# Patient Record
Sex: Female | Born: 1985 | Race: Black or African American | Hispanic: No | Marital: Single | Smoking: Current every day smoker
Health system: Southern US, Community
[De-identification: ages and names within clinical notes are randomized; demographics above are authoritative.]

## PROBLEM LIST (undated history)

## (undated) DIAGNOSIS — B379 Candidiasis, unspecified: Secondary | ICD-10-CM

## (undated) DIAGNOSIS — N898 Other specified noninflammatory disorders of vagina: Secondary | ICD-10-CM

## (undated) DIAGNOSIS — N921 Excessive and frequent menstruation with irregular cycle: Secondary | ICD-10-CM

## (undated) DIAGNOSIS — F419 Anxiety disorder, unspecified: Secondary | ICD-10-CM

## (undated) DIAGNOSIS — I2699 Other pulmonary embolism without acute cor pulmonale: Secondary | ICD-10-CM

## (undated) DIAGNOSIS — R760 Raised antibody titer: Secondary | ICD-10-CM

## (undated) HISTORY — DX: Other specified noninflammatory disorders of vagina: N89.8

## (undated) HISTORY — DX: Excessive and frequent menstruation with irregular cycle: N92.1

## (undated) HISTORY — DX: Candidiasis, unspecified: B37.9

## (undated) HISTORY — PX: WISDOM TOOTH EXTRACTION: SHX21

---
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
## 2004-06-09 ENCOUNTER — Emergency Department (HOSPITAL_COMMUNITY): Admission: EM | Admit: 2004-06-09 | Discharge: 2004-06-09 | Payer: Self-pay | Admitting: *Deleted

## 2004-07-28 ENCOUNTER — Emergency Department (HOSPITAL_COMMUNITY): Admission: EM | Admit: 2004-07-28 | Discharge: 2004-07-28 | Payer: Self-pay | Admitting: Emergency Medicine

## 2004-08-24 ENCOUNTER — Ambulatory Visit (HOSPITAL_COMMUNITY): Admission: RE | Admit: 2004-08-24 | Discharge: 2004-08-24 | Payer: Self-pay | Admitting: Obstetrics & Gynecology

## 2004-09-10 ENCOUNTER — Inpatient Hospital Stay (HOSPITAL_COMMUNITY): Admission: RE | Admit: 2004-09-10 | Discharge: 2004-09-12 | Payer: Self-pay | Admitting: Obstetrics & Gynecology

## 2005-09-06 ENCOUNTER — Emergency Department (HOSPITAL_COMMUNITY): Admission: EM | Admit: 2005-09-06 | Discharge: 2005-09-06 | Payer: Self-pay | Admitting: Emergency Medicine

## 2005-11-01 ENCOUNTER — Emergency Department (HOSPITAL_COMMUNITY): Admission: EM | Admit: 2005-11-01 | Discharge: 2005-11-02 | Payer: Self-pay | Admitting: *Deleted

## 2006-01-21 ENCOUNTER — Emergency Department (HOSPITAL_COMMUNITY): Admission: EM | Admit: 2006-01-21 | Discharge: 2006-01-21 | Payer: Self-pay | Admitting: Emergency Medicine

## 2006-03-16 ENCOUNTER — Emergency Department (HOSPITAL_COMMUNITY): Admission: EM | Admit: 2006-03-16 | Discharge: 2006-03-16 | Payer: Self-pay | Admitting: Emergency Medicine

## 2006-03-28 ENCOUNTER — Emergency Department (HOSPITAL_COMMUNITY): Admission: EM | Admit: 2006-03-28 | Discharge: 2006-03-28 | Payer: Self-pay | Admitting: Emergency Medicine

## 2006-07-04 ENCOUNTER — Emergency Department (HOSPITAL_COMMUNITY): Admission: EM | Admit: 2006-07-04 | Discharge: 2006-07-04 | Payer: Self-pay | Admitting: Emergency Medicine

## 2007-02-23 ENCOUNTER — Emergency Department (HOSPITAL_COMMUNITY): Admission: EM | Admit: 2007-02-23 | Discharge: 2007-02-23 | Payer: Self-pay | Admitting: Emergency Medicine

## 2007-09-12 ENCOUNTER — Other Ambulatory Visit: Admission: RE | Admit: 2007-09-12 | Discharge: 2007-09-12 | Payer: Self-pay | Admitting: Obstetrics and Gynecology

## 2008-07-03 ENCOUNTER — Emergency Department (HOSPITAL_COMMUNITY): Admission: EM | Admit: 2008-07-03 | Discharge: 2008-07-03 | Payer: Self-pay | Admitting: Emergency Medicine

## 2009-01-06 ENCOUNTER — Emergency Department (HOSPITAL_COMMUNITY): Admission: EM | Admit: 2009-01-06 | Discharge: 2009-01-06 | Payer: Self-pay | Admitting: Emergency Medicine

## 2009-12-13 ENCOUNTER — Emergency Department (HOSPITAL_COMMUNITY): Admission: EM | Admit: 2009-12-13 | Discharge: 2009-12-13 | Payer: Self-pay | Admitting: Emergency Medicine

## 2010-01-10 ENCOUNTER — Emergency Department (HOSPITAL_COMMUNITY): Admission: EM | Admit: 2010-01-10 | Discharge: 2010-01-10 | Payer: Self-pay | Admitting: Emergency Medicine

## 2010-03-10 ENCOUNTER — Emergency Department (HOSPITAL_COMMUNITY): Admission: EM | Admit: 2010-03-10 | Discharge: 2010-03-10 | Payer: Self-pay | Admitting: Emergency Medicine

## 2010-05-25 ENCOUNTER — Emergency Department (HOSPITAL_COMMUNITY)
Admission: EM | Admit: 2010-05-25 | Discharge: 2010-05-25 | Payer: Self-pay | Source: Home / Self Care | Admitting: Emergency Medicine

## 2010-08-19 ENCOUNTER — Emergency Department (HOSPITAL_COMMUNITY)
Admission: EM | Admit: 2010-08-19 | Discharge: 2010-08-19 | Disposition: A | Discharge status: Home / Self Care | Payer: Self-pay | Attending: Emergency Medicine | Admitting: Emergency Medicine

## 2010-08-19 DIAGNOSIS — R05 Cough: Secondary | ICD-10-CM | POA: Insufficient documentation

## 2010-08-19 DIAGNOSIS — R059 Cough, unspecified: Secondary | ICD-10-CM | POA: Insufficient documentation

## 2010-08-19 DIAGNOSIS — J019 Acute sinusitis, unspecified: Secondary | ICD-10-CM | POA: Insufficient documentation

## 2010-09-16 LAB — URINALYSIS, ROUTINE W REFLEX MICROSCOPIC
Glucose, UA: NEGATIVE mg/dL
Hgb urine dipstick: NEGATIVE
Leukocytes, UA: NEGATIVE
Nitrite: NEGATIVE
Protein, ur: 100 mg/dL — AB
Specific Gravity, Urine: >1.03 — ABNORMAL HIGH (ref 1.005–1.030)
Urobilinogen, UA: 1 mg/dL (ref 0.0–1.0)
pH: 5.5 (ref 5.0–8.0)

## 2010-09-16 LAB — URINE MICROSCOPIC-ADD ON

## 2010-09-16 LAB — PREGNANCY, URINE: Preg Test, Ur: NEGATIVE

## 2010-09-21 LAB — BASIC METABOLIC PANEL
Calcium: 9.1 mg/dL (ref 8.4–10.5)
GFR calc Af Amer: >60 mL/min (ref >60)
GFR calc non Af Amer: >60 mL/min (ref >60)
Glucose, Bld: 96 mg/dL (ref 70–99)
Sodium: 135 mEq/L (ref 135–145)

## 2010-09-21 LAB — DIFFERENTIAL
Basophils Relative: 1 % (ref 0–1)
Monocytes Relative: 5 % (ref 3–12)
Neutro Abs: 4.2 10*3/uL (ref 1.7–7.7)
Neutrophils Relative %: 65 % (ref 43–77)

## 2010-09-21 LAB — URINALYSIS, ROUTINE W REFLEX MICROSCOPIC
Nitrite: NEGATIVE
Specific Gravity, Urine: 1.025 (ref 1.005–1.030)
Urobilinogen, UA: 0.2 mg/dL (ref 0.0–1.0)

## 2010-09-21 LAB — POCT PREGNANCY, URINE: Preg Test, Ur: NEGATIVE

## 2010-09-21 LAB — URINE MICROSCOPIC-ADD ON

## 2010-09-21 LAB — URINE CULTURE: Colony Count: 45000

## 2010-09-21 LAB — CBC
Hemoglobin: 14.1 g/dL (ref 12.0–15.0)
MCHC: 33.5 g/dL (ref 30.0–36.0)
RBC: 4.72 MIL/uL (ref 3.87–5.11)

## 2010-09-21 LAB — ETHANOL: Alcohol, Ethyl (B): <5 mg/dL (ref 0–10)

## 2010-09-21 LAB — RAPID URINE DRUG SCREEN, HOSP PERFORMED: Tetrahydrocannabinol: POSITIVE — AB

## 2010-09-22 LAB — DIFFERENTIAL
Basophils Absolute: 0 10*3/uL (ref 0.0–0.1)
Basophils Relative: 1 % (ref 0–1)
Eosinophils Absolute: 0.1 10*3/uL (ref 0.0–0.7)
Eosinophils Relative: 1 % (ref 0–5)
Lymphocytes Relative: 37 % (ref 12–46)
Lymphs Abs: 2.4 10*3/uL (ref 0.7–4.0)
Monocytes Absolute: 0.3 10*3/uL (ref 0.1–1.0)
Monocytes Relative: 5 % (ref 3–12)
Neutro Abs: 3.6 10*3/uL (ref 1.7–7.7)
Neutrophils Relative %: 55 % (ref 43–77)

## 2010-09-22 LAB — POCT CARDIAC MARKERS: Myoglobin, poc: 90.7 ng/mL (ref 12–200)

## 2010-09-22 LAB — BASIC METABOLIC PANEL
BUN: 6 mg/dL (ref 6–23)
CO2: 26 mEq/L (ref 19–32)
Calcium: 9 mg/dL (ref 8.4–10.5)
Creatinine, Ser: 0.8 mg/dL (ref 0.4–1.2)
GFR calc Af Amer: >60 mL/min (ref >60)
Glucose, Bld: 81 mg/dL (ref 70–99)

## 2010-09-22 LAB — CBC
HCT: 39 % (ref 36.0–46.0)
Hemoglobin: 13.2 g/dL (ref 12.0–15.0)
MCHC: 33.8 g/dL (ref 30.0–36.0)
MCV: 88.7 fL (ref 78.0–100.0)
Platelets: 246 10*3/uL (ref 150–400)
RBC: 4.4 MIL/uL (ref 3.87–5.11)
RDW: 12.7 % (ref 11.5–15.5)
WBC: 6.4 10*3/uL (ref 4.0–10.5)

## 2010-09-22 LAB — D-DIMER, QUANTITATIVE: D-Dimer, Quant: 0.23 ug/mL-FEU (ref 0.00–0.48)

## 2010-10-20 ENCOUNTER — Emergency Department (HOSPITAL_COMMUNITY)
Admission: EM | Admit: 2010-10-20 | Discharge: 2010-10-20 | Discharge status: Against Medical Advice | Payer: Self-pay | Attending: Emergency Medicine | Admitting: Emergency Medicine

## 2010-10-20 DIAGNOSIS — R112 Nausea with vomiting, unspecified: Secondary | ICD-10-CM | POA: Insufficient documentation

## 2010-11-21 NOTE — Op Note (Signed)
NAME:  Holly Hodge, Holly Hodge                            ACCOUNT NO.:  [id number]   MEDICAL RECORD NO.:  [id number]                   PATIENT TYPE:  INP   LOCATION:  A410                                 FACILITY:  [facility]   PHYSICIAN:  Lazaro Arms, M.D.                DATE OF BIRTH:  1985-09-22   DATE OF PROCEDURE:  07/24/2003  DATE OF DISCHARGE:                                 OPERATIVE REPORT   PREOPERATIVE DIAGNOSES:  1. Intrauterine pregnancy at 40 weeks and 1 day.  2. Fetal macrosomia.  3. Unfavorable cervix with fetal vertex out of the pelvis.   POSTOPERATIVE DIAGNOSES:  1. Intrauterine pregnancy at 40 weeks and 1 day.  2. Fetal macrosomia.  3. Unfavorable cervix with fetal vertex out of the pelvis.   PROCEDURE:  Primary low transverse cesarean section.   SURGEON:  Lazaro Arms, M.D.   ANESTHESIA:  Spinal.   FINDINGS:  Over a low transverse hysterotomy incision was delivered a viable  female infant at 66 with Apgars of 8 and 9 weighing 11 pounds 1 ounce.  There is a  3-vessel cord.  Cord gas and cord blood were sent.  The placenta  was somewhat calcified.  It was sent to pathology because of fetal  macrosomia and calcifications.  The uterus tubes and ovaries were otherwise  normal. Upon delivering the infant there was a lot of bleeding in the lower  segment at a large venous complex; and actually made a small laceration in  the left temple approximately, 0.5 cm that was not bleeding.  Dr. Milford Cage is  simply going to Steri-Strip it.  I offered Dermabond, but he felt that it  was not necessary.   DESCRIPTION OF OPERATION:  The patient was taken to the operating room and  placed in the sitting position where she underwent a spinal anesthetic.  She  was then placed in the supine position with a roll under her right hip.  She  was prepped and draped in the usual sterile fashion.  A Foley catheter was  placed.  A Pfannenstiel skin incision was made and carried down sharply  through  the rectus fascia which scored in the midline and extended  laterally.  The fascia was taken off the muscles superiorly and inferiorly  without difficulty.  The muscles were divided, the peritoneal cavity was  entered and the bladder blade was placed.  The vesicouterine serosal flap  was created.   A low transverse hysterotomy incision made.  There was a large venous  complex found in this area and it was quite bloody.  Over this incision was  delivered a viable female infant at 54 with Apgars of 8 and 9, weighing 11  pounds and 1 ounce.  There was a 3-vessel cord.  Dr. Milford Cage was in attendance  for routine neonatal resuscitation.   The placenta was delivered spontaneously.  Cord gas  and cord blood were  sent.  The uterus was exteriorized and closed in two layers; the first being  a running interlocking layer and the second being an imbricating layer.  There was good hemostasis.  The pelvis was irrigated vigorously.  The  muscles were reapproximated loosely using #0  chromic.  The fascia was closed using #0 Vicryl.  The subcutaneous tissue  was made hemostatic and irrigated.  The skin was closed using skin staples.  The patient tolerated the procedure well.  She experienced 700 cc of blood  loss and was taken to the recovery room in good stable condition.  All  counts were correct x3.      ___________________________________________                                            Lazaro Arms, M.D.   LHE/MEDQ  D:  07/24/2003  T:  07/24/2003  Job:  [id number]

## 2010-11-21 NOTE — Op Note (Signed)
Holly Hodge, Holly Hodge                  ACCOUNT NO.:  [id number]   MEDICAL RECORD NO.:  [id number]          PATIENT TYPE:  INP   LOCATION:  A428                          FACILITY:  [facility]   PHYSICIAN:  Lazaro Arms, M.D.   DATE OF BIRTH:  1985-10-10   DATE OF PROCEDURE:  09/10/2004  DATE OF DISCHARGE:                                 OPERATIVE REPORT   PREOPERATIVE DIAGNOSIS:  1.  Intrauterine pregnancy at 39-2/[redacted] weeks gestation.  2.  Previous Cesarean section.   POSTOPERATIVE DIAGNOSIS:  1.  Intrauterine pregnancy at 39-2/[redacted] weeks gestation.  2.  Previous Cesarean section.   PROCEDURE:  Repeat Cesarean section, low transverse.   SURGEON:  Dr. Despina Hidden.   ANESTHESIA:  Spinal.   FINDINGS:  Over a low transverse hysterotomy incision was delivered a viable  female infant, Apgars of 9 and 9, weight to be determined in the nursery. I  used a vacuum extractor for assistance. There was three-vessel cord. Cord  blood and cord gases were sent. Placenta was normal and intact. Uterus,  tubes, and ovaries were normal.   DESCRIPTION OF OPERATION:  The patient was taken to the operating room,  placed in the sitting position where she underwent a spinal anesthetic. She  was then prepped and draped in the usual sterile fashion. A Pfannenstiel  skin incision was made and carried sharply to the rectus fascia which was  scored in the midline and extended laterally. The fascia was taken off the  muscles superiorly and inferiorly without difficulty. The muscles were  divided. Peritoneal cavity was entered, and bladder blade was placed. The  vesicouterine serosal flap was created. Low transverse hysterotomy incision  was made, and over this incision was delivered a viable female infant with  Apgars of 9 and 9 with weight to be determined in the nursery. The infant  was handed to Dr. Milford Cage who was in attendance for routine neonatal  resuscitation. Placenta was delivered intact. Cord blood and cord gases were  sent. The uterus, tubes, and ovaries were normal. The uterus was  exteriorized. Uterus was closed in 2 layers, first being a running  interlocking layer, the second being imbricating layer. There was good  hemostasis. The uterus was replaced in the peritoneal cavity. The pelvis was  irrigated vigorously with good hemostasis of all pedicles. The muscles were  reapproximated loosely, the fascia closed using a 0 PDS looped. Subcutaneous  tissue was made hemostatic and  irrigated. JP drain was placed. The subcu was brought together loosely with  plain suture. Skin was closed using skin staples. The patient tolerated the  procedure. She experienced 500 cc of blood loss. Was taken to the recovery  room in good, stable condition. All counts correct x3.      LHE/MEDQ  D:  09/10/2004  T:  09/10/2004  Job:  [id number]

## 2010-11-21 NOTE — H&P (Signed)
NAME:  Holly Hodge, Holly Hodge                            ACCOUNT NO.:  [id number]   MEDICAL RECORD NO.:  [id number]                  PATIENT TYPE:   LOCATION:                                       FACILITY:  [facility]   PHYSICIAN:  Lazaro Arms, M.D.                DATE OF BIRTH:  03-Oct-1985   DATE OF ADMISSION:  DATE OF DISCHARGE:                                HISTORY & PHYSICAL   She is 25 years old, gravida 1, para 0 with estimated date of delivery of  July 23, 2003 currently at 40 weeks 1 day gestation. Her cervix is long,  thick and closed and the baby is out of the pelvis.  We did an estimated  fetal weight here at [facility], last week, and it is over 4000  grams.  As a result she is not an induction candidate.  We offered her to  proceed with a cesarean section for a fetal macrosomia and she decided to do  that.  As a result she is admitted for primary C-section.   PAST MEDICAL HISTORY:  Past medical history is negative.   PAST SURGICAL HISTORY:  Past surgical history is negative.   PAST OBSTETRICAL HISTORY:  She is nulliparous.   ALLERGIES:  None.   MEDICATIONS:  Prenatal vitamins and iron.   Blood type is O positive, rubella is immune.  HIV is nonreactive.  Hepatitis  B is negative.  RPR is negative.  Pap was normal.  GC and Chlamydia were  normal.  Glucola was 87.  AFP was normal.   PHYSICAL EXAMINATION:  HEENT:  Unremarkable.  NECK:  Thyroid is normal.  LUNGS:  Clear.  HEART:  Regular rate and rhythm without murmur, regurgitation or gallop.  BREASTS:  Without masses or discharge or skin changes.  ABDOMEN:  Fundal height of 44 cm.  PELVIC:  Cervix long thick and closed.  Fetal vertex out of the pelvis.  EXTREMITIES:  Warm with no edema.   IMPRESSION:  1. Intrauterine pregnancy at 40+ weeks gestation.  2. Fetal macrosomia.  Estimated fetal weight of greater than 4000 grams.  3. Fetal vertex out of the pelvis, unfavorably cervix.   PLAN:  The patient admitted  for primary C-section.  She understands the  risks, benefits, indications and alternatives.  Will proceed.     ___________________________________________                                         Lazaro Arms, M.D.   Loraine Maple  D:  07/24/2003  T:  07/24/2003  Job:  [id number]

## 2010-11-21 NOTE — Discharge Summary (Signed)
Holly Hodge, Holly Hodge                  ACCOUNT NO.:  [id number]   MEDICAL RECORD NO.:  [id number]          PATIENT TYPE:  OIB   LOCATION:  LDR5                          FACILITY:  [facility]   PHYSICIAN:  Langley Gauss, MD     DATE OF BIRTH:  Dec 14, 1985   DATE OF ADMISSION:  08/24/2004  DATE OF DISCHARGE:  02/19/2006LH                                 DISCHARGE SUMMARY   HISTORY OF PRESENT ILLNESS:  A 25 year old gravida 2, para 1-1, prior lower  transverse cesarean section at about [redacted] weeks gestation, who is noted to  have episodes of nausea and vomiting due to post nasal drip.  The patient  states that she does have both a productive and non-productive cough, as  well as some sinus drainage and sinus tenderness.  She states it is after  she eats when she has vomiting and frequently vomits in the middle of the  night but she is convinced this is occurs in relationship to the post nasal  drip of phlegm.  She occasionally has some tightness in the chest and  wheezing, which rapidly clears with the productive cough.  The patient had  an appointment with [facility] the previous week for these upper  respiratory symptoms but was unable to keep the appointment.   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  Urine drug screen positive for marijuana.  One prior low transverse cesarean  section for an 11 pound 1 ounce infant.   PHYSICAL EXAMINATION:  GENERAL:  An obese black female in no acute distress.  It looks as though she has a very poor exercise tolerance.  ABDOMEN:  Soft and nontender with a fundal height of 40 cm.  She is vertex  presentation by Leopold's maneuvers.  LUNGS:  Noted to be clear.  CARDIOVASCULAR:  Regular rate and rhythm.   ASSESSMENT/PLAN:  The patient with an upper respiratory infection with post  nasal drainage resulting in some vomiting.  The patient is treated  symptomatically with Z-pack, as well as some Tussionex.  She is given  instructions to follow up with [facility] this week to assess for  improvement with this therapeutic regimen.   ADDENDUM:  Fetal heart tones are documented in the 140s-150s.  No uterine  activity by external toco.      DC/MEDQ  D:  08/24/2004  T:  08/25/2004  Job:  [id number]

## 2010-11-21 NOTE — H&P (Signed)
NAME:  Holly Hodge, Holly Hodge                            ACCOUNT NO.:  [id number]   MEDICAL RECORD NO.:  [id number]                   PATIENT TYPE:  AMB   LOCATION:  DAY                                  FACILITY:  [facility]   PHYSICIAN:  Dalia Heading, M.D.               DATE OF BIRTH:  08-Jul-1985   DATE OF ADMISSION:  DATE OF DISCHARGE:                                HISTORY & PHYSICAL   CHIEF COMPLAINT:  Biliary colic, cholelithiasis.   HISTORY OF PRESENT ILLNESS:  The patient is an 25 year old black female who  is referred for evaluation and treatment of biliary colic secondary to  cholelithiasis. She has been having intermittent episodes of right upper  quadrant abdominal pain with radiation to the right flank, nausea, and  bloating for many weeks. She does have fatty food intolerance. No fever,  chills, or jaundice have been noted. Her symptoms have been worsening.   PAST MEDICAL HISTORY:  Unremarkable.   PAST SURGICAL HISTORY:  Cesarean section by Dr. Despina Hidden in 2005.   CURRENT MEDICATIONS:  None.   ALLERGIES:  No known drug allergies.   REVIEW OF SYSTEMS:  The patient does smoke daily. She denies any alcohol  use.   PHYSICAL EXAMINATION:  GENERAL:  The patient is an obese, black female,  weighing 258 in no acute distress. She is afebrile, and vital signs are  stable.  HEENT:  Reveals no sclerae icterus.  LUNGS:  Clear to auscultation with equal breath sounds bilaterally.  HEART:  Reveals regular rate and rhythm without S3, S4, or murmurs.  ABDOMEN:  The abdomen is soft with slight tenderness in the right upper  quadrant to palpation. No hepatosplenomegaly, masses, or hernias are  identified.   STUDIES:  Ultrasound of the gallbladder reveals cholelithiasis.   IMPRESSION:  Biliary colic, cholelithiasis.   PLAN:  The patient is scheduled for laparoscopic cholecystectomy on December 26, 2003. The risks and benefits of the procedure including bleeding, infection,  hepatobiliary injury,  and the possibility of an open procedure were fully  explained to the patient who gave informed consent.     ___________________________________________                                         Dalia Heading, M.D.   MAJ/MEDQ  D:  12/25/2003  T:  12/25/2003  Job:  [id number]   cc:   Patrica Duel, M.D.  [address], [address]  [place]  [place] [address]  Fax: [phone]

## 2010-11-21 NOTE — Discharge Summary (Signed)
NAMEBUNNY, KLEIST                  ACCOUNT NO.:  [id number]   MEDICAL RECORD NO.:  [id number]          PATIENT TYPE:  INP   LOCATION:  A428                          FACILITY:  [facility]   PHYSICIAN:  Lazaro Arms, M.D.   DATE OF BIRTH:  1986-05-01   DATE OF ADMISSION:  09/10/2004  DATE OF DISCHARGE:  03/10/2006LH                                 DISCHARGE SUMMARY   DISCHARGE DIAGNOSES:  1.  Status post repeat cesarean section.  2.  Unremarkable postoperative course.   PROCEDURE:  Repeat cesarean section.   HOSPITAL COURSE:  Please refer to the handwritten History and Physical for  details of admission to the hospital.  Please refer to the Operative Note  for details of the surgery.  The patient was admitted after surgery. She  tolerated clear liquids and a regular diet.  She was without symptoms and  was ambulatory.  Her incision was clean, dry, and intact.  On postop day #1,  hemoglobin was 11.5, hematocrit 33.3, white count was normal at 11,000.  Her  abdominal exam was benign.  She was ambulatory.  She tolerated oral pain  medicine.  She was discharged to home on postop day #2 in good stable  condition to follow up in the office next week to have her staples removed.  Her incision was clean, dry, and intact.  She was given Tylox and Motrin for  pain.  Again, to follow up Wednesday.      LHE/MEDQ  D:  10/29/2004  T:  10/29/2004  Job:  [id number]

## 2010-11-21 NOTE — Op Note (Signed)
NAME:  Holly Hodge, Holly Hodge                            ACCOUNT NO.:  [id number]   MEDICAL RECORD NO.:  [id number]                   PATIENT TYPE:  AMB   LOCATION:  DAY                                  FACILITY:  [facility]   PHYSICIAN:  Dalia Heading, M.D.               DATE OF BIRTH:  19-Jun-1986   DATE OF PROCEDURE:  DATE OF DISCHARGE:                                 OPERATIVE REPORT   PREOPERATIVE DIAGNOSIS:  Cholecystitis, cholelithiasis.   POSTOPERATIVE DIAGNOSIS:  Cholecystitis, cholelithiasis.   PROCEDURE:  Laparoscopic cholecystectomy.   SURGEON:  Dalia Heading, M.D.   ASSISTANT:  Buena Irish, M.D.   ANESTHESIA:  General endotracheal.   INDICATIONS:  The patient is an 25 year old black female who presents with  cholecystitis secondary to cholelithiasis.  The risks and benefits of the  procedure including bleeding, infection, hepatobiliary injury, and the  possibility of an open procedure were fully explained to the patient, who  gave informed consent.   DESCRIPTION OF PROCEDURE:  The patient was placed in the supine position.  After induction of general endotracheal anesthesia, the abdomen was prepped  and draped using the usual sterile technique with Betadine.  Surgical site  confirmation was performed.   A supraumbilical incision was made down to the fascia.  A Veress needle was  introduced into the abdominal cavity and confirmation of placement was done  using the saline drop test.  The abdomen was then insufflated to 16 mmHg  pressure.  An 11-mm trocar was introduced into the abdominal cavity under  direct visualization without difficulty.  The patient was placed then in  reverse Trendelenburg position and an additional 11-mm trocar was placed in  the epigastric region and 5-mm trocars were placed in the right upper  quadrant and right flank regions. The liver was inspected and noted to be  within normal limits.   The gallbladder was retracted superiorly and  laterally.  The dissection was  begun around the infundibulum of the gallbladder.  The cystic duct was first  identified.  Its juncture to the infundibulum fully identified.  Endoclips  were placed proximally and distally on the cystic duct; and the cystic duct  was divided.  This was likewise done on the cystic artery.  The gallbladder  was then freed away from the gallbladder fossa using Bovie electrocautery.  The gallbladder was delivered through the epigastric trocar site using an  EndoCatch bag.  The gallbladder fossa was inspected and no abnormal bleeding  or bile leakage was noted.  Surgicel was placed in the gallbladder fossa.  All fluid and air were then evacuated from the abdominal cavity prior to  removal of the trocars.   All wounds were irrigated with normal saline.  All wounds were injected with  0.5% Sensorcaine.  The supraumbilical fascia was reapproximated using an #0  Vicryl interrupted suture. All skin incisions were closed using staples.  Betadine ointment and dry sterile dressings were applied.   All tape and needle counts were correct at the end of the procedure.  The  patient was extubated in the operating room and went back to recovery room  awake and stable condition.   COMPLICATIONS:  None.   SPECIMENS:  Gallbladder with stones.   BLOOD LOSS:  Minimal.      ___________________________________________                                            Dalia Heading, M.D.   MAJ/MEDQ  D:  12/26/2003  T:  12/27/2003  Job:  [id number]   cc:   Patrica Duel, M.D.  [address]  [place]  [place] [address]  Fax: [phone]

## 2010-11-21 NOTE — H&P (Signed)
NAMEARGIE, LOBER                  ACCOUNT NO.:  [id number]   MEDICAL RECORD NO.:  [id number]          PATIENT TYPE:  AMB   LOCATION:  DAY                           FACILITY:  [facility]   PHYSICIAN:  Lazaro Arms, M.D.   DATE OF BIRTH:  27-Aug-1985   DATE OF ADMISSION:  09/11/2004  DATE OF DISCHARGE:  LH                                HISTORY & PHYSICAL   Holly Hodge is a 25 year old African-American female, gravida 2, para 1, estimated  date of delivery of September 17, 2004, currently at 39-2/[redacted] weeks gestation  admitted for repeat C section.  We did a primary C section in 2005 without  labor because estimated fetal weight was greater than 4500 g and the fetus  was out of pelvis and weighed 11 pounds 1 ounce.  The patient is admitted  for repeat C section.   PAST MEDICAL HISTORY:  Negative.   PAST SURGICAL HISTORY:  1.  C section.  2.  Cholecystectomy.   PAST OB HISTORY:  As above.   ALLERGIES:  None,.   MEDICATIONS:  Prenatal vitamins.   LABORATORY DATA AND OTHER STUDIES:  Blood type is O positive.  Antibody  screen is negative.  Rubella is immune.  Hepatitis B is negative.  HIV was  negative.  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  HSV-2 was negative.  Serology was  nonreactive.  Pap was normal.  GC and chlamydia were both negative.  She was  too late for AFP.  Glucola was normal at 91.  Group B strep culture was  negative.   PHYSICAL EXAMINATION:  HEENT:  Unremarkable.  NECK: Thyroid normal.  LUNGS:  Clear.  HEART:  Regular rate and rhythm without murmur, regurgitation, or gallop.  BREASTS:  Without mass, discharge, skin changes.  ABDOMEN:  Fundal height 43 cm.  PELVIC:  Cervix long, thick, and closed  EXTREMITIES:  Warm with no edema.  NEUROLOGIC:  Grossly intact.   IMPRESSION:  1.  Intrauterine pregnancy at 39+ weeks gestation.  2.  Previous cesarean section.   PLAN:  The patient is admitted for repeat cesarean section.  She understands  the risks, benefits, indications, and alternatives and  will proceed.      LHE/MEDQ  D:  09/09/2004  T:  09/09/2004  Job:  [id number]

## 2010-11-21 NOTE — Procedures (Signed)
Holly Hodge, OLIVEIRA                  ACCOUNT NO.:  [id number]   MEDICAL RECORD NO.:  [id number]          PATIENT TYPE:  EMS   LOCATION:  ED                            FACILITY:  [facility]   PHYSICIAN:  Edward L. Juanetta Gosling, M.D.DATE OF BIRTH:  [birth year]   DATE OF PROCEDURE:  11/01/2005  DATE OF DISCHARGE:  11/01/2005                                EKG INTERPRETATION   2107.  The rhythm is a supraventricular bigeminy.  There is left ventricular  hypertrophy.  T-wave abnormalities are seen anteriorly which could indicate  ischemia, but are more likely related to LVH.  Abnormal electrocardiogram.      Oneal Deputy. Juanetta Gosling, M.D.  Electronically Signed     ELH/MEDQ  D:  11/02/2005  T:  11/03/2005  Job:  [id number]

## 2011-06-02 ENCOUNTER — Emergency Department (HOSPITAL_COMMUNITY)
Admission: EM | Admit: 2011-06-02 | Discharge: 2011-06-02 | Disposition: A | Discharge status: Home / Self Care | Payer: Medicaid Other | Attending: Emergency Medicine | Admitting: Emergency Medicine

## 2011-06-02 ENCOUNTER — Encounter: Payer: Self-pay | Admitting: Emergency Medicine

## 2011-06-02 DIAGNOSIS — K089 Disorder of teeth and supporting structures, unspecified: Secondary | ICD-10-CM | POA: Insufficient documentation

## 2011-06-02 DIAGNOSIS — K0889 Other specified disorders of teeth and supporting structures: Secondary | ICD-10-CM

## 2011-06-02 DIAGNOSIS — F17200 Nicotine dependence, unspecified, uncomplicated: Secondary | ICD-10-CM | POA: Insufficient documentation

## 2011-06-02 DIAGNOSIS — R51 Headache: Secondary | ICD-10-CM | POA: Insufficient documentation

## 2011-06-02 HISTORY — DX: Anxiety disorder, unspecified: F41.9

## 2011-06-02 MED ORDER — ISOMETHEPTENE-APAP-DICHLORAL 65-325-100 MG PO CAPS: 1.0000 | ORAL_CAPSULE | Freq: Four times a day (QID) | ORAL | Status: AC | PRN

## 2011-06-02 MED ORDER — NAPROXEN 500 MG PO TABS: 500.0000 mg | ORAL_TABLET | Freq: Two times a day (BID) | ORAL | Status: DC

## 2011-06-02 NOTE — ED Provider Notes (Signed)
History  This chart was scribed for Holly Kras, MD by Bennett Scrape. This patient was seen in room APA11/APA11 and the patient's care was started at 7:45AM.  CSN: [id number] Arrival date & time: 06/02/2011  7:42 AM   First MD Initiated Contact with Patient 06/02/11 0732      Chief Complaint  Patient presents with  . Headache  . Dental Pain    HPI  Holly Hodge is a 25 y.o. female who presents to the Emergency Department complaining of 3 days of a gradual onset, constant moderate headache. Pt has not seen anyone for the headache and reports not taking any pain medication because "they usually don't help". Pt also complains of 2 hours of a mild dental pain on her right side that she feels may be related to the headache. Pt reports having a h/o headaches. She denies no numbness and weakness, fevers and vomiting. Pt states that her sister has a h/o migraines. Nothing seems to make the headache particularly worse.  Past Medical History  Diagnosis Date  . Anxiety     Past Surgical History  Procedure Date  . Cholecystectomy   . Cesarean section     x2    Family History  Problem Relation Age of Onset  . Hypertension Mother   . Hypertension Father   . Stroke Father   . Cancer Other     History  Substance Use Topics  . Smoking status: Current Everyday Smoker -- 0.5 packs/day for 6 years    Types: Cigarettes  . Smokeless tobacco: Never Used  . Alcohol Use: No     occasionally-holidays    OB History    Grav Para Term Preterm Abortions TAB SAB Ect Mult Living   2 2 2       2       Review of Systems A complete 10 system review of systems was obtained and is otherwise negative except as noted in the HPI .   Allergies  Review of patient's allergies indicates no known allergies.  Home Medications  No current outpatient prescriptions on file.  Triage Vitals: BP 131/72  Pulse 84  Resp 16  Ht 5\' 5"  (1.651 m)  Wt 234 lb (106.142 kg)  BMI 38.94 kg/m2  SpO2 99%  LMP  04/25/2011  Physical Exam  Nursing note and vitals reviewed. Constitutional: She is oriented to person, place, and time. She appears well-developed and well-nourished. No distress.  HENT:  Head: Normocephalic and atraumatic.  Right Ear: External ear normal.  Left Ear: External ear normal.  Mouth/Throat: Oropharynx is clear and moist.       Normal dentition, mild tenderness to the right mandible  Eyes: Conjunctivae are normal. Right eye exhibits no discharge. Left eye exhibits no discharge. No scleral icterus.  Neck: Neck supple. No tracheal deviation present.  Cardiovascular: Normal rate, regular rhythm and intact distal pulses.   Pulmonary/Chest: Effort normal and breath sounds normal. No stridor. No respiratory distress. She has no wheezes. She has no rales.  Abdominal: Soft. Bowel sounds are normal. She exhibits no distension. There is no tenderness. There is no rebound and no guarding.  Musculoskeletal: She exhibits no edema and no tenderness.       Good strength in all extremities.  Neurological: She is alert and oriented to person, place, and time. She has normal strength. No sensory deficit. Cranial nerve deficit:  no gross defecits noted. She exhibits normal muscle tone. She displays no seizure activity. Coordination normal.  No pronator drift bilateral upper extrem, able to hold both legs off bed for 5 seconds, sensation intact in all extremities, no visual field cuts, no left or right sided neglect  Skin: Skin is warm and dry. No rash noted.  Psychiatric: She has a normal mood and affect.    ED Course  Procedures (including critical care time)  DIAGNOSTIC STUDIES: Oxygen Saturation is 99% on room air, normal by my interpretation.    COORDINATION OF CARE: 7:48AM-Discussed treatment plan with patient at bedside and patient agreed to plan.   Labs Reviewed - No data to display No results found.   1. Toothache   2. Headache       MDM  Patient with moderate  headache. There are no findings to suggest stroke, hemorrhage, tumor. Patient is a mild dental pain. I do not see any obvious dental abnormality I have encouraged her to follow up with dentist. The patient does have history of headache she could have migraines. I will prescribe Naprosyn and Midrin.  Patient followup with primary-care doctor. Discharge junctions include warning signs that should prompt return to emergency department      I personally performed the services described in this documentation, which was scribed in my presence.  The recorded information has been reviewed and considered.   Holly Kras, MD 06/02/11 [phone]

## 2011-06-02 NOTE — ED Notes (Signed)
Patient c/o headache since Saturday.  Per patient nervous with blurred vision. Patient also c/o right side dental pain that started this morning. Denies any nausea or vomiting.

## 2011-07-07 DIAGNOSIS — I2699 Other pulmonary embolism without acute cor pulmonale: Secondary | ICD-10-CM

## 2011-07-07 HISTORY — DX: Other pulmonary embolism without acute cor pulmonale: I26.99

## 2011-10-06 ENCOUNTER — Encounter (HOSPITAL_COMMUNITY): Payer: Self-pay | Admitting: Emergency Medicine

## 2011-10-06 ENCOUNTER — Emergency Department (HOSPITAL_COMMUNITY)
Admission: EM | Admit: 2011-10-06 | Discharge: 2011-10-06 | Disposition: A | Discharge status: Home / Self Care | Payer: Medicaid Other | Attending: Emergency Medicine | Admitting: Emergency Medicine

## 2011-10-06 DIAGNOSIS — F17200 Nicotine dependence, unspecified, uncomplicated: Secondary | ICD-10-CM | POA: Insufficient documentation

## 2011-10-06 DIAGNOSIS — K089 Disorder of teeth and supporting structures, unspecified: Secondary | ICD-10-CM | POA: Insufficient documentation

## 2011-10-06 DIAGNOSIS — K0889 Other specified disorders of teeth and supporting structures: Secondary | ICD-10-CM

## 2011-10-06 MED ORDER — IBUPROFEN 800 MG PO TABS
800.0000 mg | ORAL_TABLET | Freq: Once | ORAL | Status: AC
  Administered 2011-10-06: 800 mg via ORAL
  Filled 2011-10-06: qty 1

## 2011-10-06 MED ORDER — HYDROCODONE-ACETAMINOPHEN 5-325 MG PO TABS: 1.0000 | ORAL_TABLET | Freq: Four times a day (QID) | ORAL | Status: AC | PRN

## 2011-10-06 MED ORDER — PENICILLIN V POTASSIUM 500 MG PO TABS: 500.0000 mg | ORAL_TABLET | Freq: Four times a day (QID) | ORAL | Status: AC

## 2011-10-06 MED ORDER — PENICILLIN V POTASSIUM 250 MG PO TABS
500.0000 mg | ORAL_TABLET | Freq: Once | ORAL | Status: AC
  Administered 2011-10-06: 500 mg via ORAL
  Filled 2011-10-06: qty 2

## 2011-10-06 NOTE — ED Notes (Signed)
Pt c/o bilateral lower toothaches

## 2011-10-06 NOTE — ED Provider Notes (Signed)
History     CSN: [id number]  Arrival date & time 10/06/11  1028   First MD Initiated Contact with Patient 10/06/11 1035      Chief Complaint  Patient presents with  . Dental Pain    (Consider location/radiation/quality/duration/timing/severity/associated sxs/prior treatment) HPI Comments: Seen by her dentist 1 week ago and had cleaning.  Has another appt 10-18-11 for "deeper cleaning".  Her x-rays reportedly revealed the need for excision of all her wisdom teeth and her dentist is going to refer to an oral surgeon.  Patient is a 26 y.o. female presenting with tooth pain. The history is provided by the patient. No language interpreter was used.  Dental PainThe primary symptoms include mouth pain. Primary symptoms do not include fever. The symptoms began more than 1 week ago. The symptoms are worsening. The symptoms occur constantly.  Additional symptoms do not include: facial swelling.    Past Medical History  Diagnosis Date  . Anxiety     Past Surgical History  Procedure Date  . Cholecystectomy   . Cesarean section     x2    Family History  Problem Relation Age of Onset  . Hypertension Mother   . Hypertension Father   . Stroke Father   . Cancer Other     History  Substance Use Topics  . Smoking status: Current Everyday Smoker -- 0.5 packs/day for 6 years    Types: Cigarettes  . Smokeless tobacco: Never Used  . Alcohol Use: No     occasionally-holidays    OB History    Grav Para Term Preterm Abortions TAB SAB Ect Mult Living   2 2 2       2       Review of Systems  Constitutional: Negative for fever.  HENT: Positive for dental problem. Negative for facial swelling.   All other systems reviewed and are negative.    Allergies  Review of patient's allergies indicates no known allergies.  Home Medications   Current Outpatient Rx  Name Route Sig Dispense Refill  . HYDROCODONE-ACETAMINOPHEN 5-325 MG PO TABS Oral Take 1 tablet by mouth every 6 (six) hours  as needed for pain. 20 tablet 0  . NAPROXEN 500 MG PO TABS Oral Take 1 tablet (500 mg total) by mouth 2 (two) times daily. 30 tablet 0  . PENICILLIN V POTASSIUM 500 MG PO TABS Oral Take 1 tablet (500 mg total) by mouth 4 (four) times daily. 40 tablet 0    BP 112/67  Pulse 77  Temp 98.9 F (37.2 C)  Resp 20  Ht 5\' 4"  (1.626 m)  Wt 232 lb (105.235 kg)  BMI 39.82 kg/m2  SpO2 100%  LMP 09/09/2011  Physical Exam  Nursing note and vitals reviewed. Constitutional: She is oriented to person, place, and time. She appears well-developed and well-nourished. No distress.  HENT:  Head: Normocephalic and atraumatic.  Mouth/Throat: Uvula is midline, oropharynx is clear and moist and mucous membranes are normal. Normal dentition. No dental abscesses, uvula swelling or dental caries.       Dentition appears normal and teeth in good repair.  Pt localizes pain to all wisdom teeth  Eyes: EOM are normal.  Neck: Normal range of motion.  Cardiovascular: Normal rate, regular rhythm and normal heart sounds.   Pulmonary/Chest: Effort normal and breath sounds normal.  Abdominal: Soft. She exhibits no distension. There is no tenderness.  Musculoskeletal: Normal range of motion.  Neurological: She is alert and oriented to person, place, and time.  Skin: Skin is warm and dry.  Psychiatric: She has a normal mood and affect. Judgment normal.    ED Course  Procedures (including critical care time)  Labs Reviewed - No data to display No results found.   1. Pain, dental       MDM  rx-hydrocodone rx-pen VK, 40 OTC ibuprofen 800 TID F/u with dentist ASAP        Worthy Rancher, PA 10/06/11 1103  Worthy Rancher, [place] 10/06/11 1106

## 2011-10-06 NOTE — ED Provider Notes (Signed)
History/physical exam/procedure(s) were performed by non-physician practitioner and as supervising physician I was immediately available for consultation/collaboration. I have reviewed all notes and am in agreement with care and plan.   Hilario Quarry, MD 10/06/11 [phone]

## 2011-10-06 NOTE — ED Notes (Signed)
Pt presents with bilateral lower wisdom teeth pain x 3 weeks. Pt states has a dentist and is waiting a referral to an oral surgeon.  Denies fever and inability to eat. Minimal bilateral swelling noted in lower jaw and neck.

## 2011-10-06 NOTE — Discharge Instructions (Signed)
Dental Pain A tooth ache may be caused by cavities (tooth decay). Cavities expose the nerve of the tooth to air and hot or cold temperatures. It may come from an infection or abscess (also called a boil or furuncle) around your tooth. It is also often caused by dental caries (tooth decay). This causes the pain you are having. DIAGNOSIS  Your caregiver can diagnose this problem by exam. TREATMENT   If caused by an infection, it may be treated with medications which kill germs (antibiotics) and pain medications as prescribed by your caregiver. Take medications as directed.   Only take over-the-counter or prescription medicines for pain, discomfort, or fever as directed by your caregiver.   Whether the tooth ache today is caused by infection or dental disease, you should see your dentist as soon as possible for further care.  SEEK MEDICAL CARE IF: The exam and treatment you received today has been provided on an emergency basis only. This is not a substitute for complete medical or dental care. If your problem worsens or new problems (symptoms) appear, and you are unable to meet with your dentist, call or return to this location. SEEK IMMEDIATE MEDICAL CARE IF:   You have a fever.   You develop redness and swelling of your face, jaw, or neck.   You are unable to open your mouth.   You have severe pain uncontrolled by pain medicine.  MAKE SURE YOU:   Understand these instructions.   Will watch your condition.   Will get help right away if you are not doing well or get worse.  Document Released: 06/22/2005 Document Revised: 06/11/2011 Document Reviewed: 02/08/2008 [facility] Patient Information 2012 [place], [place].  Take the meds as directed.  Take ibuprofen 800 mg every 8 hrs with food.  Follow up with your dentist ASAP.

## 2011-12-12 ENCOUNTER — Encounter (HOSPITAL_COMMUNITY): Payer: Self-pay

## 2011-12-12 ENCOUNTER — Emergency Department (HOSPITAL_COMMUNITY)
Admission: EM | Admit: 2011-12-12 | Discharge: 2011-12-12 | Disposition: A | Discharge status: Home / Self Care | Payer: Medicaid Other | Attending: Emergency Medicine | Admitting: Emergency Medicine

## 2011-12-12 ENCOUNTER — Emergency Department (HOSPITAL_COMMUNITY): Payer: Medicaid Other

## 2011-12-12 DIAGNOSIS — F17200 Nicotine dependence, unspecified, uncomplicated: Secondary | ICD-10-CM | POA: Insufficient documentation

## 2011-12-12 DIAGNOSIS — R1110 Vomiting, unspecified: Secondary | ICD-10-CM | POA: Insufficient documentation

## 2011-12-12 DIAGNOSIS — R110 Nausea: Secondary | ICD-10-CM

## 2011-12-12 DIAGNOSIS — R109 Unspecified abdominal pain: Secondary | ICD-10-CM

## 2011-12-12 LAB — CBC
Hemoglobin: 13.5 g/dL (ref 12.0–15.0)
MCH: 30.1 pg (ref 26.0–34.0)
MCV: 87.5 fL (ref 78.0–100.0)
RBC: 4.49 MIL/uL (ref 3.87–5.11)
WBC: 7.4 10*3/uL (ref 4.0–10.5)

## 2011-12-12 LAB — COMPREHENSIVE METABOLIC PANEL
ALT: 61 U/L — ABNORMAL HIGH (ref 0–35)
AST: 20 U/L (ref 0–37)
CO2: 23 mEq/L (ref 19–32)
Calcium: 9.9 mg/dL (ref 8.4–10.5)
Chloride: 101 mEq/L (ref 96–112)
GFR calc non Af Amer: 85 mL/min — ABNORMAL LOW (ref >90)
Sodium: 133 mEq/L — ABNORMAL LOW (ref 135–145)
Total Bilirubin: 0.2 mg/dL — ABNORMAL LOW (ref 0.3–1.2)

## 2011-12-12 LAB — URINALYSIS, ROUTINE W REFLEX MICROSCOPIC
Bilirubin Urine: NEGATIVE
Glucose, UA: NEGATIVE mg/dL
Ketones, ur: NEGATIVE mg/dL
Leukocytes, UA: NEGATIVE
Specific Gravity, Urine: 1.025 (ref 1.005–1.030)
pH: 6 (ref 5.0–8.0)

## 2011-12-12 MED ORDER — FAMOTIDINE 20 MG PO TABS
40.0000 mg | ORAL_TABLET | Freq: Once | ORAL | Status: AC
  Administered 2011-12-12: 40 mg via ORAL
  Filled 2011-12-12: qty 2

## 2011-12-12 MED ORDER — GI COCKTAIL ~~LOC~~
30.0000 mL | Freq: Once | ORAL | Status: AC
  Administered 2011-12-12: 30 mL via ORAL
  Filled 2011-12-12: qty 30

## 2011-12-12 MED ORDER — ONDANSETRON HCL 4 MG PO TABS: 4.0000 mg | ORAL_TABLET | Freq: Three times a day (TID) | ORAL | Status: AC | PRN

## 2011-12-12 NOTE — ED Notes (Signed)
Complaining of vomiting and bouts of constipation

## 2011-12-12 NOTE — ED Provider Notes (Signed)
History   This chart was scribed for Holly Anger, DO scribed by [employer]. The patient was seen in room APA19/APA19 seen at 16:22   CSN: [id number]  Arrival date & time 12/12/11  1546   First MD Initiated Contact with Patient 12/12/11 1615      Chief Complaint  Patient presents with  . Emesis     HPI Holly Hodge is a 26 y.o. female who presents to the Emergency Department complaining of gradual onset and persistence of constant upper abd "pain" since yesterday.  Has been associated with nausea.  Describes the pain as "pressure" in her upper abd area that makes her feel like she has to urinate.  Denies dysuria/hematuria, no vaginal bleeding/discharge, no CP/SOB, no cough, no vomiting/diarrhea, no back/flank pain, no fevers, no rash.    Past Medical History  Diagnosis Date  . Anxiety     Past Surgical History  Procedure Date  . Cesarean section     x2  . Cholecystectomy 2005    Family History  Problem Relation Age of Onset  . Hypertension Mother   . Hypertension Father   . Stroke Father   . Cancer Other     History  Substance Use Topics  . Smoking status: Current Everyday Smoker -- 0.5 packs/day for 6 years    Types: Cigarettes  . Smokeless tobacco: Never Used  . Alcohol Use: No     occasionally-holidays    OB History    Grav Para Term Preterm Abortions TAB SAB Ect Mult Living   2 2 2       2       Review of Systems ROS: Statement: All systems negative except as marked or noted in the HPI; Constitutional: Negative for fever and chills. ; ; Eyes: Negative for eye pain, redness and discharge. ; ; ENMT: Negative for ear pain, hoarseness, nasal congestion, sinus pressure and sore throat. ; ; Cardiovascular: Negative for chest pain, palpitations, diaphoresis, dyspnea and peripheral edema. ; ; Respiratory: Negative for cough, wheezing and stridor. ; ; Gastrointestinal: +nausea, abd pain. Negative for vomiting, diarrhea, blood in stool, hematemesis, jaundice  and rectal bleeding. . ; ; Genitourinary: Negative for dysuria, flank pain and hematuria.; GYN:  No vaginal bleeding, no vaginal discharge, no vulvar pain.; ; Musculoskeletal: Negative for back pain and neck pain. Negative for swelling and trauma.; ; Skin: Negative for pruritus, rash, abrasions, blisters, bruising and skin lesion.; ; Neuro: Negative for headache, lightheadedness and neck stiffness. Negative for weakness, altered level of consciousness , altered mental status, extremity weakness, paresthesias, involuntary movement, seizure and syncope.     Allergies  Review of patient's allergies indicates no known allergies.  Home Medications   Current Outpatient Rx  Name Route Sig Dispense Refill  . NORETHINDRONE-ETH ESTRADIOL 0.4-35 MG-MCG PO TABS Oral Take 1 tablet by mouth daily.      BP 112/67  Pulse 78  Temp(Src) 98.8 F (37.1 C) (Oral)  Resp 20  Ht 5\' 4"  (1.626 m)  Wt 228 lb (103.42 kg)  BMI 39.14 kg/m2  SpO2 96%  LMP 12/09/2011  Physical Exam 1620: Physical examination:  Nursing notes reviewed; Vital signs and O2 SAT reviewed;  Constitutional: Well developed, Well nourished, Well hydrated, In no acute distress; Head:  Normocephalic, atraumatic; Eyes: EOMI, PERRL, No scleral icterus; ENMT: Mouth and pharynx normal, Mucous membranes moist; Neck: Supple, Full range of motion, No lymphadenopathy; Cardiovascular: Regular rate and rhythm, No murmur, rub, or gallop; Respiratory: Breath sounds clear &  equal bilaterally, No rales, rhonchi, wheezes, speaking full sentences with ease.  Normal respiratory effort/excursion; Chest: Nontender, Movement normal; Abdomen: Soft, +very mild mid-epigastric tenderness to palp, no rebound or guarding. Nondistended, Normal bowel sounds; Genitourinary: No CVA tenderness; Extremities: Pulses normal, No tenderness, No edema, No calf edema or asymmetry.; Neuro: AA&Ox3, Major CN grossly intact.  No gross focal motor or sensory deficits in extremities.; Skin:  Color normal, Warm, Dry.   ED Course  Procedures    MDM  MDM Reviewed: nursing note and vitals Interpretation: labs and x-ray   Results for orders placed during the hospital encounter of 12/12/11  URINALYSIS, ROUTINE W REFLEX MICROSCOPIC      Component Value Range   Color, Urine YELLOW  YELLOW    APPearance HAZY (*) CLEAR    Specific Gravity, Urine 1.025  1.005 - 1.030    pH 6.0  5.0 - 8.0    Glucose, UA NEGATIVE  NEGATIVE (mg/dL)   Hgb urine dipstick NEGATIVE  NEGATIVE    Bilirubin Urine NEGATIVE  NEGATIVE    Ketones, ur NEGATIVE  NEGATIVE (mg/dL)   Protein, ur NEGATIVE  NEGATIVE (mg/dL)   Urobilinogen, UA 0.2  0.0 - 1.0 (mg/dL)   Nitrite NEGATIVE  NEGATIVE    Leukocytes, UA NEGATIVE  NEGATIVE   PREGNANCY, URINE      Component Value Range   Preg Test, Ur NEGATIVE  NEGATIVE   CBC      Component Value Range   WBC 7.4  4.0 - 10.5 (K/uL)   RBC 4.49  3.87 - 5.11 (MIL/uL)   Hemoglobin 13.5  12.0 - 15.0 (g/dL)   HCT 16.1  09.6 - 04.5 (%)   MCV 87.5  78.0 - 100.0 (fL)   MCH 30.1  26.0 - 34.0 (pg)   MCHC 34.4  30.0 - 36.0 (g/dL)   RDW 40.9  81.1 - 91.4 (%)   Platelets 272  150 - 400 (K/uL)  COMPREHENSIVE METABOLIC PANEL      Component Value Range   Sodium 133 (*) 135 - 145 (mEq/L)   Potassium 3.9  3.5 - 5.1 (mEq/L)   Chloride 101  96 - 112 (mEq/L)   CO2 23  19 - 32 (mEq/L)   Glucose, Bld 86  70 - 99 (mg/dL)   BUN 9  6 - 23 (mg/dL)   Creatinine, Ser 7.82  0.50 - 1.10 (mg/dL)   Calcium 9.9  8.4 - 95.6 (mg/dL)   Total Protein 7.5  6.0 - 8.3 (g/dL)   Albumin 3.7  3.5 - 5.2 (g/dL)   AST 20  0 - 37 (U/L)   ALT 61 (*) 0 - 35 (U/L)   Alkaline Phosphatase 71  39 - 117 (U/L)   Total Bilirubin 0.2 (*) 0.3 - 1.2 (mg/dL)   GFR calc non Af Amer 85 (*) >90 (mL/min)   GFR calc Af Amer >90  >90 (mL/min)  LIPASE, BLOOD      Component Value Range   Lipase 23  11 - 59 (U/L)    Dg Abd Acute W/chest 12/12/2011  *RADIOLOGY REPORT*  Clinical Data: Epigastric pain, lower pelvic pain   ACUTE ABDOMEN SERIES (ABDOMEN 2 VIEW & CHEST 1 VIEW)  Comparison: None.  Findings: Cardiomediastinal silhouette is stable. No acute infiltrate or pleural effusion.  No pulmonary edema. There is nonspecific nonobstructive bowel gas pattern.  No free abdominal air.  IMPRESSION: No active disease.  Nonspecific nonobstructive bowel gas pattern. No free abdominal air.  Original Report Authenticated By: Lang Snow POP,  M.D.     6:44 PM:  Mild ALT elevation, no old to compare.  WBC, Tbili, lipase all normal; and pt is already s/p cholecystectomy in 2005.  VSS, afebrile.  Has tol PO well while in ED without N/V.  No stooling while in ED.  Wants to go home now.  Dx testing d/w pt.  Questions answered.  Verb understanding, agreeable to d/c home with outpt f/u.             I personally performed the services described in this documentation, which was scribed in my presence. The recorded information has been reviewed and considered. Everton Bertha Allison Quarry, DO 12/14/11 1803

## 2011-12-12 NOTE — ED Notes (Signed)
States she does not feel she will be able to work [employer]

## 2011-12-12 NOTE — Discharge Instructions (Signed)
RESOURCE GUIDE  Chronic Pain Problems: Contact [facility]  [phone] Patients need to be referred by their primary care doctor.  Insufficient Money for Medicine: Contact United Way:  call "211" or [facility] [phone].  No Primary Care Doctor: - Call [facility]  [phone] - can help you locate a primary care doctor that  accepts your insurance, provides certain services, etc. - Physician Referral Service- 1-800-533-3463  Agencies that provide inexpensive medical care: - [facility]  [phone] - [facility]  [phone] - [facility]  [phone] - [facility]  [phone] - Planned Parenthood  [phone] - [facility]  [phone]  Medicaid-accepting [place] Providers: - [facility]- [address]  [phone], Mon-Fri 9am-7pm, Sat 9am-1pm - [facility]- [address], [address]  [phone] - [facility]- [address], [address]  [phone] - [facility]- [address] [place] [address]  [phone] - Veita Bland- [address], [phone]  Only accepts [place] Access Medicaid patients after they have their name  applied to their card  Self Pay (no insurance) in [place]: - Sickle Cell Patients: Dr Eric Dean, [facility]  [address], [phone] - [facility]- [address]  [phone]       -     [facility]- [address]       -     [facility]- see information above (Speak to Pam H if you do not have insurance)       -  [facility]- [address], [phone]       -  [facility] [place]- [address],  [phone]       -  [facility]- [address] [place] [address], [phone]       -  Dr Osei-Bonsu-  [address], [place], [phone]       -  [facility]- [address], [phone]       -  [facility]- [address] [place] [address], [phone], also [address], [phone]       -    [facility]- [address], [phone], 1st & 3rd Saturday   every month, 10am-1pm  1) Find a Doctor and Pay Out of Pocket Although you won't have to find out who is covered by your insurance plan, it is a good idea to ask around and get recommendations. You will then need to call the office and see if the doctor you have chosen will accept you as a new patient and what types of options they offer for patients who are self-pay. Some doctors offer discounts or will set up payment plans for their patients who do not have insurance, but you will need to ask so you aren't surprised when you get to your appointment.  2) Contact Your Local Health Department Not all health departments have doctors that can see patients for sick visits, but many do, so it is worth a call to see if yours does. If you don't know where your local health department is, you can check in your phone book. The CDC also has a tool to help you locate your state's health department, and many state websites also have   listings of all of their local health departments.  3) Find a Walk-in Clinic If your illness is not likely to be very severe or complicated, you may want to try a walk in clinic. These are popping up all over the country in pharmacies, drugstores, and shopping centers. They're usually staffed by nurse practitioners or physician assistants that have been trained to treat common illnesses and complaints. They're usually fairly quick and inexpensive. However, if you have serious medical issues or chronic medical problems, these are probably not your best option  STD Testing - [place] [facility] [place], [facility], [address], [place], phone [phone] or 1-877-539-9860.  Monday - Friday, call for an appointment. - [place]  [facility] [place], [facility] Clinic, [address], [place], phone [phone] or 1-877-539-9860.  Monday - Friday, call for an appointment.  Abuse/Neglect: - [place] Child Abuse Hotline (336) 641-3795 - [place] Child Abuse Hotline 800-378-5315 (After Hours)  Emergency Shelter:  [facility] (336) 271-5985  Maternity Homes: - [facility] (336) 275-9566 - [facility] (704) 372-4663  MRSA Hotline #:   832-7006  [place] Resources  [facility] [place]  [employer] [place] [facility] [address]                 [address]         [address]  [place]                                               [place]                              [place] Phone:  [phone]                                  Phone:  [phone]                   Phone:  [phone]  [place] [facility], [phone] - [place] Services - [facility]- 1-888-581-9988       -     [facility] in [place], [address],                                  336-349-4454, Insurance  [place] Child Abuse Hotline (336) 342-1394 or (336) 342-3537 (After Hours)   Behavioral Health Services  Substance Abuse Resources: - [facility]  336-882-2125 - [facility] 336-784-9470 - [facility] 336-285-9073 - [facility] 336-845-3988 - Residential & Outpatient Substance Abuse Program  800-659-3381  Psychological Services: -  Health  [phone] - [facility]  [phone] - [place] [facility], [address], [place], ACCESS LINE: 1-800-853-5163 or 336-641-4981, Http://www.[facility].com/services/adult.htm  Dental Assistance  If unable to pay or uninsured, contact:  [facility] or [place] [facility] to become qualified for the adult dental  clinic.  Patients with Medicaid:  [facility] [address], [phone], [phone]  If unable   to pay, or uninsured, contact [facility] 907-014-4041) or [facility] Department [phone] in [place], [phone] in [facility]) to become qualified for the adult dental clinic  Other Low-Cost Community Dental Services: - [facility]- [address] [place], [place], [place], [address], [phone], [phone], 2nd and 4th Thursday of the month at 6:30am.  10 clients each day by appointment, can sometimes see walk-in patients if someone does not show for an appointment. [facility]- [address] [place], [place], [address], [phone] - [facility]- [address], [place], [place], [address], [phone] [facility] Health Department- [phone] [facility] Health Department- [phone] [facility] [phone]    Eat a bland diet, avoiding greasy, fatty, fried foods, as well as spicy and acidic foods or beverages.  Avoid eating within the hour or 2 before going to bed or laying down.  Also avoid teas, colas, coffee, chocolate, pepermint and spearment.  Take over the counter pepcid, one tablet by mouth twice a day, for the next 2 to 3 weeks.  May also take over the counter maalox/mylanta, as directed on packaging, as needed for discomfort.  Take the prescription as directed.  Call your regular medical doctor on Monday to schedule a follow up appointment this week.  Return to the Emergency Department immediately if worsening.

## 2011-12-12 NOTE — ED Notes (Signed)
Patient given sprite per request for fluid challenge, tolerating well. Denies any nausea at this time.

## 2012-01-07 ENCOUNTER — Emergency Department (HOSPITAL_COMMUNITY)
Admission: EM | Admit: 2012-01-07 | Discharge: 2012-01-08 | Disposition: A | Discharge status: Home / Self Care | Payer: Medicaid Other | Attending: Emergency Medicine | Admitting: Emergency Medicine

## 2012-01-07 ENCOUNTER — Encounter (HOSPITAL_COMMUNITY): Payer: Self-pay | Admitting: Emergency Medicine

## 2012-01-07 DIAGNOSIS — K219 Gastro-esophageal reflux disease without esophagitis: Secondary | ICD-10-CM

## 2012-01-07 DIAGNOSIS — F17200 Nicotine dependence, unspecified, uncomplicated: Secondary | ICD-10-CM | POA: Insufficient documentation

## 2012-01-07 DIAGNOSIS — R110 Nausea: Secondary | ICD-10-CM

## 2012-01-07 DIAGNOSIS — F411 Generalized anxiety disorder: Secondary | ICD-10-CM | POA: Insufficient documentation

## 2012-01-07 LAB — COMPREHENSIVE METABOLIC PANEL
Alkaline Phosphatase: 77 U/L (ref 39–117)
BUN: 11 mg/dL (ref 6–23)
CO2: 23 mEq/L (ref 19–32)
Chloride: 103 mEq/L (ref 96–112)
Creatinine, Ser: 0.94 mg/dL (ref 0.50–1.10)
GFR calc non Af Amer: 83 mL/min — ABNORMAL LOW (ref >90)
Glucose, Bld: 86 mg/dL (ref 70–99)
Potassium: 3.5 mEq/L (ref 3.5–5.1)
Total Bilirubin: 0.2 mg/dL — ABNORMAL LOW (ref 0.3–1.2)

## 2012-01-07 LAB — CBC WITH DIFFERENTIAL/PLATELET
HCT: 38.4 % (ref 36.0–46.0)
Hemoglobin: 12.9 g/dL (ref 12.0–15.0)
Lymphocytes Relative: 41 % (ref 12–46)
Lymphs Abs: 3.2 10*3/uL (ref 0.7–4.0)
MCHC: 33.6 g/dL (ref 30.0–36.0)
Monocytes Absolute: 0.4 10*3/uL (ref 0.1–1.0)
Monocytes Relative: 5 % (ref 3–12)
Neutro Abs: 4.1 10*3/uL (ref 1.7–7.7)
RBC: 4.34 MIL/uL (ref 3.87–5.11)

## 2012-01-07 LAB — URINALYSIS, ROUTINE W REFLEX MICROSCOPIC
Glucose, UA: NEGATIVE mg/dL
Hgb urine dipstick: NEGATIVE
Protein, ur: NEGATIVE mg/dL
pH: 6 (ref 5.0–8.0)

## 2012-01-07 LAB — LIPASE, BLOOD: Lipase: 27 U/L (ref 11–59)

## 2012-01-07 LAB — PREGNANCY, URINE: Preg Test, Ur: NEGATIVE

## 2012-01-07 MED ORDER — ONDANSETRON HCL 4 MG/2ML IJ SOLN
4.0000 mg | Freq: Once | INTRAMUSCULAR | Status: AC
  Administered 2012-01-07: 4 mg via INTRAVENOUS
  Filled 2012-01-07: qty 2

## 2012-01-07 MED ORDER — SODIUM CHLORIDE 0.9 % IV BOLUS (SEPSIS)
500.0000 mL | Freq: Once | INTRAVENOUS | Status: AC
  Administered 2012-01-07: 500 mL via INTRAVENOUS

## 2012-01-07 MED ORDER — PANTOPRAZOLE SODIUM 40 MG IV SOLR
40.0000 mg | Freq: Once | INTRAVENOUS | Status: AC
  Administered 2012-01-07: 40 mg via INTRAVENOUS
  Filled 2012-01-07: qty 40

## 2012-01-07 NOTE — ED Notes (Signed)
Patient states she ate a chicken sandwich at [employer] today; states every time she belches that it has a bad smell and makes her vomit due to the smell.  Patient states her stomach has been feeling queasy.

## 2012-01-07 NOTE — ED Notes (Signed)
Iva Lento, PA at bedside to evaluated patient.

## 2012-01-07 NOTE — ED Provider Notes (Signed)
History     CSN: [id number]  Arrival date & time 01/07/12  2138   First MD Initiated Contact with Patient 01/07/12 2221      Chief Complaint  Patient presents with  . Nausea  . Emesis    (Consider location/radiation/quality/duration/timing/severity/associated sxs/prior treatment) HPI Comments: Patient c/o nausea, frequent belching and burning sensation to her upper abdomen since noon today.  States she ate a chicken sandwich from a [employer] and the symptoms developed shortly after that.  Also states that her stomach "feels bloated".  She denies abdominal pain, vomiting, diarrhea or urinary symptoms.  States that she took one OTC antiacid with some relief.  Hx of cholecyctectomy  The history is provided by the patient.    Past Medical History  Diagnosis Date  . Anxiety     Past Surgical History  Procedure Date  . Cesarean section     x2  . Cholecystectomy 2005    Family History  Problem Relation Age of Onset  . Hypertension Mother   . Hypertension Father   . Stroke Father   . Cancer Other     History  Substance Use Topics  . Smoking status: Current Everyday Smoker -- 0.5 packs/day for 6 years    Types: Cigarettes  . Smokeless tobacco: Never Used  . Alcohol Use: No     occasionally-holidays    OB History    Grav Para Term Preterm Abortions TAB SAB Ect Mult Living   2 2 2       2       Review of Systems  Constitutional: Negative for fever, activity change and appetite change.  HENT: Negative for sore throat.   Respiratory: Negative for cough and shortness of breath.   Cardiovascular: Negative for chest pain.  Gastrointestinal: Positive for nausea and abdominal distention. Negative for vomiting, abdominal pain, diarrhea and constipation.       Belching  Musculoskeletal: Negative for back pain.  Skin: Negative.   Neurological: Negative for dizziness, weakness, numbness and headaches.  All other systems reviewed and are negative.    Allergies    Review of patient's allergies indicates no known allergies.  Home Medications   Current Outpatient Rx  Name Route Sig Dispense Refill  . ALUM & MAG HYDROXIDE-SIMETH 200-200-20 MG/5ML PO SUSP Oral Take 10 mLs by mouth once as needed. For upset stomach    . NORETHINDRONE-ETH ESTRADIOL 0.4-35 MG-MCG PO TABS Oral Take 1 tablet by mouth daily.      BP 114/66  Pulse 79  Resp 20  Ht 5\' 5"  (1.651 m)  Wt 227 lb (102.967 kg)  BMI 37.77 kg/m2  SpO2 97%  LMP 12/31/2011  Physical Exam  Nursing note and vitals reviewed. Constitutional: She is oriented to person, place, and time. She appears well-developed and well-nourished. No distress.  HENT:  Head: Normocephalic and atraumatic.  Mouth/Throat: Oropharynx is clear and moist.  Cardiovascular: Normal rate, regular rhythm, normal heart sounds and intact distal pulses.   No murmur heard. Pulmonary/Chest: Breath sounds normal.  Abdominal: Soft. Bowel sounds are normal. She exhibits no distension and no mass. There is no tenderness. There is no rebound and no guarding.  Musculoskeletal: Normal range of motion.  Neurological: She is alert and oriented to person, place, and time. Coordination normal.  Skin: Skin is warm and dry.    ED Course  Procedures (including critical care time)  Results for orders placed during the hospital encounter of 01/07/12  URINALYSIS, ROUTINE W REFLEX MICROSCOPIC  Component Value Range   Color, Urine YELLOW  YELLOW   APPearance CLEAR  CLEAR   Specific Gravity, Urine >1.030 (*) 1.005 - 1.030   pH 6.0  5.0 - 8.0   Glucose, UA NEGATIVE  NEGATIVE mg/dL   Hgb urine dipstick NEGATIVE  NEGATIVE   Bilirubin Urine NEGATIVE  NEGATIVE   Ketones, ur NEGATIVE  NEGATIVE mg/dL   Protein, ur NEGATIVE  NEGATIVE mg/dL   Urobilinogen, UA 0.2  0.0 - 1.0 mg/dL   Nitrite NEGATIVE  NEGATIVE   Leukocytes, UA NEGATIVE  NEGATIVE  PREGNANCY, URINE      Component Value Range   Preg Test, Ur NEGATIVE  NEGATIVE  CBC WITH  DIFFERENTIAL      Component Value Range   WBC 7.8  4.0 - 10.5 K/uL   RBC 4.34  3.87 - 5.11 MIL/uL   Hemoglobin 12.9  12.0 - 15.0 g/dL   HCT 40.9  81.1 - 91.4 %   MCV 88.5  78.0 - 100.0 fL   MCH 29.7  26.0 - 34.0 pg   MCHC 33.6  30.0 - 36.0 g/dL   RDW 78.2  95.6 - 21.3 %   Platelets 290  150 - 400 K/uL   Neutrophils Relative 51  43 - 77 %   Neutro Abs 4.1  1.7 - 7.7 K/uL   Lymphocytes Relative 41  12 - 46 %   Lymphs Abs 3.2  0.7 - 4.0 K/uL   Monocytes Relative 5  3 - 12 %   Monocytes Absolute 0.4  0.1 - 1.0 K/uL   Eosinophils Relative 2  0 - 5 %   Eosinophils Absolute 0.1  0.0 - 0.7 K/uL   Basophils Relative 1  0 - 1 %   Basophils Absolute 0.1  0.0 - 0.1 K/uL  COMPREHENSIVE METABOLIC PANEL      Component Value Range   Sodium 138  135 - 145 mEq/L   Potassium 3.5  3.5 - 5.1 mEq/L   Chloride 103  96 - 112 mEq/L   CO2 23  19 - 32 mEq/L   Glucose, Bld 86  70 - 99 mg/dL   BUN 11  6 - 23 mg/dL   Creatinine, Ser 0.86  0.50 - 1.10 mg/dL   Calcium 9.7  8.4 - 57.8 mg/dL   Total Protein 7.5  6.0 - 8.3 g/dL   Albumin 3.9  3.5 - 5.2 g/dL   AST 26  0 - 37 U/L   ALT 35  0 - 35 U/L   Alkaline Phosphatase 77  39 - 117 U/L   Total Bilirubin 0.2 (*) 0.3 - 1.2 mg/dL   GFR calc non Af Amer 83 (*) >90 mL/min   GFR calc Af Amer >90  >90 mL/min  LIPASE, BLOOD      Component Value Range   Lipase 27  11 - 59 U/L         MDM     Patient is feeling better.  Has drank fluids w/o difficulty.  No vomiting in the ED.  abd remains soft, NT.  No guarding or rebound tenderness.  Sx's likely related to GERD vs gastritis.  I will prescribe pepcid, pt agrees to close f/u with her PMD or to return here if her sx's worsen.    The patient appears reasonably screened and/or stabilized for discharge and I doubt any other medical condition or other [facility] requiring further screening, evaluation, or treatment in the ED at this time prior to discharge.  Patient / Family / Caregiver understand and agree with  initial ED impression and plan with expectations set for ED visit. Pt stable in ED with no significant deterioration in condition. Pt feels improved after observation and/or treatment in ED.       Elide Stalzer L. [place] Evilsizer, PA 01/08/12 0001  Caidance Sybert L. Zahniya Zellars, [place] 01/08/12 0002

## 2012-01-08 MED ORDER — PROMETHAZINE HCL 25 MG PO TABS: 25.0000 mg | ORAL_TABLET | Freq: Four times a day (QID) | ORAL | Status: DC | PRN

## 2012-01-08 MED ORDER — FAMOTIDINE 20 MG PO TABS: 20.0000 mg | ORAL_TABLET | Freq: Two times a day (BID) | ORAL | Status: DC

## 2012-01-08 NOTE — ED Provider Notes (Signed)
Medical screening examination/treatment/procedure(s) were performed by non-physician practitioner and as supervising physician I was immediately available for consultation/collaboration.  Shelda Jakes, MD 01/08/12 [phone]

## 2012-03-08 ENCOUNTER — Encounter (HOSPITAL_COMMUNITY): Payer: Self-pay | Admitting: Emergency Medicine

## 2012-03-08 ENCOUNTER — Inpatient Hospital Stay (HOSPITAL_COMMUNITY)
Admission: EM | Admit: 2012-03-08 | Discharge: 2012-03-12 | DRG: 078 | Disposition: A | Discharge status: Home / Self Care | Payer: BC Managed Care – PPO | Attending: Internal Medicine | Admitting: Internal Medicine

## 2012-03-08 ENCOUNTER — Emergency Department (HOSPITAL_COMMUNITY): Payer: BC Managed Care – PPO

## 2012-03-08 DIAGNOSIS — R112 Nausea with vomiting, unspecified: Secondary | ICD-10-CM | POA: Diagnosis not present

## 2012-03-08 DIAGNOSIS — I2699 Other pulmonary embolism without acute cor pulmonale: Principal | ICD-10-CM | POA: Diagnosis present

## 2012-03-08 DIAGNOSIS — Y9210 Unspecified residential institution as the place of occurrence of the external cause: Secondary | ICD-10-CM | POA: Diagnosis not present

## 2012-03-08 DIAGNOSIS — K7689 Other specified diseases of liver: Secondary | ICD-10-CM | POA: Diagnosis present

## 2012-03-08 DIAGNOSIS — Z6836 Body mass index (BMI) 36.0-36.9, adult: Secondary | ICD-10-CM

## 2012-03-08 DIAGNOSIS — T384X5A Adverse effect of oral contraceptives, initial encounter: Secondary | ICD-10-CM | POA: Diagnosis present

## 2012-03-08 DIAGNOSIS — R760 Raised antibody titer: Secondary | ICD-10-CM | POA: Diagnosis present

## 2012-03-08 DIAGNOSIS — F1210 Cannabis abuse, uncomplicated: Secondary | ICD-10-CM | POA: Diagnosis present

## 2012-03-08 DIAGNOSIS — R894 Abnormal immunological findings in specimens from other organs, systems and tissues: Secondary | ICD-10-CM | POA: Diagnosis present

## 2012-03-08 DIAGNOSIS — Y92009 Unspecified place in unspecified non-institutional (private) residence as the place of occurrence of the external cause: Secondary | ICD-10-CM

## 2012-03-08 DIAGNOSIS — Z720 Tobacco use: Secondary | ICD-10-CM | POA: Diagnosis present

## 2012-03-08 DIAGNOSIS — T40605A Adverse effect of unspecified narcotics, initial encounter: Secondary | ICD-10-CM | POA: Diagnosis not present

## 2012-03-08 DIAGNOSIS — E669 Obesity, unspecified: Secondary | ICD-10-CM | POA: Diagnosis present

## 2012-03-08 DIAGNOSIS — N920 Excessive and frequent menstruation with regular cycle: Secondary | ICD-10-CM | POA: Diagnosis present

## 2012-03-08 DIAGNOSIS — K5900 Constipation, unspecified: Secondary | ICD-10-CM | POA: Diagnosis not present

## 2012-03-08 DIAGNOSIS — F17200 Nicotine dependence, unspecified, uncomplicated: Secondary | ICD-10-CM | POA: Diagnosis present

## 2012-03-08 HISTORY — DX: Raised antibody titer: R76.0

## 2012-03-08 LAB — BASIC METABOLIC PANEL
CO2: 24 mEq/L (ref 19–32)
Calcium: 9.1 mg/dL (ref 8.4–10.5)
Chloride: 104 mEq/L (ref 96–112)
Creatinine, Ser: 0.86 mg/dL (ref 0.50–1.10)
GFR calc Af Amer: >90 mL/min (ref >90)
Sodium: 138 mEq/L (ref 135–145)

## 2012-03-08 LAB — CBC WITH DIFFERENTIAL/PLATELET
Basophils Absolute: 0 10*3/uL (ref 0.0–0.1)
Basophils Relative: 0 % (ref 0–1)
Eosinophils Relative: 1 % (ref 0–5)
Lymphocytes Relative: 25 % (ref 12–46)
MCHC: 34.5 g/dL (ref 30.0–36.0)
Monocytes Absolute: 0.7 10*3/uL (ref 0.1–1.0)
Neutro Abs: 4.4 10*3/uL (ref 1.7–7.7)
Platelets: 239 10*3/uL (ref 150–400)
RDW: 12.4 % (ref 11.5–15.5)
WBC: 7 10*3/uL (ref 4.0–10.5)

## 2012-03-08 LAB — URINALYSIS, ROUTINE W REFLEX MICROSCOPIC
Bilirubin Urine: NEGATIVE
Ketones, ur: NEGATIVE mg/dL
Leukocytes, UA: NEGATIVE
Nitrite: NEGATIVE
Urobilinogen, UA: 0.2 mg/dL (ref 0.0–1.0)
pH: 6 (ref 5.0–8.0)

## 2012-03-08 LAB — D-DIMER, QUANTITATIVE: D-Dimer, Quant: 2.95 ug/mL-FEU — ABNORMAL HIGH (ref 0.00–0.48)

## 2012-03-08 MED ORDER — ACETAMINOPHEN 325 MG PO TABS
650.0000 mg | ORAL_TABLET | Freq: Four times a day (QID) | ORAL | Status: DC | PRN
  Administered 2012-03-09: 650 mg via ORAL
  Filled 2012-03-08 (×2): qty 2

## 2012-03-08 MED ORDER — ACETAMINOPHEN 500 MG PO TABS
1000.0000 mg | ORAL_TABLET | Freq: Once | ORAL | Status: AC
  Administered 2012-03-08: 1000 mg via ORAL
  Filled 2012-03-08: qty 2

## 2012-03-08 MED ORDER — COUMADIN BOOK
Freq: Once | Status: AC
  Administered 2012-03-08: 15:00:00
  Filled 2012-03-08: qty 1

## 2012-03-08 MED ORDER — ADULT MULTIVITAMIN W/MINERALS CH
1.0000 | ORAL_TABLET | Freq: Every day | ORAL | Status: DC
  Administered 2012-03-08 – 2012-03-12 (×5): 1 via ORAL
  Filled 2012-03-08 (×5): qty 1

## 2012-03-08 MED ORDER — HYDROCODONE-ACETAMINOPHEN 5-325 MG PO TABS
1.0000 | ORAL_TABLET | ORAL | Status: DC | PRN
  Administered 2012-03-08 (×2): 1 via ORAL
  Administered 2012-03-08 – 2012-03-09 (×2): 2 via ORAL
  Filled 2012-03-08: qty 2
  Filled 2012-03-08: qty 1
  Filled 2012-03-08 (×2): qty 2
  Filled 2012-03-08: qty 1

## 2012-03-08 MED ORDER — HYDROMORPHONE HCL PF 1 MG/ML IJ SOLN: 1.0000 mg | INTRAMUSCULAR | Status: DC | PRN

## 2012-03-08 MED ORDER — WARFARIN SODIUM 10 MG PO TABS
10.0000 mg | ORAL_TABLET | Freq: Once | ORAL | Status: AC
  Administered 2012-03-08: 10 mg via ORAL
  Filled 2012-03-08: qty 1

## 2012-03-08 MED ORDER — PNEUMOCOCCAL VAC POLYVALENT 25 MCG/0.5ML IJ INJ
0.5000 mL | INJECTION | INTRAMUSCULAR | Status: AC
  Administered 2012-03-09: 0.5 mL via INTRAMUSCULAR
  Filled 2012-03-08: qty 0.5

## 2012-03-08 MED ORDER — MAGNESIUM HYDROXIDE 400 MG/5ML PO SUSP
30.0000 mL | Freq: Every day | ORAL | Status: DC | PRN
  Administered 2012-03-09 – 2012-03-12 (×2): 30 mL via ORAL
  Filled 2012-03-08 (×2): qty 30

## 2012-03-08 MED ORDER — ENOXAPARIN SODIUM 120 MG/0.8ML ~~LOC~~ SOLN
1.0000 mg/kg | Freq: Two times a day (BID) | SUBCUTANEOUS | Status: DC
  Filled 2012-03-08 (×3): qty 0.8

## 2012-03-08 MED ORDER — IOHEXOL 350 MG/ML SOLN
100.0000 mL | Freq: Once | INTRAVENOUS | Status: AC | PRN
  Administered 2012-03-08: 100 mL via INTRAVENOUS

## 2012-03-08 MED ORDER — SODIUM CHLORIDE 0.9 % IV SOLN: INTRAVENOUS | Status: DC

## 2012-03-08 MED ORDER — ONDANSETRON HCL 4 MG/2ML IJ SOLN: 4.0000 mg | Freq: Three times a day (TID) | INTRAMUSCULAR | Status: DC | PRN

## 2012-03-08 MED ORDER — TRAZODONE HCL 50 MG PO TABS: 50.0000 mg | ORAL_TABLET | Freq: Every evening | ORAL | Status: DC | PRN

## 2012-03-08 MED ORDER — SODIUM CHLORIDE 0.9 % IJ SOLN
3.0000 mL | Freq: Two times a day (BID) | INTRAMUSCULAR | Status: DC
  Administered 2012-03-08 – 2012-03-11 (×4): 3 mL via INTRAVENOUS
  Filled 2012-03-08 (×2): qty 3

## 2012-03-08 MED ORDER — ACETAMINOPHEN 650 MG RE SUPP: 650.0000 mg | Freq: Four times a day (QID) | RECTAL | Status: DC | PRN

## 2012-03-08 MED ORDER — ENOXAPARIN SODIUM 120 MG/0.8ML ~~LOC~~ SOLN
1.0000 mg/kg | Freq: Once | SUBCUTANEOUS | Status: AC
  Administered 2012-03-08: 105 mg via SUBCUTANEOUS
  Filled 2012-03-08: qty 0.8

## 2012-03-08 MED ORDER — WARFARIN VIDEO
Freq: Once | Status: AC
  Administered 2012-03-08: 15:00:00

## 2012-03-08 MED ORDER — ENOXAPARIN SODIUM 120 MG/0.8ML ~~LOC~~ SOLN
1.0000 mg/kg | Freq: Two times a day (BID) | SUBCUTANEOUS | Status: DC
  Administered 2012-03-08 – 2012-03-09 (×3): 105 mg via SUBCUTANEOUS
  Filled 2012-03-08 (×6): qty 0.8

## 2012-03-08 MED ORDER — WARFARIN - PHARMACIST DOSING INPATIENT: Freq: Every day | Status: DC

## 2012-03-08 MED ORDER — GUAIFENESIN-DM 100-10 MG/5ML PO SYRP: 5.0000 mL | ORAL_SOLUTION | ORAL | Status: DC | PRN

## 2012-03-08 NOTE — ED Notes (Signed)
Pt resting in bed. Voices no complaints.

## 2012-03-08 NOTE — ED Notes (Signed)
Pt c/o cough/congestion since Saturday with chest/rib/back pain since yesterday. Pain worse with deep breath/cough/position change.

## 2012-03-08 NOTE — ED Notes (Signed)
Pt reports being sick for several days, w/ cold and cough, chest congestion. Chest pain --worse with coughing and movement.

## 2012-03-08 NOTE — Progress Notes (Addendum)
ANTICOAGULATION CONSULT NOTE - Initial Consult  Pharmacy Consult for Coumadin Indication: pulmonary embolus  No Known Allergies  Patient Measurements: Height: 5\' 6"  (167.6 cm) Weight: 227 lb 15.3 oz (103.4 kg) IBW/kg (Calculated) : 59.3   Vital Signs: Temp: 98.5 F (36.9 C) (09/03 1335) Temp src: Oral (09/03 1335) BP: 102/66 mmHg (09/03 1335) Pulse Rate: 72  (09/03 1335)  Labs:  Basename 03/08/12 1017 03/08/12 0829  HGB -- 13.0  HCT -- 37.7  PLT -- 239  APTT 31 --  LABPROT 13.7 --  INR 1.03 --  HEPARINUNFRC -- --  CREATININE -- 0.86  CKTOTAL -- --  CKMB -- --  TROPONINI -- --    Estimated Creatinine Clearance: 120.3 ml/min (by C-G formula based on Cr of 0.86).   Medical History: Past Medical History  Diagnosis Date  . Anxiety     Medications:  Scheduled:    . acetaminophen  1,000 mg Oral Once  . coumadin book   Does not apply Once  . enoxaparin (LOVENOX) injection  1 mg/kg Subcutaneous Once  . enoxaparin (LOVENOX) injection  1 mg/kg Subcutaneous Q12H  . multivitamin with minerals  1 tablet Oral Daily  . pneumococcal 23 valent vaccine  0.5 mL Intramuscular Tomorrow-1000  . sodium chloride  3 mL Intravenous Q12H  . warfarin   Does not apply Once  . DISCONTD: sodium chloride   Intravenous STAT    Assessment: 26 yo F with +PE to start on Lovenox--> Coumadin bridge.  Coumadin score = 10 Normal renal function.  INR at baseline.   Goal of Therapy:  INR 2-3 Monitor platelets by anticoagulation protocol: Yes   Plan:  1) Coumadin 10mg  po x1 dose tonight 2) Daily INR 3) Initiate warfarin education  Elson Clan 03/08/2012,3:15 PM

## 2012-03-08 NOTE — ED Provider Notes (Signed)
History  This chart was scribed for Holly Melter, MD by Holly Hodge. This patient was seen in room APA19/APA19 and the patient's care was started at 0733.   CSN: [id number]  Arrival date & time 03/08/12  1478   First MD Initiated Contact with Patient 03/08/12 [phone]      Chief Complaint  Patient presents with  . Cough  . Chest Pain   Patient is a 26 y.o. female presenting with chest pain. The history is provided by the patient. No language interpreter was used.  Chest Pain Primary symptoms include shortness of breath, cough and nausea. Pertinent negatives for primary symptoms include no abdominal pain and no vomiting.  Pertinent negatives for associated symptoms include no weakness.    Holly Hodge is a 26 y.o. female who presents to the Emergency Department complaining of constant left sided chest pain for 3 days. She states her pain began while she was at work Saturday night and is worse with coughing or laying flat. She states that she had to sit up to help her sleep yesterday. She states mild SOB with exertion. She states she has felt feverish and nauseated but never checked her temperature. She denies any urinary symptoms and her LNMP was the last week of August, she takes currently taking [facility] for heavy menstrual periods.   Past Medical History  Diagnosis Date  . Anxiety     Past Surgical History  Procedure Date  . Cesarean section     x2  . Cholecystectomy 2005    Family History  Problem Relation Age of Onset  . Hypertension Mother   . Hypertension Father   . Stroke Father   . Cancer Other     History  Substance Use Topics  . Smoking status: Current Everyday Smoker -- 0.5 packs/Hodge for 6 years    Types: Cigarettes  . Smokeless tobacco: Never Used  . Alcohol Use: No     occasionally-holidays    OB History    Grav Para Term Preterm Abortions TAB SAB Ect Mult Living   2 2 2       2       Review of Systems  Constitutional: Negative for chills.  HENT: Negative  for congestion.   Eyes: Negative.   Respiratory: Positive for cough and shortness of breath.   Gastrointestinal: Positive for nausea. Negative for vomiting and abdominal pain.  Genitourinary: Negative for dysuria, flank pain and difficulty urinating.  Musculoskeletal: Negative for back pain.  Neurological: Negative for weakness.  All other systems reviewed and are negative.    Allergies  Review of patient's allergies indicates no known allergies.  Home Medications   Current Outpatient Rx  Name Route Sig Dispense Refill  . FAMOTIDINE 20 MG PO TABS Oral Take 20 mg by mouth daily as needed. Heartburn    . IBUPROFEN 200 MG PO TABS Oral Take 600 mg by mouth every 6 (six) hours as needed. Pain    . ADULT MULTIVITAMIN W/MINERALS CH Oral Take 1 tablet by mouth daily.    Holly Hodge ESTRADIOL 0.4-35 MG-MCG PO TABS Oral Take 1 tablet by mouth daily.    Holly Hodge Kitchen PROMETHAZINE HCL 25 MG PO TABS Oral Take 25 mg by mouth every 6 (six) hours as needed. Nausea and Vomiting      Triage Vitals: BP 115/70  Pulse 100  Temp 99.8 F (37.7 C)  Resp 18  Ht 5\' 4"  (1.626 m)  Wt 227 lb (102.967 kg)  BMI 38.96 kg/m2  SpO2 99%  LMP 02/23/2012  Physical Exam  Nursing note and vitals reviewed. Constitutional: She is oriented to person, place, and time. She appears well-developed and well-nourished. No distress.  HENT:  Head: Normocephalic and atraumatic.  Mouth/Throat: Oropharynx is clear and moist. No oropharyngeal exudate.  Eyes: EOM are normal.  Neck: Normal range of motion. Neck supple. No tracheal deviation present.  Cardiovascular: Normal rate, regular rhythm and normal heart sounds.   No murmur heard. Pulmonary/Chest: Effort normal and breath sounds normal. No respiratory distress. She has no wheezes. She has no rales. She exhibits tenderness (Left lower chest wall tenderness.).       Somewhat diminished breath sounds at left base. Shallow breaths and decreased air movement.  Abdominal: Soft.  She exhibits no distension. There is no tenderness. There is no rebound.  Musculoskeletal: Normal range of motion.  Neurological: She is alert and oriented to person, place, and time.  Skin: Skin is warm and dry.  Psychiatric: She has a normal mood and affect. Her behavior is normal.    ED Course  Procedures (including critical care time) DIAGNOSTIC STUDIES: Oxygen Saturation is 99% on room air, normal by my interpretation.     Date: 03/08/2012  Rate: 71  Rhythm: normal sinus rhythm  QRS Axis: normal  Intervals: normal  ST/T Wave abnormalities: normal  Conduction Disutrbances:none  Narrative Interpretation: Sinus arrhythymia  Old EKG Reviewed: unchanged  Lovenox started in emergency department after consulting with the admitting physician. Admission arranged to the [facility]  COORDINATION OF CARE: At 825 AM Discussed treatment plan with patient which includes CXR, tylenol, blood work, and UA. Patient agrees.   1045 AM Informed patient of her positive left lower lobe PE and discussed her precipitating risk factors such as BC and smoking. Patient to be admitted, she understands and agrees.   Labs Reviewed  BASIC METABOLIC PANEL - Abnormal; Notable for the following:    Glucose, Bld 103 (*)     All other components within normal limits  D-DIMER, QUANTITATIVE - Abnormal; Notable for the following:    D-Dimer, Quant 2.95 (*)     All other components within normal limits  CBC WITH DIFFERENTIAL  URINALYSIS, ROUTINE W REFLEX MICROSCOPIC  PROTIME-INR  APTT   Dg Chest 2 View  03/08/2012  *RADIOLOGY REPORT*  Clinical Data: Cough and chest pain.  CHEST - 2 VIEW  Comparison: Chest x-ray 12/13/2009.  Findings: Lung volumes are normal.  No consolidative airspace disease.  No pleural effusions.  No pneumothorax.  No pulmonary nodule or mass noted.  Pulmonary vasculature and the cardiomediastinal silhouette are within normal limits.  IMPRESSION: 1. No radiographic evidence of  acute cardiopulmonary disease.   Original Report Authenticated By: Florencia Reasons, M.D.    Ct Angio Chest Pe W/cm &/or Wo Cm  03/08/2012  *RADIOLOGY REPORT*  Clinical Data: Cough.  Pleuritic left sided chest pain for the past 3 days.  Elevated D-dimer.  CT ANGIOGRAPHY CHEST  Technique:  Multidetector CT imaging of the chest using the standard protocol during bolus administration of intravenous contrast. Multiplanar reconstructed images including MIPs were obtained and reviewed to evaluate the vascular anatomy.  Contrast: OMNIPAQUE IOHEXOL 350 MG/ML SOLN  Comparison: No priors.  Findings:  Mediastinum: In the distal aspect of the left lower lobe pulmonary artery there is a lobar sized filling defect which extends into several segmental and subsegmental sized branches to the left lower lobe, compatible with pulmonary embolism.  Some of these defects are completely  occlusive, while others are nonocclusive.  No other filling defects are noted in the pulmonary arterial tree to lower right lung. Heart size is normal. There is no significant pericardial fluid, thickening or pericardial calcification.  1 cm short-axis left hilar lymph node.  No other definite pathologically enlarged mediastinal or hilar lymph nodes are noted.  The esophagus is unremarkable in appearance.  Lungs/Pleura: In the posterobasal segment of the left lower lobe there is some subtle ground-glass attenuation air space disease, most consistent with pulmonary hemorrhage related to pulmonary infarction.  No other acute consolidative air space disease is noted.  No pleural effusions.  No definite suspicious appearing pulmonary nodules or masses are identified.  Upper Abdomen:  Unremarkable.  Musculoskeletal: There are no aggressive appearing lytic or blastic lesions noted in the visualized portions of the skeleton.  IMPRESSION: 1.  Study is positive for a large pulmonary embolism to the left lower lobe involving lobar, segmental and  subsegmental sized branches.  Some of these filling defects are completely occlusive, and there does appear to be a small amount of pulmonary hemorrhage in the left lower lobe related to this pulmonary infarction. 2.  Nonspecific 1 cm short axis left hilar lymph nodes is presumably reactive.  These results were called by telephone on 03/08/2012 at 10:10 a.m. to Dr. Effie Shy, who verbally acknowledged these results.   Original Report Authenticated By: Florencia Reasons, M.D.      1. Pulmonary embolism       MDM   Large pulmonary embolism, without hemodynamic, compromise. Patient needs admission for stabilization.    I personally performed the services described in this documentation, which was scribed in my presence. The recorded information has been reviewed and considered.        Holly Melter, MD 03/08/12 [phone]

## 2012-03-08 NOTE — H&P (Signed)
Hospital Admission Note Date: 03/08/2012  Patient name: Holly Hodge Medical record number: [id number] Date of birth: 09-17-1985 Age: 26 y.o. Gender: female PCP: Colette Ribas, MD  Attending physician: Christiane Ha, MD  Chief Complaint: chest pain  History of Present Illness:  Holly Hodge is an 26 y.o. female with a several day history of pleuritic left chest pain. She also has been coughing a lot. In the emergency room, she had stable vital signs. CT angiogram of the chest showed pulmonary embolus. Patient is on oral contraceptives for menorrhagia. She's been on them for about 3 months. She smokes a half a pack of cigarettes a day. Her and had a blood clot of some type. She has no previous history of thromboembolic disease. She's had no recent trips. She's had no recent trauma surgery or immobilization. She noticed that her right foot seemed to be cramping and swollen 1-2 weeks ago.  Past Medical History  Diagnosis Date  . Anxiety     Meds: Prescriptions prior to admission  Medication Sig Dispense Refill  . famotidine (PEPCID) 20 MG tablet Take 20 mg by mouth daily as needed. Heartburn      . ibuprofen (ADVIL,MOTRIN) 200 MG tablet Take 600 mg by mouth every 6 (six) hours as needed. Pain      . Multiple Vitamin (MULTIVITAMIN WITH MINERALS) TABS Take 1 tablet by mouth daily.      . norethindrone-ethinyl estradiol (ZENCHENT) 0.4-35 MG-MCG tablet Take 1 tablet by mouth daily.      . promethazine (PHENERGAN) 25 MG tablet Take 25 mg by mouth every 6 (six) hours as needed. Nausea and Vomiting        Allergies: Review of patient's allergies indicates no known allergies. History   Social History  . Marital Status: Single    Spouse Name: N/A    Number of Children: N/A  . Years of Education: N/A   Occupational History  . Not on file.   Social History Main Topics  . Smoking status: Current Everyday Smoker -- 0.5 packs/day for 6 years    Types: Cigarettes  . Smokeless  tobacco: Never Used  . Alcohol Use: No     occasionally-holidays  . Drug Use: No  . Sexually Active: Not Currently    Birth Control/ Protection: Pill   Other Topics Concern  . Not on file   Social History Narrative  . No narrative on file   Family History  Problem Relation Age of Onset  . Hypertension Mother   . Hypertension Father   . Stroke Father   . Cancer Other    Past Surgical History  Procedure Date  . Cesarean section     x2  . Cholecystectomy 2005    Review of Systems: Systems reviewed and as per HPI, otherwise negative.  Physical Exam: Blood pressure 102/66, pulse 72, temperature 98.5 F (36.9 C), temperature source Oral, resp. rate 18, height 5\' 6"  (1.676 m), weight 103.4 kg (227 lb 15.3 oz), last menstrual period 02/23/2012, SpO2 99.00%. BP 102/66  Pulse 72  Temp 98.5 F (36.9 C) (Oral)  Resp 18  Ht 5\' 6"  (1.676 m)  Wt 103.4 kg (227 lb 15.3 oz)  BMI 36.79 kg/m2  SpO2 99%  LMP 02/23/2012  General Appearance:    Alert, cooperative, no distress, appears stated age.obese   Head:    Normocephalic, without obvious abnormality, atraumatic  Eyes:    PERRL, conjunctiva/corneas clear, EOM's intact, fundi    benign, both eyes  Ears:  Normal TM's and external ear canals, both ears  Nose:   Nares normal, septum midline, mucosa normal, no drainage    or sinus tenderness  Throat:   Lips, mucosa, and tongue normal; teeth and gums normal  Neck:   Supple, symmetrical, trachea midline, no adenopathy;    thyroid:  no enlargement/tenderness/nodules; no carotid   bruit or JVD  Back:     Symmetric, no curvature, ROM normal, no CVA tenderness  Lungs:     Clear to auscultation bilaterally, respirations unlabored  Chest Wall:    No tenderness or deformity   Heart:    Regular rate and rhythm, S1 and S2 normal, no murmur, rub   or gallop     Abdomen:     Soft, non-tender, bowel sounds active all four quadrants,    Obese   Genitalia:   deferred   Rectal:   deferred     Extremities:   Extremities normal, atraumatic, no cyanosis or edema. No calf tenderness. Homans sign negative.   Pulses:   2+ and symmetric all extremities  Skin:   Skin color, texture, turgor normal, no rashes or lesions  Lymph nodes:   Cervical, supraclavicular, and axillary nodes normal  Neurologic:   CNII-XII intact, normal strength, sensation and reflexes    throughout    Psychiatric: Normal affect.  Lab results: Basic Metabolic Panel:  Basename 03/08/12 0829  NA 138  K 3.7  CL 104  CO2 24  GLUCOSE 103*  BUN 8  CREATININE 0.86  CALCIUM 9.1  MG --  PHOS --   Liver Function Tests: No results found for this basename: AST:2,ALT:2,ALKPHOS:2,BILITOT:2,PROT:2,ALBUMIN:2 in the last 72 hours No results found for this basename: LIPASE:2,AMYLASE:2 in the last 72 hours No results found for this basename: AMMONIA:2 in the last 72 hours CBC:  Basename 03/08/12 0829  WBC 7.0  NEUTROABS 4.4  HGB 13.0  HCT 37.7  MCV 87.3  PLT 239   Cardiac Enzymes: No results found for this basename: CKTOTAL:3,CKMB:3,CKMBINDEX:3,TROPONINI:3 in the last 72 hours BNP: No results found for this basename: PROBNP:3 in the last 72 hours D-Dimer:  Holly Hodge 03/08/12 0829  DDIMER 2.95*   CBG: No results found for this basename: GLUCAP:6 in the last 72 hours Hemoglobin A1C: No results found for this basename: HGBA1C in the last 72 hours Fasting Lipid Panel: No results found for this basename: CHOL,HDL,LDLCALC,TRIG,CHOLHDL,LDLDIRECT in the last 72 hours Thyroid Function Tests: No results found for this basename: TSH,T4TOTAL,FREET4,T3FREE,THYROIDAB in the last 72 hours Anemia Panel: No results found for this basename: VITAMINB12,FOLATE,FERRITIN,TIBC,IRON,RETICCTPCT in the last 72 hours Coagulation:  Basename 03/08/12 1017  LABPROT 13.7  INR 1.03   Urine Drug Screen: Drugs of Abuse     Component Value Date/Time   LABOPIA NONE DETECTED 01/10/2010 1300   COCAINSCRNUR NONE DETECTED 01/10/2010  1300   LABBENZ NONE DETECTED 01/10/2010 1300   AMPHETMU NONE DETECTED 01/10/2010 1300   THCU POSITIVE* 01/10/2010 1300   LABBARB  Value: NONE DETECTED        DRUG SCREEN FOR MEDICAL PURPOSES ONLY.  IF CONFIRMATION IS NEEDED FOR ANY PURPOSE, NOTIFY LAB WITHIN 5 DAYS.        LOWEST DETECTABLE LIMITS FOR URINE DRUG SCREEN Drug Class       Cutoff (ng/mL) Amphetamine      1000 Barbiturate      200 Benzodiazepine   200 Tricyclics       300 Opiates          300 Cocaine  300 THC              50 01/10/2010 1300    Alcohol Level: No results found for this basename: ETH:2 in the last 72 hours Urinalysis:  Basename 03/08/12 0840  COLORURINE YELLOW  LABSPEC 1.030  PHURINE 6.0  GLUCOSEU NEGATIVE  HGBUR NEGATIVE  BILIRUBINUR NEGATIVE  KETONESUR NEGATIVE  PROTEINUR NEGATIVE  UROBILINOGEN 0.2  NITRITE NEGATIVE  LEUKOCYTESUR NEGATIVE    Imaging results:  Dg Chest 2 View  03/08/2012  *RADIOLOGY REPORT*  Clinical Data: Cough and chest pain.  CHEST - 2 VIEW  Comparison: Chest x-ray 12/13/2009.  Findings: Lung volumes are normal.  No consolidative airspace disease.  No pleural effusions.  No pneumothorax.  No pulmonary nodule or mass noted.  Pulmonary vasculature and the cardiomediastinal silhouette are within normal limits.  IMPRESSION: 1. No radiographic evidence of acute cardiopulmonary disease.   Original Report Authenticated By: Holly Hodge, M.D.    Ct Angio Chest Pe W/cm &/or Wo Cm  03/08/2012  *RADIOLOGY REPORT*  Clinical Data: Cough.  Pleuritic left sided chest pain for the past 3 days.  Elevated D-dimer.  CT ANGIOGRAPHY CHEST  Technique:  Multidetector CT imaging of the chest using the standard protocol during bolus administration of intravenous contrast. Multiplanar reconstructed images including MIPs were obtained and reviewed to evaluate the vascular anatomy.  Contrast: OMNIPAQUE IOHEXOL 350 MG/ML SOLN  Comparison: No priors.  Findings:  Mediastinum: In the distal aspect of the left  lower lobe pulmonary artery there is a lobar sized filling defect which extends into several segmental and subsegmental sized branches to the left lower lobe, compatible with pulmonary embolism.  Some of these defects are completely occlusive, while others are nonocclusive.  No other filling defects are noted in the pulmonary arterial tree to lower right lung. Heart size is normal. There is no significant pericardial fluid, thickening or pericardial calcification.  1 cm short-axis left hilar lymph node.  No other definite pathologically enlarged mediastinal or hilar lymph nodes are noted.  The esophagus is unremarkable in appearance.  Lungs/Pleura: In the posterobasal segment of the left lower lobe there is some subtle ground-glass attenuation air space disease, most consistent with pulmonary hemorrhage related to pulmonary infarction.  No other acute consolidative air space disease is noted.  No pleural effusions.  No definite suspicious appearing pulmonary nodules or masses are identified.  Upper Abdomen:  Unremarkable.  Musculoskeletal: There are no aggressive appearing lytic or blastic lesions noted in the visualized portions of the skeleton.  IMPRESSION: 1.  Study is positive for a large pulmonary embolism to the left lower lobe involving lobar, segmental and subsegmental sized branches.  Some of these filling defects are completely occlusive, and there does appear to be a small amount of pulmonary hemorrhage in the left lower lobe related to this pulmonary infarction. 2.  Nonspecific 1 cm short axis left hilar lymph nodes is presumably reactive.  These results were called by telephone on 03/08/2012 at 10:10 a.m. to Dr. Effie Hodge, who verbally acknowledged these results.   Original Report Authenticated By: Holly Hodge, M.D.     EKG shows normal sinus rhythm.  Assessment & Plan: Principal Problem:  *Pulmonary embolus with infarction Active Problems:  Tobacco abuse  Obesity  Menorrhagia  Smoking  and oral contraceptives may have led to her blood clot. However I will check a hypercoagulable panel. She will get Lovenox and Coumadin. Coumadin teaching. If stable, consider home Lovenox and Coumadin bridge therapy. Stop her  oral contraceptives. She has an appointment coming up with OB/GYN in the near future. She was counseled to quit smoking.  Fifi Schindler L 03/08/2012, 4:15 PM

## 2012-03-08 NOTE — ED Notes (Signed)
Pt returned from ct scan, alert and verbalizes no complaints.

## 2012-03-09 DIAGNOSIS — R112 Nausea with vomiting, unspecified: Secondary | ICD-10-CM

## 2012-03-09 LAB — LIPASE, BLOOD: Lipase: 17 U/L (ref 11–59)

## 2012-03-09 LAB — PREGNANCY, URINE: Preg Test, Ur: NEGATIVE

## 2012-03-09 LAB — COMPREHENSIVE METABOLIC PANEL
AST: 46 U/L — ABNORMAL HIGH (ref 0–37)
Albumin: 3.3 g/dL — ABNORMAL LOW (ref 3.5–5.2)
Alkaline Phosphatase: 87 U/L (ref 39–117)
BUN: 8 mg/dL (ref 6–23)
CO2: 25 mEq/L (ref 19–32)
Chloride: 103 mEq/L (ref 96–112)
Creatinine, Ser: 0.75 mg/dL (ref 0.50–1.10)
GFR calc non Af Amer: >90 mL/min (ref >90)
Potassium: 3.9 mEq/L (ref 3.5–5.1)
Total Bilirubin: 0.4 mg/dL (ref 0.3–1.2)

## 2012-03-09 LAB — LUPUS ANTICOAGULANT PANEL
DRVVT: 40.9 secs (ref <45.1)
PTT Lupus Anticoagulant: 48.9 secs — ABNORMAL HIGH (ref 28.0–43.0)
PTTLA 4:1 Mix: 48.3 secs — ABNORMAL HIGH (ref 28.0–43.0)
PTTLA Confirmation: 14.9 secs — ABNORMAL HIGH (ref <8.0)

## 2012-03-09 LAB — PROTIME-INR: Prothrombin Time: 14.8 seconds (ref 11.6–15.2)

## 2012-03-09 LAB — FACTOR 5 LEIDEN

## 2012-03-09 MED ORDER — PANTOPRAZOLE SODIUM 40 MG IV SOLR
40.0000 mg | INTRAVENOUS | Status: DC
  Administered 2012-03-09 – 2012-03-10 (×2): 40 mg via INTRAVENOUS
  Filled 2012-03-09 (×2): qty 40

## 2012-03-09 MED ORDER — ONDANSETRON HCL 4 MG/2ML IJ SOLN
4.0000 mg | Freq: Four times a day (QID) | INTRAMUSCULAR | Status: DC
  Administered 2012-03-09 – 2012-03-11 (×6): 4 mg via INTRAVENOUS
  Filled 2012-03-09 (×6): qty 2

## 2012-03-09 MED ORDER — LORAZEPAM 2 MG/ML IJ SOLN
0.5000 mg | Freq: Once | INTRAMUSCULAR | Status: AC
  Administered 2012-03-09: 0.5 mg via INTRAVENOUS
  Filled 2012-03-09: qty 1

## 2012-03-09 MED ORDER — MOXIFLOXACIN HCL IN NACL 400 MG/250ML IV SOLN
INTRAVENOUS | Status: AC
  Filled 2012-03-09: qty 250

## 2012-03-09 MED ORDER — LORAZEPAM 2 MG/ML IJ SOLN
0.5000 mg | Freq: Once | INTRAMUSCULAR | Status: AC
Start: 2012-03-09 — End: 2012-03-09
  Administered 2012-03-09: 0.5 mg via INTRAVENOUS
  Filled 2012-03-09: qty 1

## 2012-03-09 MED ORDER — MAGNESIUM HYDROXIDE 400 MG/5ML PO SUSP
15.0000 mL | Freq: Once | ORAL | Status: AC
  Administered 2012-03-09: 15 mL via ORAL
  Filled 2012-03-09: qty 30

## 2012-03-09 MED ORDER — MORPHINE SULFATE 2 MG/ML IJ SOLN
2.0000 mg | INTRAMUSCULAR | Status: DC | PRN
  Administered 2012-03-09 (×3): 2 mg via INTRAVENOUS
  Filled 2012-03-09 (×3): qty 1

## 2012-03-09 MED ORDER — MOXIFLOXACIN HCL IN NACL 400 MG/250ML IV SOLN
400.0000 mg | INTRAVENOUS | Status: DC
  Administered 2012-03-10 – 2012-03-12 (×3): 400 mg via INTRAVENOUS
  Filled 2012-03-09 (×5): qty 250

## 2012-03-09 MED ORDER — TRAMADOL HCL 50 MG PO TABS: 25.0000 mg | ORAL_TABLET | Freq: Four times a day (QID) | ORAL | Status: DC | PRN

## 2012-03-09 MED ORDER — TRAZODONE HCL 50 MG PO TABS: 50.0000 mg | ORAL_TABLET | Freq: Once | ORAL | Status: DC

## 2012-03-09 MED ORDER — FENTANYL CITRATE 0.05 MG/ML IJ SOLN
100.0000 ug | Freq: Once | INTRAMUSCULAR | Status: AC
  Administered 2012-03-09: 100 ug via INTRAVENOUS
  Filled 2012-03-09: qty 2

## 2012-03-09 MED ORDER — HYDROMORPHONE HCL PF 1 MG/ML IJ SOLN
1.0000 mg | Freq: Once | INTRAMUSCULAR | Status: AC
  Administered 2012-03-09: 1 mg via INTRAVENOUS
  Filled 2012-03-09: qty 1

## 2012-03-09 MED ORDER — ONDANSETRON HCL 4 MG/2ML IJ SOLN
4.0000 mg | Freq: Four times a day (QID) | INTRAMUSCULAR | Status: DC | PRN
  Administered 2012-03-09: 4 mg via INTRAVENOUS
  Filled 2012-03-09: qty 2

## 2012-03-09 MED ORDER — PROMETHAZINE HCL 25 MG/ML IJ SOLN
12.5000 mg | INTRAMUSCULAR | Status: DC | PRN
  Administered 2012-03-09 – 2012-03-11 (×2): 12.5 mg via INTRAVENOUS
  Filled 2012-03-09 (×2): qty 1

## 2012-03-09 MED ORDER — SODIUM CHLORIDE 0.9 % IJ SOLN
INTRAMUSCULAR | Status: AC
  Filled 2012-03-09: qty 3

## 2012-03-09 MED ORDER — KCL IN DEXTROSE-NACL 20-5-0.9 MEQ/L-%-% IV SOLN
INTRAVENOUS | Status: DC
  Administered 2012-03-09: 1000 mL via INTRAVENOUS
  Administered 2012-03-10: 06:00:00 via INTRAVENOUS

## 2012-03-09 MED ORDER — POLYETHYLENE GLYCOL 3350 17 G PO PACK
17.0000 g | PACK | Freq: Every day | ORAL | Status: DC
  Administered 2012-03-10 – 2012-03-12 (×3): 17 g via ORAL
  Filled 2012-03-09 (×3): qty 1

## 2012-03-09 MED ORDER — HYDROCODONE-ACETAMINOPHEN 5-325 MG PO TABS: 1.0000 | ORAL_TABLET | Freq: Four times a day (QID) | ORAL | Status: DC | PRN

## 2012-03-09 MED ORDER — BISACODYL 10 MG RE SUPP
10.0000 mg | Freq: Every day | RECTAL | Status: DC | PRN
  Administered 2012-03-11: 10 mg via RECTAL
  Filled 2012-03-09: qty 1

## 2012-03-09 MED ORDER — FERUMOXYTOL INJECTION 510 MG/17 ML: 510.0000 mg | Freq: Once | INTRAVENOUS | Status: DC

## 2012-03-09 MED ORDER — WARFARIN SODIUM 10 MG PO TABS
10.0000 mg | ORAL_TABLET | Freq: Once | ORAL | Status: AC
  Administered 2012-03-09: 10 mg via ORAL
  Filled 2012-03-09: qty 1

## 2012-03-09 NOTE — Care Management Note (Unsigned)
    Page 1 of 1   03/11/2012     12:01:51 PM   CARE MANAGEMENT NOTE 03/11/2012  Patient:  Holly Hodge, Holly Hodge   Account Number:  [id number]  Date Initiated:  03/09/2012  Documentation initiated by:  Sharrie Rothman  Subjective/Objective Assessment:   Pt admitted from home with PE. Pt lives with her 2 sons and works full time. Pt understands that she will need to discharge on lovenox. Nursing is teaching pt on how to administer lovenox.     Action/Plan:   CM will check copays for lovenox and relay info to pt. No other CM/HH needs noted.   Anticipated DC Date:  03/11/2012   Anticipated DC Plan:  HOME/SELF CARE      DC Planning Services  CM consult      Choice offered to / List presented to:             Status of service:  Completed, signed off Medicare Important Message given?   (If response is "NO", the following Medicare IM given date fields will be blank) Date Medicare IM given:   Date Additional Medicare IM given:    Discharge Disposition:  HOME/SELF CARE  Per UR Regulation:    If discussed at Long Length of Stay Meetings, dates discussed:    Comments:  03/11/12 1100 Keyshon Stein Leanord Hawking RN BSN CM DC with folllow up with with [name] 03/15/12, 03/09/12 1448 Arlyss Queen, RN BSN CM

## 2012-03-09 NOTE — Progress Notes (Signed)
Subjective: Patient had an episode of nausea and vomiting this morning. She has no complaints of abdominal pain, but stated that she hasn't had a bowel movement in 3-4 days. She also took 2 Vicodin tablets approximately 30-40 minutes prior to nausea and vomiting. She has less pleuritic chest pain. Her pain tends to worsen when she lays flat.  Objective: Vital signs in last 24 hours: Filed Vitals:   03/08/12 2127 03/08/12 2132 03/09/12 0534 03/09/12 0801  BP: 102/66 109/74 117/15 111/70  Pulse: 65 98 77 89  Temp: 98.1 F (36.7 C) 98.5 F (36.9 C) 98.1 F (36.7 C)   TempSrc: Oral Oral Oral   Resp: 18 18 20    Height:      Weight:      SpO2: 100% 100% 98% 100%    Intake/Output Summary (Last 24 hours) at 03/09/12 1003 Last data filed at 03/08/12 1803  Gross per 24 hour  Intake    480 ml  Output      0 ml  Net    480 ml    Weight change:   Physical exam: General: Pleasant 26 year old African American woman sitting up in bed, in no acute distress. Lungs: Mild splinting. Decreased breath sounds in the bases. Otherwise, clear. Heart: S1, S2, with no murmurs rubs or gallops. Next line abdomen: Mildly obese, positive bowel sounds, soft, nontender, nondistended. Extremities: No pedal edema. No calf tenderness bilaterally. No pedal edema.  Lab Results: Basic Metabolic Panel:  Basename 03/08/12 0829  NA 138  K 3.7  CL 104  CO2 24  GLUCOSE 103*  BUN 8  CREATININE 0.86  CALCIUM 9.1  MG --  PHOS --   Liver Function Tests: No results found for this basename: AST:2,ALT:2,ALKPHOS:2,BILITOT:2,PROT:2,ALBUMIN:2 in the last 72 hours No results found for this basename: LIPASE:2,AMYLASE:2 in the last 72 hours No results found for this basename: AMMONIA:2 in the last 72 hours CBC:  Basename 03/08/12 0829  WBC 7.0  NEUTROABS 4.4  HGB 13.0  HCT 37.7  MCV 87.3  PLT 239   Cardiac Enzymes: No results found for this basename: CKTOTAL:3,CKMB:3,CKMBINDEX:3,TROPONINI:3 in the last 72  hours BNP: No results found for this basename: PROBNP:3 in the last 72 hours D-Dimer:  Alvira Philips 03/08/12 0829  DDIMER 2.95*   CBG: No results found for this basename: GLUCAP:6 in the last 72 hours Hemoglobin A1C: No results found for this basename: HGBA1C in the last 72 hours Fasting Lipid Panel: No results found for this basename: CHOL,HDL,LDLCALC,TRIG,CHOLHDL,LDLDIRECT in the last 72 hours Thyroid Function Tests: No results found for this basename: TSH,T4TOTAL,FREET4,T3FREE,THYROIDAB in the last 72 hours Anemia Panel: No results found for this basename: VITAMINB12,FOLATE,FERRITIN,TIBC,IRON,RETICCTPCT in the last 72 hours Coagulation:  Basename 03/09/12 0506 03/08/12 1017  LABPROT 14.8 13.7  INR 1.14 1.03   Urine Drug Screen: Drugs of Abuse     Component Value Date/Time   LABOPIA NONE DETECTED 01/10/2010 1300   COCAINSCRNUR NONE DETECTED 01/10/2010 1300   LABBENZ NONE DETECTED 01/10/2010 1300   AMPHETMU NONE DETECTED 01/10/2010 1300   THCU POSITIVE* 01/10/2010 1300   LABBARB  Value: NONE DETECTED        DRUG SCREEN FOR MEDICAL PURPOSES ONLY.  IF CONFIRMATION IS NEEDED FOR ANY PURPOSE, NOTIFY LAB WITHIN 5 DAYS.        LOWEST DETECTABLE LIMITS FOR URINE DRUG SCREEN Drug Class       Cutoff (ng/mL) Amphetamine      1000 Barbiturate      200 Benzodiazepine   200  Tricyclics       300 Opiates          300 Cocaine          300 THC              50 01/10/2010 1300    Alcohol Level: No results found for this basename: ETH:2 in the last 72 hours Urinalysis:  Basename 03/08/12 0840  COLORURINE YELLOW  LABSPEC 1.030  PHURINE 6.0  GLUCOSEU NEGATIVE  HGBUR NEGATIVE  BILIRUBINUR NEGATIVE  KETONESUR NEGATIVE  PROTEINUR NEGATIVE  UROBILINOGEN 0.2  NITRITE NEGATIVE  LEUKOCYTESUR NEGATIVE   Misc. Labs:   Micro: No results found for this or any previous visit (from the past 240 hour(s)).  Studies/Results: Dg Chest 2 View  03/08/2012  *RADIOLOGY REPORT*  Clinical Data: Cough and chest pain.   CHEST - 2 VIEW  Comparison: Chest x-ray 12/13/2009.  Findings: Lung volumes are normal.  No consolidative airspace disease.  No pleural effusions.  No pneumothorax.  No pulmonary nodule or mass noted.  Pulmonary vasculature and the cardiomediastinal silhouette are within normal limits.  IMPRESSION: 1. No radiographic evidence of acute cardiopulmonary disease.   Original Report Authenticated By: Florencia Reasons, M.D.    Ct Angio Chest Pe W/cm &/or Wo Cm  03/08/2012  *RADIOLOGY REPORT*  Clinical Data: Cough.  Pleuritic left sided chest pain for the past 3 days.  Elevated D-dimer.  CT ANGIOGRAPHY CHEST  Technique:  Multidetector CT imaging of the chest using the standard protocol during bolus administration of intravenous contrast. Multiplanar reconstructed images including MIPs were obtained and reviewed to evaluate the vascular anatomy.  Contrast: OMNIPAQUE IOHEXOL 350 MG/ML SOLN  Comparison: No priors.  Findings:  Mediastinum: In the distal aspect of the left lower lobe pulmonary artery there is a lobar sized filling defect which extends into several segmental and subsegmental sized branches to the left lower lobe, compatible with pulmonary embolism.  Some of these defects are completely occlusive, while others are nonocclusive.  No other filling defects are noted in the pulmonary arterial tree to lower right lung. Heart size is normal. There is no significant pericardial fluid, thickening or pericardial calcification.  1 cm short-axis left hilar lymph node.  No other definite pathologically enlarged mediastinal or hilar lymph nodes are noted.  The esophagus is unremarkable in appearance.  Lungs/Pleura: In the posterobasal segment of the left lower lobe there is some subtle ground-glass attenuation air space disease, most consistent with pulmonary hemorrhage related to pulmonary infarction.  No other acute consolidative air space disease is noted.  No pleural effusions.  No definite suspicious appearing  pulmonary nodules or masses are identified.  Upper Abdomen:  Unremarkable.  Musculoskeletal: There are no aggressive appearing lytic or blastic lesions noted in the visualized portions of the skeleton.  IMPRESSION: 1.  Study is positive for a large pulmonary embolism to the left lower lobe involving lobar, segmental and subsegmental sized branches.  Some of these filling defects are completely occlusive, and there does appear to be a small amount of pulmonary hemorrhage in the left lower lobe related to this pulmonary infarction. 2.  Nonspecific 1 cm short axis left hilar lymph nodes is presumably reactive.  These results were called by telephone on 03/08/2012 at 10:10 a.m. to Dr. Effie Shy, who verbally acknowledged these results.   Original Report Authenticated By: Florencia Reasons, M.D.     Medications:  Scheduled:   . coumadin book   Does not apply Once  . enoxaparin (  LOVENOX) injection  1 mg/kg Subcutaneous Once  . enoxaparin (LOVENOX) injection  1 mg/kg Subcutaneous Q12H  . multivitamin with minerals  1 tablet Oral Daily  . pneumococcal 23 valent vaccine  0.5 mL Intramuscular Tomorrow-1000  . sodium chloride  3 mL Intravenous Q12H  . warfarin  10 mg Oral ONCE-1800  . warfarin  10 mg Oral ONCE-1800  . warfarin   Does not apply Once  . Warfarin - Pharmacist Dosing Inpatient   Does not apply q1800  . DISCONTD: sodium chloride   Intravenous STAT  . DISCONTD: enoxaparin (LOVENOX) injection  1 mg/kg Subcutaneous Q12H   Continuous:  ZOX:[id number], acetaminophen, guaiFENesin-dextromethorphan, HYDROcodone-acetaminophen, magnesium hydroxide, morphine injection, ondansetron, traZODone, DISCONTD:  HYDROmorphone (DILAUDID) injection, DISCONTD: ondansetron (ZOFRAN) IV  Assessment: Principal Problem:  *Pulmonary embolus with infarction Active Problems:  Tobacco abuse  Obesity  Menorrhagia  Nausea and vomiting    The patient is hemodynamically stable and afebrile. She still continues to be  symptomatic with pleuritic chest pain. She had nausea and vomiting this morning, likely secondary to Vicodin and possibly constipation. I agree, it is likely that her PE etiology is from accommodation of smoking and oral contraceptive agents. She was advised to stop smoking. She was instructed not to take oral contraceptive agents ever again. Hypercoagulable panel is pending.   Plan:  1. Continue Lovenox and Coumadin for an INR goal of 2-3. Consider home with Lovenox when she is less symptomatic. Would check the results of the hypercoagulable panel pending. 2. Will decrease the dose of Vicodin from 2 tablets to one tablet every 4 hours as needed. We'll continue as needed IV morphine for discomfort. 3. Antiemetic therapy as needed with Zofran. 3. We'll start laxative therapy. 4. Tobacco cessation counseling.   LOS: 1 day   Nijah Tejera 03/09/2012, 10:03 AM

## 2012-03-09 NOTE — Progress Notes (Signed)
ANTICOAGULATION CONSULT NOTE   Pharmacy Consult for Coumadin Indication: pulmonary embolus  No Known Allergies  Patient Measurements: Height: 5\' 6"  (167.6 cm) Weight: 227 lb 15.3 oz (103.4 kg) IBW/kg (Calculated) : 59.3   Vital Signs: Temp: 98.1 F (36.7 C) (09/04 0534) Temp src: Oral (09/04 0534) BP: 111/70 mmHg (09/04 0801) Pulse Rate: 89  (09/04 0801)  Labs:  Basename 03/09/12 0506 03/08/12 1017 03/08/12 0829  HGB -- -- 13.0  HCT -- -- 37.7  PLT -- -- 239  APTT -- 31 --  LABPROT 14.8 13.7 --  INR 1.14 1.03 --  HEPARINUNFRC -- -- --  CREATININE -- -- 0.86  CKTOTAL -- -- --  CKMB -- -- --  TROPONINI -- -- --   Estimated Creatinine Clearance: 120.3 ml/min (by C-G formula based on Cr of 0.86).  Medical History: Past Medical History  Diagnosis Date  . Anxiety    Medications:  Scheduled:     . coumadin book   Does not apply Once  . enoxaparin (LOVENOX) injection  1 mg/kg Subcutaneous Once  . enoxaparin (LOVENOX) injection  1 mg/kg Subcutaneous Q12H  . multivitamin with minerals  1 tablet Oral Daily  . pneumococcal 23 valent vaccine  0.5 mL Intramuscular Tomorrow-1000  . sodium chloride  3 mL Intravenous Q12H  . warfarin  10 mg Oral ONCE-1800  . warfarin  10 mg Oral ONCE-1800  . warfarin   Does not apply Once  . Warfarin - Pharmacist Dosing Inpatient   Does not apply q1800  . DISCONTD: sodium chloride   Intravenous STAT  . DISCONTD: enoxaparin (LOVENOX) injection  1 mg/kg Subcutaneous Q12H   Assessment: 26 yo F with +PE to start on Lovenox--> Coumadin bridge.  Coumadin score = 10 Normal renal function.  INR at baseline.   Goal of Therapy:  INR 2-3 Monitor platelets by anticoagulation protocol: Yes   Plan:  1) Coumadin 10mg  po x1 dose tonight 2) Daily INR 3) warfarin education completed  Valrie Hart A 03/09/2012,9:57 AM

## 2012-03-09 NOTE — Progress Notes (Signed)
UR Chart Review Completed  

## 2012-03-09 NOTE — Progress Notes (Signed)
Pt has had episodes of nausea and vomiting throughout the day. Each episode happened after patient took pain medication both oral and IV.The patients vital signs have remained stable. The MD was notified of each episode. After the patient could not tolerate tylenol the MD ordered the patient become NPO and have an ultrasound completed. Other orders were also written for the patient. The patient is scheduled to have the ultrasound completed on 03/10/12 because she was NPO long enough today to have it performed before the last ultrasound tech left @ 1730. Pt is receiving IV fluids and states the nausea and vomiting is starting to subside slowly. Orson Ape D 03/09/2012

## 2012-03-10 ENCOUNTER — Inpatient Hospital Stay (HOSPITAL_COMMUNITY): Payer: BC Managed Care – PPO

## 2012-03-10 LAB — BASIC METABOLIC PANEL
BUN: 5 mg/dL — ABNORMAL LOW (ref 6–23)
CO2: 26 mEq/L (ref 19–32)
GFR calc non Af Amer: >90 mL/min (ref >90)
Glucose, Bld: 110 mg/dL — ABNORMAL HIGH (ref 70–99)
Potassium: 4.3 mEq/L (ref 3.5–5.1)
Sodium: 138 mEq/L (ref 135–145)

## 2012-03-10 LAB — LIPASE, BLOOD: Lipase: 14 U/L (ref 11–59)

## 2012-03-10 LAB — CBC
HCT: 37.3 % (ref 36.0–46.0)
Hemoglobin: 12.5 g/dL (ref 12.0–15.0)
MCH: 29.6 pg (ref 26.0–34.0)
MCHC: 33.5 g/dL (ref 30.0–36.0)
MCV: 88.2 fL (ref 78.0–100.0)
RBC: 4.23 MIL/uL (ref 3.87–5.11)

## 2012-03-10 LAB — CARDIOLIPIN ANTIBODIES, IGG, IGM, IGA
Anticardiolipin IgA: 8 APL U/mL — ABNORMAL LOW (ref <22)
Anticardiolipin IgG: 21 GPL U/mL (ref <23)

## 2012-03-10 MED ORDER — SODIUM CHLORIDE 0.9 % IJ SOLN
INTRAMUSCULAR | Status: AC
  Administered 2012-03-10: 10 mL
  Filled 2012-03-10: qty 3

## 2012-03-10 MED ORDER — FENTANYL CITRATE 0.05 MG/ML IJ SOLN
50.0000 ug | INTRAMUSCULAR | Status: DC | PRN
  Administered 2012-03-10 – 2012-03-12 (×13): 100 ug via INTRAVENOUS
  Filled 2012-03-10 (×13): qty 2

## 2012-03-10 MED ORDER — SODIUM CHLORIDE 0.9 % IV SOLN
250.0000 mL | INTRAVENOUS | Status: DC | PRN
  Administered 2012-03-11: 09:00:00 via INTRAVENOUS

## 2012-03-10 MED ORDER — WARFARIN SODIUM 10 MG PO TABS
10.0000 mg | ORAL_TABLET | Freq: Once | ORAL | Status: AC
  Administered 2012-03-10: 10 mg via ORAL
  Filled 2012-03-10: qty 1

## 2012-03-10 MED ORDER — ENOXAPARIN SODIUM 150 MG/ML ~~LOC~~ SOLN
150.0000 mg | SUBCUTANEOUS | Status: DC
  Administered 2012-03-10 – 2012-03-12 (×3): 150 mg via SUBCUTANEOUS
  Filled 2012-03-10 (×5): qty 1

## 2012-03-10 MED ORDER — SODIUM CHLORIDE 0.9 % IJ SOLN
3.0000 mL | INTRAMUSCULAR | Status: DC | PRN
  Filled 2012-03-10: qty 3

## 2012-03-10 NOTE — Progress Notes (Signed)
Subjective: Patient was made n.p.o. late afternoon after several more episodes of nausea and vomiting which was associated with being given Vicodin and morphine. She hasn't had nausea vomiting since then. She continues to have 8/10 left-sided pleuritic chest pain. He was given fentanyl last night which relieved her pain and did not cause nausea or vomiting.  Objective: Vital signs in last 24 hours: Filed Vitals:   03/09/12 2300 03/10/12 0258 03/10/12 0537 03/10/12 0753  BP: 122/72 112/65 107/73   Pulse: 85 74 97   Temp: 98.5 F (36.9 C)  99 F (37.2 C)   TempSrc:   Oral   Resp:   20   Height:      Weight:      SpO2: 98% 100% 100% 99%    Intake/Output Summary (Last 24 hours) at 03/10/12 1039 Last data filed at 03/09/12 1800  Gross per 24 hour  Intake    240 ml  Output      0 ml  Net    240 ml    Weight change:   Physical exam: General: Pleasant 26 year old African American woman sitting up in bed, in no acute distress. Lungs: Mild splinting. Decreased breath sounds in the bases. Otherwise, clear. Heart: S1, S2, with no murmurs rubs or gallops.  Abdomen: Mildly tender in the epigastrium, obese, positive bowel sounds, soft, nondistended. Extremities: No pedal edema. No calf tenderness bilaterally. No pedal edema.  Lab Results: Basic Metabolic Panel:  Basename 03/10/12 0531 03/09/12 0506  NA 138 137  K 4.3 3.9  CL 102 103  CO2 26 25  GLUCOSE 110* 105*  BUN 5* 8  CREATININE 0.74 0.75  CALCIUM 9.2 9.4  MG -- --  PHOS -- --   Liver Function Tests:  [facility] 03/09/12 0506  AST 46*  ALT 26  ALKPHOS 87  BILITOT 0.4  PROT 7.2  ALBUMIN 3.3*    Basename 03/10/12 0531 03/09/12 0506  LIPASE 14 17  AMYLASE -- --   No results found for this basename: AMMONIA:2 in the last 72 hours CBC:  Basename 03/10/12 0531 03/08/12 0829  WBC 8.0 7.0  NEUTROABS -- 4.4  HGB 12.5 13.0  HCT 37.3 37.7  MCV 88.2 87.3  PLT 248 239   Cardiac Enzymes: No results found for  this basename: CKTOTAL:3,CKMB:3,CKMBINDEX:3,TROPONINI:3 in the last 72 hours BNP: No results found for this basename: PROBNP:3 in the last 72 hours D-Dimer:  Alvira Philips 03/08/12 0829  DDIMER 2.95*   CBG: No results found for this basename: GLUCAP:6 in the last 72 hours Hemoglobin A1C: No results found for this basename: HGBA1C in the last 72 hours Fasting Lipid Panel: No results found for this basename: CHOL,HDL,LDLCALC,TRIG,CHOLHDL,LDLDIRECT in the last 72 hours Thyroid Function Tests: No results found for this basename: TSH,T4TOTAL,FREET4,T3FREE,THYROIDAB in the last 72 hours Anemia Panel: No results found for this basename: VITAMINB12,FOLATE,FERRITIN,TIBC,IRON,RETICCTPCT in the last 72 hours Coagulation:  Basename 03/10/12 0531 03/09/12 0506  LABPROT 17.0* 14.8  INR 1.36 1.14   Urine Drug Screen: Drugs of Abuse     Component Value Date/Time   LABOPIA NONE DETECTED 01/10/2010 1300   COCAINSCRNUR NONE DETECTED 01/10/2010 1300   LABBENZ NONE DETECTED 01/10/2010 1300   AMPHETMU NONE DETECTED 01/10/2010 1300   THCU POSITIVE* 01/10/2010 1300   LABBARB  Value: NONE DETECTED        DRUG SCREEN FOR MEDICAL PURPOSES ONLY.  IF CONFIRMATION IS NEEDED FOR ANY PURPOSE, NOTIFY LAB WITHIN 5 DAYS.        LOWEST DETECTABLE  LIMITS FOR URINE DRUG SCREEN Drug Class       Cutoff (ng/mL) Amphetamine      1000 Barbiturate      200 Benzodiazepine   200 Tricyclics       300 Opiates          300 Cocaine          300 THC              50 01/10/2010 1300    Alcohol Level: No results found for this basename: ETH:2 in the last 72 hours Urinalysis:  Basename 03/08/12 0840  COLORURINE YELLOW  LABSPEC 1.030  PHURINE 6.0  GLUCOSEU NEGATIVE  HGBUR NEGATIVE  BILIRUBINUR NEGATIVE  KETONESUR NEGATIVE  PROTEINUR NEGATIVE  UROBILINOGEN 0.2  NITRITE NEGATIVE  LEUKOCYTESUR NEGATIVE   Misc. Labs:   Micro: No results found for this or any previous visit (from the past 240 hour(s)).  Studies/Results: No results  found.  Medications:  Scheduled:    . enoxaparin (LOVENOX) injection  150 mg Subcutaneous Q24H  . fentaNYL  100 mcg Intravenous Once  .  HYDROmorphone (DILAUDID) injection  1 mg Intravenous Once  . LORazepam  0.5 mg Intravenous Once  . LORazepam  0.5 mg Intravenous Once  . magnesium hydroxide  15 mL Oral Once  . moxifloxacin  400 mg Intravenous Q24H  . multivitamin with minerals  1 tablet Oral Daily  . ondansetron (ZOFRAN) IV  4 mg Intravenous Q6H  . pantoprazole (PROTONIX) IV  40 mg Intravenous Q24H  . pneumococcal 23 valent vaccine  0.5 mL Intramuscular Tomorrow-1000  . polyethylene glycol  17 g Oral Daily  . sodium chloride  3 mL Intravenous Q12H  . sodium chloride      . warfarin  10 mg Oral ONCE-1800  . warfarin  10 mg Oral ONCE-1800  . Warfarin - Pharmacist Dosing Inpatient   Does not apply q1800  . DISCONTD: enoxaparin (LOVENOX) injection  1 mg/kg Subcutaneous Q12H  . DISCONTD: ferumoxytol  510 mg Intravenous Once  . DISCONTD: traZODone  50 mg Oral Once   Continuous:    . dextrose 5 % and 0.9 % NaCl with KCl 20 mEq/L 100 mL/hr at 03/10/12 0601   WUJ:[id number], acetaminophen, bisacodyl, fentaNYL, guaiFENesin-dextromethorphan, magnesium hydroxide, promethazine, DISCONTD: HYDROcodone-acetaminophen, DISCONTD:  morphine injection, DISCONTD: ondansetron, DISCONTD: traMADol, DISCONTD: traZODone  Assessment: Principal Problem:  *Pulmonary embolus with infarction Active Problems:  Tobacco abuse  Obesity  Menorrhagia  Nausea and vomiting     1. Pulmonary embolism with infarction. She continues to have pleuritic chest pain associated with it. She is splinting. She is now tolerating fentanyl. Avelox was started empirically over night. Her hypercoagulable panel is positive for lupus anticoagulant. This along with oral contraceptive agents and tobacco precipitated the PE. It is possible she may require lifelong anticoagulation.  Nausea and vomiting. This is still thought  to be secondary to both Vicodin and morphine. Both have been discontinued. Constipation could also be a contributor. Her lipase is within normal limits. Her SGOT is only slightly elevated. She has a history of cholecystectomy. Ultrasound of her abdomen is pending. We'll continue IV Protonix and scheduled IV Zofran. Continue MiraLax.  Tobacco abuse and marijuana use. The patient was advised to stop smoking both.    Plan:  1.Continue management as above. 2. We'll check the results of the ultrasound pending. 3. We'll consider restarting diet, starting at full liquids initially to see if she can tolerate it. 4. Her incentive spirometry. 5. Consider home Lovenox  and Coumadin when the patient's pain subsides.    LOS: 2 days   Holly Hodge 03/10/2012, 10:39 AM

## 2012-03-10 NOTE — Progress Notes (Signed)
Pt tried to do the IS but was not able to inhale due to the left flank pain. She stated that if she bend over at her waiste she would be able to do it. RT did not try again. RT will instruct again at 1300

## 2012-03-10 NOTE — Progress Notes (Addendum)
ANTICOAGULATION CONSULT NOTE   Pharmacy Consult for Coumadin Indication: pulmonary embolus  No Known Allergies  Patient Measurements: Height: 5\' 6"  (167.6 cm) Weight: 227 lb 15.3 oz (103.4 kg) IBW/kg (Calculated) : 59.3   Vital Signs: Temp: 99 F (37.2 C) (09/05 0537) Temp src: Oral (09/05 0537) BP: 107/73 mmHg (09/05 0537) Pulse Rate: 97  (09/05 0537)  Labs:  Basename 03/10/12 0531 03/09/12 0506 03/08/12 1017 03/08/12 0829  HGB 12.5 -- -- 13.0  HCT 37.3 -- -- 37.7  PLT 248 -- -- 239  APTT -- -- 31 --  LABPROT 17.0* 14.8 13.7 --  INR 1.36 1.14 1.03 --  HEPARINUNFRC -- -- -- --  CREATININE 0.74 0.75 -- 0.86  CKTOTAL -- -- -- --  CKMB -- -- -- --  TROPONINI -- -- -- --   Estimated Creatinine Clearance: 129.4 ml/min (by C-G formula based on Cr of 0.74).  Medical History: Past Medical History  Diagnosis Date  . Anxiety    Medications:  Scheduled:     . enoxaparin (LOVENOX) injection  1 mg/kg Subcutaneous Q12H  . fentaNYL  100 mcg Intravenous Once  .  HYDROmorphone (DILAUDID) injection  1 mg Intravenous Once  . LORazepam  0.5 mg Intravenous Once  . LORazepam  0.5 mg Intravenous Once  . magnesium hydroxide  15 mL Oral Once  . moxifloxacin  400 mg Intravenous Q24H  . multivitamin with minerals  1 tablet Oral Daily  . ondansetron (ZOFRAN) IV  4 mg Intravenous Q6H  . pantoprazole (PROTONIX) IV  40 mg Intravenous Q24H  . pneumococcal 23 valent vaccine  0.5 mL Intramuscular Tomorrow-1000  . polyethylene glycol  17 g Oral Daily  . sodium chloride  3 mL Intravenous Q12H  . sodium chloride      . warfarin  10 mg Oral ONCE-1800  . warfarin  10 mg Oral ONCE-1800  . Warfarin - Pharmacist Dosing Inpatient   Does not apply q1800  . DISCONTD: ferumoxytol  510 mg Intravenous Once  . DISCONTD: traZODone  50 mg Oral Once   Assessment: 26 yo F with +PE to start on Lovenox--> Coumadin bridge.  Coumadin score = 10 Normal renal function.  Pt did have some n/v associated with  pain medication.  INR moving slowly.  Goal of Therapy:  INR 2-3 Monitor platelets by anticoagulation protocol: Yes   Plan:  1) Coumadin 10mg  po x1 dose tonight 2) Daily INR 3) warfarin education completed 4) Continue Lovenox:  Change to 1.5mg /Kg sq daily  Valrie Hart A 03/10/2012,8:07 AM

## 2012-03-11 ENCOUNTER — Encounter (HOSPITAL_COMMUNITY): Payer: Self-pay | Admitting: Internal Medicine

## 2012-03-11 DIAGNOSIS — R760 Raised antibody titer: Secondary | ICD-10-CM

## 2012-03-11 DIAGNOSIS — R894 Abnormal immunological findings in specimens from other organs, systems and tissues: Secondary | ICD-10-CM

## 2012-03-11 HISTORY — DX: Raised antibody titer: R76.0

## 2012-03-11 MED ORDER — PANTOPRAZOLE SODIUM 40 MG PO TBEC
40.0000 mg | DELAYED_RELEASE_TABLET | Freq: Every day | ORAL | Status: DC
  Administered 2012-03-11 – 2012-03-12 (×2): 40 mg via ORAL
  Filled 2012-03-11 (×2): qty 1

## 2012-03-11 MED ORDER — SODIUM CHLORIDE 0.9 % IJ SOLN
INTRAMUSCULAR | Status: AC
  Filled 2012-03-11: qty 3

## 2012-03-11 MED ORDER — WARFARIN SODIUM 7.5 MG PO TABS
15.0000 mg | ORAL_TABLET | Freq: Once | ORAL | Status: AC
  Administered 2012-03-11: 15 mg via ORAL
  Filled 2012-03-11: qty 2

## 2012-03-11 MED ORDER — ONDANSETRON HCL 4 MG/2ML IJ SOLN
4.0000 mg | Freq: Four times a day (QID) | INTRAMUSCULAR | Status: DC | PRN
  Administered 2012-03-11: 4 mg via INTRAVENOUS
  Filled 2012-03-11: qty 2

## 2012-03-11 NOTE — Progress Notes (Signed)
ANTICOAGULATION CONSULT NOTE   Pharmacy Consult for Coumadin Indication: pulmonary embolus  No Known Allergies  Patient Measurements: Height: 5\' 6"  (167.6 cm) Weight: 227 lb 15.3 oz (103.4 kg) IBW/kg (Calculated) : 59.3   Vital Signs: Temp: 98.4 F (36.9 C) (09/06 0545) Temp src: Oral (09/06 0545) BP: 106/70 mmHg (09/06 0545) Pulse Rate: 82  (09/06 0545)  Labs:  Basename 03/11/12 0556 03/10/12 0531 03/09/12 0506 03/08/12 1017 03/08/12 0829  HGB -- 12.5 -- -- 13.0  HCT -- 37.3 -- -- 37.7  PLT -- 248 -- -- 239  APTT -- -- -- 31 --  LABPROT 18.2* 17.0* 14.8 -- --  INR 1.48 1.36 1.14 -- --  HEPARINUNFRC -- -- -- -- --  CREATININE -- 0.74 0.75 -- 0.86  CKTOTAL -- -- -- -- --  CKMB -- -- -- -- --  TROPONINI -- -- -- -- --   Estimated Creatinine Clearance: 129.4 ml/min (by C-G formula based on Cr of 0.74).  Medical History: Past Medical History  Diagnosis Date  . Anxiety   . 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/11/2012   Medications:  Scheduled:     . enoxaparin (LOVENOX) injection  150 mg Subcutaneous Q24H  . moxifloxacin  400 mg Intravenous Q24H  . multivitamin with minerals  1 tablet Oral Daily  . ondansetron (ZOFRAN) IV  4 mg Intravenous Q6H  . pantoprazole (PROTONIX) IV  40 mg Intravenous Q24H  . polyethylene glycol  17 g Oral Daily  . sodium chloride  3 mL Intravenous Q12H  . sodium chloride      . sodium chloride      . sodium chloride      . sodium chloride      . sodium chloride      . sodium chloride      . warfarin  10 mg Oral ONCE-1800  . warfarin  15 mg Oral Once  . Warfarin - Pharmacist Dosing Inpatient   Does not apply q1800  . DISCONTD: enoxaparin (LOVENOX) injection  1 mg/kg Subcutaneous Q12H   Assessment: 26 yo F with +PE to start on Lovenox--> Coumadin bridge.  Coumadin score = 10.  Sluggish INR response despite 10mg  doses.  Normal renal function.  Pt is having some n/v associated with pain medication but INR response is sluggish despite the n/v.   Pt is clearly Coumadin resistant.  Goal of Therapy:  INR 2-3 Monitor platelets by anticoagulation protocol: Yes   Plan:  1) Coumadin 15mg  po x1 dose today at 12 noon 2) Daily INR until therapeutic and stable 3) warfarin education completed 4) Continue Lovenox:  Change to 1.5mg /Kg sq daily until INR > 2 x 24 hrs  Holly Hodge A 03/11/2012,8:16 AM

## 2012-03-11 NOTE — Progress Notes (Signed)
Patient successfully demonstrated giving Lovenox SQ.

## 2012-03-11 NOTE — Progress Notes (Signed)
Subjective: The patient's nausea and vomiting have resolved. She is eating without symptoms. She rates her pleuritic left-sided chest pain as mild to moderate in status severe. She ambulated some last night, but became short of breath.  Objective: Vital signs in last 24 hours: Filed Vitals:   03/10/12 1446 03/10/12 1942 03/10/12 2056 03/11/12 0545  BP: 105/72  121/78 106/70  Pulse: 81  90 82  Temp: 98.1 F (36.7 C)  98.7 F (37.1 C) 98.4 F (36.9 C)  TempSrc: Oral  Oral Oral  Resp: 20  20 20   Height:      Weight:      SpO2: 98% 98% 98% 94%    Intake/Output Summary (Last 24 hours) at 03/11/12 0917 Last data filed at 03/10/12 1800  Gross per 24 hour  Intake 2623.67 ml  Output      0 ml  Net 2623.67 ml    Weight change:   Physical exam: General: Pleasant 26 year old African American woman sitting up in bed, in no acute distress. Lungs: Mild splinting. Decreased breath sounds in the bases. Otherwise, clear. Heart: S1, S2, with no murmurs rubs or gallops.  Abdomen: Positive bowel sounds, nontender,, obese, positive bowel sounds, soft, nondistended. Extremities: No pedal edema. No calf tenderness bilaterally. No pedal edema.  Lab Results: Basic Metabolic Panel:  Basename 03/10/12 0531 03/09/12 0506  NA 138 137  K 4.3 3.9  CL 102 103  CO2 26 25  GLUCOSE 110* 105*  BUN 5* 8  CREATININE 0.74 0.75  CALCIUM 9.2 9.4  MG -- --  PHOS -- --   Liver Function Tests:  [facility] 03/09/12 0506  AST 46*  ALT 26  ALKPHOS 87  BILITOT 0.4  PROT 7.2  ALBUMIN 3.3*    Basename 03/10/12 0531 03/09/12 0506  LIPASE 14 17  AMYLASE -- --   No results found for this basename: AMMONIA:2 in the last 72 hours CBC:  Basename 03/10/12 0531  WBC 8.0  NEUTROABS --  HGB 12.5  HCT 37.3  MCV 88.2  PLT 248   Cardiac Enzymes: No results found for this basename: CKTOTAL:3,CKMB:3,CKMBINDEX:3,TROPONINI:3 in the last 72 hours BNP: No results found for this basename: PROBNP:3 in the  last 72 hours D-Dimer: No results found for this basename: DDIMER:2 in the last 72 hours CBG: No results found for this basename: GLUCAP:6 in the last 72 hours Hemoglobin A1C: No results found for this basename: HGBA1C in the last 72 hours Fasting Lipid Panel: No results found for this basename: CHOL,HDL,LDLCALC,TRIG,CHOLHDL,LDLDIRECT in the last 72 hours Thyroid Function Tests: No results found for this basename: TSH,T4TOTAL,FREET4,T3FREE,THYROIDAB in the last 72 hours Anemia Panel: No results found for this basename: VITAMINB12,FOLATE,FERRITIN,TIBC,IRON,RETICCTPCT in the last 72 hours Coagulation:  Basename 03/11/12 0556 03/10/12 0531  LABPROT 18.2* 17.0*  INR 1.48 1.36   Urine Drug Screen: Drugs of Abuse     Component Value Date/Time   LABOPIA NONE DETECTED 01/10/2010 1300   COCAINSCRNUR NONE DETECTED 01/10/2010 1300   LABBENZ NONE DETECTED 01/10/2010 1300   AMPHETMU NONE DETECTED 01/10/2010 1300   THCU POSITIVE* 01/10/2010 1300   LABBARB  Value: NONE DETECTED        DRUG SCREEN FOR MEDICAL PURPOSES ONLY.  IF CONFIRMATION IS NEEDED FOR ANY PURPOSE, NOTIFY LAB WITHIN 5 DAYS.        LOWEST DETECTABLE LIMITS FOR URINE DRUG SCREEN Drug Class       Cutoff (ng/mL) Amphetamine      1000 Barbiturate      200  Benzodiazepine   200 Tricyclics       300 Opiates          300 Cocaine          300 THC              50 01/10/2010 1300    Alcohol Level: No results found for this basename: ETH:2 in the last 72 hours Urinalysis: No results found for this basename: COLORURINE:2,APPERANCEUR:2,LABSPEC:2,PHURINE:2,GLUCOSEU:2,HGBUR:2,BILIRUBINUR:2,KETONESUR:2,PROTEINUR:2,UROBILINOGEN:2,NITRITE:2,LEUKOCYTESUR:2 in the last 72 hours Misc. Labs:   Micro: No results found for this or any previous visit (from the past 240 hour(s)).  Studies/Results: US Abdomen Complete  03/10/2012  *RADIOLOGY REPORT*  Clinical Data:  Nausea, vomiting.  History of cholecystectomy.  COMPLETE ABDOMINAL ULTRASOUND  Comparison:   Ultrasound the abdomen 10/08/2003  Findings:  Gallbladder:  The gallbladder surgically absent.  Common bile duct:  There is limited visualization of the common bile duct which appears to measure 4.4 mm.  Liver:  Liver is homogeneous in echotexture without focal mass.  IVC:  Appears normal.  Pancreas:  There is limited visualization of pancreatic tail. Otherwise, the visualized portion of the pancreas has a normal appearance.  Spleen:  The spleen has a normal appearance, 6.0 cm in length.  Right Kidney:  The right kidney has a normal appearance, 10.5 cm in length.  Left Kidney:  The left kidney has a normal appearance, 11.3 cm in length.  Abdominal aorta:  The abdominal aorta is not aneurysmal, 2.3 cm maximum diameter.  IMPRESSION:  1.  Status post cholecystectomy. 2.  No evidence for acute abnormality of the abdomen.   Original Report Authenticated By: Patterson Hammersmith, M.D.     Medications:  Scheduled:    . enoxaparin (LOVENOX) injection  150 mg Subcutaneous Q24H  . moxifloxacin  400 mg Intravenous Q24H  . multivitamin with minerals  1 tablet Oral Daily  . ondansetron (ZOFRAN) IV  4 mg Intravenous Q6H  . pantoprazole (PROTONIX) IV  40 mg Intravenous Q24H  . polyethylene glycol  17 g Oral Daily  . sodium chloride  3 mL Intravenous Q12H  . sodium chloride      . sodium chloride      . sodium chloride      . sodium chloride      . sodium chloride      . warfarin  10 mg Oral ONCE-1800  . warfarin  15 mg Oral Once  . Warfarin - Pharmacist Dosing Inpatient   Does not apply q1800  . DISCONTD: enoxaparin (LOVENOX) injection  1 mg/kg Subcutaneous Q12H   Continuous:    . DISCONTD: dextrose 5 % and 0.9 % NaCl with KCl 20 mEq/L 100 mL/hr at 03/10/12 0601   WUJ:WJXBJY chloride, acetaminophen, acetaminophen, bisacodyl, fentaNYL, guaiFENesin-dextromethorphan, magnesium hydroxide, promethazine, sodium chloride  Assessment: Principal Problem:  *Pulmonary embolus with infarction Active Problems:   Tobacco abuse  Obesity  Menorrhagia  Nausea and vomiting  Lupus anticoagulant positive     1. Pulmonary embolism with infarction. She continues to have pleuritic chest pain associated with it, but overall less pain. She is using the incentive spirometry. She is now tolerating fentanyl. Avelox was started empirically. Her hypercoagulable panel is positive for lupus anticoagulant. This along with oral contraceptive agents and tobacco precipitated the PE. It is possible she may require lifelong anticoagulation.  Nausea and vomiting. Resolved. Ultrasound of her abdomen unremarkable. This is still thought to be secondary to both Vicodin and morphine. Both have been discontinued. Constipation could also be a contributor.  Her lipase is within normal limits. Her SGOT is only slightly elevated. She has a history of cholecystectomy. She is on IV Protonix, Zofran, and MiraLax. We'll change Protonix and Zofran to by mouth.  Tobacco abuse and marijuana use. The patient was advised to stop smoking both.    Plan:  1. Change to Zofran to when necessary. Change protonix to by mouth. 2. Encourage increase in activity. 3. Will prepare the patient to go home tomorrow on Lovenox and Coumadin.   LOS: 3 days   Taygen Acklin 03/11/2012, 9:17 AM

## 2012-03-12 LAB — CBC
HCT: 36.3 % (ref 36.0–46.0)
Hemoglobin: 12.2 g/dL (ref 12.0–15.0)
MCHC: 33.6 g/dL (ref 30.0–36.0)
RBC: 4.12 MIL/uL (ref 3.87–5.11)
WBC: 4.2 10*3/uL (ref 4.0–10.5)

## 2012-03-12 LAB — PROTIME-INR
INR: 1.77 — ABNORMAL HIGH (ref 0.00–1.49)
Prothrombin Time: 20.9 seconds — ABNORMAL HIGH (ref 11.6–15.2)

## 2012-03-12 MED ORDER — ENOXAPARIN SODIUM 150 MG/ML ~~LOC~~ SOLN: 150.0000 mg | SUBCUTANEOUS | Status: DC

## 2012-03-12 MED ORDER — ACETAMINOPHEN 500 MG PO TABS: 1000.0000 mg | ORAL_TABLET | Freq: Four times a day (QID) | ORAL | Status: DC | PRN

## 2012-03-12 MED ORDER — POLYETHYLENE GLYCOL 3350 17 G PO PACK: 17.0000 g | PACK | Freq: Every day | ORAL | Status: AC

## 2012-03-12 MED ORDER — COUMADIN 2.5 MG PO TABS: ORAL_TABLET | ORAL | Status: DC

## 2012-03-12 MED ORDER — TRAMADOL HCL 50 MG PO TABS: 50.0000 mg | ORAL_TABLET | Freq: Four times a day (QID) | ORAL | Status: DC | PRN

## 2012-03-12 NOTE — Discharge Summary (Signed)
Physician Discharge Summary  Holly Hodge:[id number] DOB: 07/26/1985 DOA: 03/08/2012  PCP: Holly Ribas, MD  Admit date: 03/08/2012 Discharge date: 03/12/2012  Recommendations for Outpatient Follow-up:  1. The patient was discharged to home in improved condition. She will followup with her primary care physician Dr. Phillips Hodge next week as scheduled.  Discharge Diagnoses:  1. Large left-sided pulmonary embolus with infarction. The patient's INR was 1.77 upon discharge. She was discharged on Lovenox and Coumadin. 2. Lupus anticoagulant positive. 3. Chronic menorrhagia. 4. Nausea and vomiting, possibly secondary to pleuritic pain and/or opiates. 5. Tobacco abuse and marijuana use The patient was strongly advised to stop smoking both. 6. Obesity.  Discharge Condition: Improved.  Diet recommendation: Heart healthy.  Filed Weights   03/08/12 0749 03/08/12 1335  Weight: 102.967 kg (227 lb) 103.4 kg (227 lb 15.3 oz)    History of present illness:  The patient is a 26 are old woman with a past medical history significant for menorrhagia. She had taken oral contraceptive agents to regulate her menstrual periods. She presented to the emergency department on 03/08/2012 with a chief complaint of pleuritic chest pain, shortness of breath, and cough. In the emergency department, she was hemodynamically stable and afebrile. She was oxygenating 99% on nasal cannula oxygen. Her d-dimer was elevated at 2.95. Subsequently, CT angiogram of her chest revealed a large pulmonary embolus to the left lower lung and probable small area of infarction. She was admitted for further evaluation and management.  Hospital Course:  The patient was started on Lovenox and Coumadin. Her pain was treated with as needed IV hydromorphone or as needed oral Vicodin. Oxygen was applied to keep her oxygen saturations greater than 92%. She had been taking oral contraceptive agents for regulation of her menstrual periods.  Obviously, it was not continued due to to its thromboembolic effects. Also, she was advised to stop smoking as and it was felt to be contributing to her hypercoagulable state. For further evaluation, a hypercoagulable panel was ordered. It revealed lupus anticoagulant positivity. The lupus anticoagulant positivity, oral contraceptive agent, and tobacco use, all contributed to the patient's susceptibility to pulmonary embolism. This was explained to the patient. She was advised to discuss an alternate way to treat her menorrhagia with her gynecologist. She had been thinking about a hysterectomy or another means to regulate her menstrual bleeding. She was also contemplating getting her tubes tied for prevention of pregnancy. The patient was advised to do everything she could to avoid becoming pregnant while on warfarin because of its to teratogenic. She voiced understanding.  The patient experience several episodes of nausea and vomiting during the hospitalization. She had no complaints of abdominal pain. This was associated with hydromorphone and hydrocodone administration for pain. Although the patient had tolerated Vicodin in the past, it appeared that there was a temporal relationship between taking it and vomiting. These medications were discontinued in favor of fentanyl intravenously. She tolerated it. Protonix was given empirically. For further evaluation, a number of studies were ordered. Ultrasound of her abdomen revealed a cholecystectomy, but no acute findings. Her lipase was within normal limits. Her LFTs were within normal limits with exception of a mildly elevated AST, likely secondary to fatty liver. Eventually, her nausea and vomiting resolved. Her diet was advanced which she tolerated well.   The patient became much less symptomatic. She had quite a bit of pleuritic chest pain during hospitalization, but it lessened at the time of discharge. She was given several doses of Avelox empirically,  however, there was no obvious evidence of pneumonia and therefore Avelox was discontinued upon discharge. Her INR was 1.77 upon discharge. The Coumadin dosing was adjusted by the pharmacy staff. The patient had received 15 mg of Coumadin last night and 10 mg of Coumadin the day before. She was instructed on self administration of Lovenox. She demonstrated good technique. She desired to go home. Therefore, she was discharged to home on Lovenox injections as a bridge and Coumadin at 7.5 mg daily. She will followup with Dr. Phillips Hodge in 3 days.  Procedures:  None  Consultations:  None  Discharge Exam: Filed Vitals:   03/11/12 0545 03/11/12 1353 03/11/12 2030 03/12/12 0407  BP: 106/70 116/77 114/73 111/81  Pulse: 82 108 80 75  Temp: 98.4 F (36.9 C) 99.1 F (37.3 C) 98.7 F (37.1 C) 98.4 F (36.9 C)  TempSrc: Oral Oral Oral Oral  Resp: 20 18 18 18   Height:      Weight:      SpO2: 94% 100% 99% 98%    General: Obese 26 year old African American woman, sitting up in bed, in no acute distress. Cardiovascular: S1, S2, with no murmurs rubs or gallops. Respiratory: Much less splinting. Decreased breath sounds in bases but overall, better air movement. Breathing is nonlabored at rest.  Discharge Instructions  Discharge Orders    Future Orders Please Complete By Expires   Diet - low sodium heart healthy      Increase activity slowly      Discharge instructions      Comments:   Do not take your oral contraceptive agent. Do not smoke. You'll need to discuss alternate methods for birth control and treatment of your irregular menstrual bleeding with your gynecologist.     Medication List  As of 03/12/2012  9:36 AM   STOP taking these medications         ibuprofen 200 MG tablet      ZENCHENT 0.4-35 MG-MCG tablet         TAKE these medications         acetaminophen 500 MG tablet   Commonly known as: TYLENOL   Take 2 tablets (1,000 mg total) by mouth every 6 (six) hours as needed for  pain. Do not exceed 4000 mg in 24 hours.      COUMADIN 2.5 MG tablet   Generic drug: warfarin   Take 3 tablets (a total of 7.5 mg) each day at approximately 6:00 PM. Adjustments will be made by your primary care physician.      enoxaparin 150 MG/ML injection   Commonly known as: LOVENOX   Inject 1 mL (150 mg total) into the skin daily.      famotidine 20 MG tablet   Commonly known as: PEPCID   Take 20 mg by mouth daily as needed. Heartburn      multivitamin with minerals Tabs   Take 1 tablet by mouth daily.      polyethylene glycol packet   Commonly known as: MIRALAX / GLYCOLAX   Take 17 g by mouth daily. For constipation.      promethazine 25 MG tablet   Commonly known as: PHENERGAN   Take 25 mg by mouth every 6 (six) hours as needed. Nausea and Vomiting      traMADol 50 MG tablet   Commonly known as: ULTRAM   Take 1 tablet (50 mg total) by mouth every 6 (six) hours as needed for pain.  Follow-up Information    Follow up with Holly Ribas, MD today. (sept 10th 3:30 pm/ to check INR for FU PE)    Contact information:   [address] [place] [address] [place] [address] [phone]           The results of significant diagnostics from this hospitalization (including imaging, microbiology, ancillary and laboratory) are listed below for reference.    Significant Diagnostic Studies: Dg Chest 2 View  03/08/2012  *RADIOLOGY REPORT*  Clinical Data: Cough and chest pain.  CHEST - 2 VIEW  Comparison: Chest x-ray 12/13/2009.  Findings: Lung volumes are normal.  No consolidative airspace disease.  No pleural effusions.  No pneumothorax.  No pulmonary nodule or mass noted.  Pulmonary vasculature and the cardiomediastinal silhouette are within normal limits.  IMPRESSION: 1. No radiographic evidence of acute cardiopulmonary disease.   Original Report Authenticated By: Florencia Reasons, M.D.    Ct Angio Chest Pe W/cm &/or Wo Cm  03/08/2012   *RADIOLOGY REPORT*  Clinical Data: Cough.  Pleuritic left sided chest pain for the past 3 days.  Elevated D-dimer.  CT ANGIOGRAPHY CHEST  Technique:  Multidetector CT imaging of the chest using the standard protocol during bolus administration of intravenous contrast. Multiplanar reconstructed images including MIPs were obtained and reviewed to evaluate the vascular anatomy.  Contrast: OMNIPAQUE IOHEXOL 350 MG/ML SOLN  Comparison: No priors.  Findings:  Mediastinum: In the distal aspect of the left lower lobe pulmonary artery there is a lobar sized filling defect which extends into several segmental and subsegmental sized branches to the left lower lobe, compatible with pulmonary embolism.  Some of these defects are completely occlusive, while others are nonocclusive.  No other filling defects are noted in the pulmonary arterial tree to lower right lung. Heart size is normal. There is no significant pericardial fluid, thickening or pericardial calcification.  1 cm short-axis left hilar lymph node.  No other definite pathologically enlarged mediastinal or hilar lymph nodes are noted.  The esophagus is unremarkable in appearance.  Lungs/Pleura: In the posterobasal segment of the left lower lobe there is some subtle ground-glass attenuation air space disease, most consistent with pulmonary hemorrhage related to pulmonary infarction.  No other acute consolidative air space disease is noted.  No pleural effusions.  No definite suspicious appearing pulmonary nodules or masses are identified.  Upper Abdomen:  Unremarkable.  Musculoskeletal: There are no aggressive appearing lytic or blastic lesions noted in the visualized portions of the skeleton.  IMPRESSION: 1.  Study is positive for a large pulmonary embolism to the left lower lobe involving lobar, segmental and subsegmental sized branches.  Some of these filling defects are completely occlusive, and there does appear to be a small amount of pulmonary hemorrhage  in the left lower lobe related to this pulmonary infarction. 2.  Nonspecific 1 cm short axis left hilar lymph nodes is presumably reactive.  These results were called by telephone on 03/08/2012 at 10:10 a.m. to Dr. Effie Shy, who verbally acknowledged these results.   Original Report Authenticated By: Florencia Reasons, M.D.    US Abdomen Complete  03/10/2012  *RADIOLOGY REPORT*  Clinical Data:  Nausea, vomiting.  History of cholecystectomy.  COMPLETE ABDOMINAL ULTRASOUND  Comparison:  Ultrasound the abdomen 10/08/2003  Findings:  Gallbladder:  The gallbladder surgically absent.  Common bile duct:  There is limited visualization of the common bile duct which appears to measure 4.4 mm.  Liver:  Liver is homogeneous in echotexture  without focal mass.  IVC:  Appears normal.  Pancreas:  There is limited visualization of pancreatic tail. Otherwise, the visualized portion of the pancreas has a normal appearance.  Spleen:  The spleen has a normal appearance, 6.0 cm in length.  Right Kidney:  The right kidney has a normal appearance, 10.5 cm in length.  Left Kidney:  The left kidney has a normal appearance, 11.3 cm in length.  Abdominal aorta:  The abdominal aorta is not aneurysmal, 2.3 cm maximum diameter.  IMPRESSION:  1.  Status post cholecystectomy. 2.  No evidence for acute abnormality of the abdomen.   Original Report Authenticated By: Patterson Hammersmith, M.D.     Microbiology: No results found for this or any previous visit (from the past 240 hour(s)).   Labs: Basic Metabolic Panel:  Lab 03/10/12 1191 03/09/12 0506 03/08/12 0829  NA 138 137 138  K 4.3 3.9 3.7  CL 102 103 104  CO2 26 25 24   GLUCOSE 110* 105* 103*  BUN 5* 8 8  CREATININE 0.74 0.75 0.86  CALCIUM 9.2 9.4 9.1  MG -- -- --  PHOS -- -- --   Liver Function Tests:  Lab 03/09/12 0506  AST 46*  ALT 26  ALKPHOS 87  BILITOT 0.4  PROT 7.2  ALBUMIN 3.3*    Lab 03/10/12 0531 03/09/12 0506  LIPASE 14 17  AMYLASE -- --   No results  found for this basename: AMMONIA:5 in the last 168 hours CBC:  Lab 03/12/12 0705 03/10/12 0531 03/08/12 0829  WBC 4.2 8.0 7.0  NEUTROABS -- -- 4.4  HGB 12.2 12.5 13.0  HCT 36.3 37.3 37.7  MCV 88.1 88.2 87.3  PLT 281 248 239   Cardiac Enzymes: No results found for this basename: CKTOTAL:5,CKMB:5,CKMBINDEX:5,TROPONINI:5 in the last 168 hours BNP: BNP (last 3 results) No results found for this basename: PROBNP:3 in the last 8760 hours CBG: No results found for this basename: GLUCAP:5 in the last 168 hours  Time coordinating discharge: Greater than 30 minutes  Signed:  Yides Saidi  [facility] 03/12/2012, 9:36 AM

## 2012-03-12 NOTE — Progress Notes (Signed)
Patient received discharge instructions along with follow up appointments and prescriptions. Patient verbalized understanding of all instructions. Patient was escorted by staff to vehicle. Patient discharged to home in stable condition. 

## 2012-03-23 ENCOUNTER — Emergency Department (HOSPITAL_COMMUNITY)
Admission: EM | Admit: 2012-03-23 | Discharge: 2012-03-23 | Disposition: A | Discharge status: Home / Self Care | Payer: BC Managed Care – PPO | Attending: Emergency Medicine | Admitting: Emergency Medicine

## 2012-03-23 ENCOUNTER — Encounter (HOSPITAL_COMMUNITY): Payer: Self-pay | Admitting: Emergency Medicine

## 2012-03-23 DIAGNOSIS — Z79899 Other long term (current) drug therapy: Secondary | ICD-10-CM | POA: Insufficient documentation

## 2012-03-23 DIAGNOSIS — Z7901 Long term (current) use of anticoagulants: Secondary | ICD-10-CM | POA: Insufficient documentation

## 2012-03-23 DIAGNOSIS — L509 Urticaria, unspecified: Secondary | ICD-10-CM

## 2012-03-23 MED ORDER — HYDROXYZINE PAMOATE 25 MG PO CAPS: ORAL_CAPSULE | ORAL | Status: DC

## 2012-03-23 MED ORDER — HYDROXYZINE HCL 25 MG PO TABS
50.0000 mg | ORAL_TABLET | Freq: Once | ORAL | Status: AC
  Administered 2012-03-23: 50 mg via ORAL
  Filled 2012-03-23: qty 1

## 2012-03-23 MED ORDER — PREDNISONE 20 MG PO TABS
60.0000 mg | ORAL_TABLET | Freq: Once | ORAL | Status: AC
  Administered 2012-03-23: 60 mg via ORAL
  Filled 2012-03-23: qty 3
  Filled 2012-03-23: qty 1

## 2012-03-23 MED ORDER — FAMOTIDINE 20 MG PO TABS
20.0000 mg | ORAL_TABLET | Freq: Once | ORAL | Status: AC
  Administered 2012-03-23: 20 mg via ORAL
  Filled 2012-03-23: qty 1

## 2012-03-23 MED ORDER — HYDROXYZINE HCL 25 MG PO TABS
ORAL_TABLET | ORAL | Status: AC
  Filled 2012-03-23: qty 1

## 2012-03-23 MED ORDER — CETIRIZINE HCL 10 MG PO CHEW: CHEWABLE_TABLET | ORAL | Status: DC

## 2012-03-23 NOTE — ED Provider Notes (Signed)
Medical screening examination/treatment/procedure(s) were performed by non-physician practitioner and as supervising physician I was immediately available for consultation/collaboration.   Seraphine Gudiel L Mcihael Hinderman, MD 03/23/12 1541 

## 2012-03-23 NOTE — ED Notes (Signed)
Patient reports rash and itching that started on abdomen where lovenox shots were given to PE in early September. Patient reports itching all over.

## 2012-03-23 NOTE — ED Notes (Signed)
PA currently at bedside. PT states she was recently admitted for PE. States on coumadin now. States rash to abdomen, large, raised, red area to lower abdomen. Pt states rash began while hospitalized. Also states intermittent pain to left rib area when taking a deep breath, described as a catching pain.

## 2012-03-23 NOTE — ED Provider Notes (Signed)
History     CSN: [id number]  Arrival date & time 03/23/12  0906   First MD Initiated Contact with Patient 03/23/12 [phone]      Chief Complaint  Patient presents with  . Rash  . Pruritis    (Consider location/radiation/quality/duration/timing/severity/associated sxs/prior treatment) Patient is a 26 y.o. female presenting with rash. The history is provided by the patient.  Rash  This is a new problem. The current episode started more than 2 days ago. The problem has been gradually worsening. The problem is associated with an unknown factor. There has been no fever. The rash is present on the face, abdomen and back. The patient is experiencing no pain. Associated symptoms include itching. She has tried cold cream and anti-itch cream for the symptoms. The treatment provided no relief.    Past Medical History  Diagnosis Date  . Anxiety   . 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/11/2012    Past Surgical History  Procedure Date  . Cesarean section     x2  . Cholecystectomy 2005    Family History  Problem Relation Age of Onset  . Hypertension Mother   . Hypertension Father   . Stroke Father   . Cancer Other     History  Substance Use Topics  . Smoking status: Current Every Day Smoker -- 0.3 packs/day for 6 years    Types: Cigarettes  . Smokeless tobacco: Never Used  . Alcohol Use: Yes     occasionally-holidays    OB History    Grav Para Term Preterm Abortions TAB SAB Ect Mult Living   2 2 2       2       Review of Systems  Constitutional: Negative for activity change.       All ROS Neg except as noted in HPI  HENT: Negative for nosebleeds and neck pain.   Eyes: Negative for photophobia and discharge.  Respiratory: Negative for cough, shortness of breath and wheezing.   Cardiovascular: Negative for chest pain and palpitations.  Gastrointestinal: Negative for abdominal pain and blood in stool.  Genitourinary: Negative for dysuria, frequency and hematuria.  Musculoskeletal:  Negative for back pain and arthralgias.  Skin: Positive for itching and rash.  Neurological: Negative for dizziness, seizures and speech difficulty.  Psychiatric/Behavioral: Negative for hallucinations and confusion. The patient is nervous/anxious.     Allergies  Review of patient's allergies indicates no known allergies.  Home Medications   Current Outpatient Rx  Name Route Sig Dispense Refill  . HYDROCODONE-ACETAMINOPHEN 5-500 MG PO TABS Oral Take 1 tablet by mouth every 6 (six) hours as needed. Pain    . ADULT MULTIVITAMIN W/MINERALS CH Oral Take 1 tablet by mouth daily.    Marland Kitchen ONDANSETRON HCL 4 MG PO TABS Oral Take 4 mg by mouth every 8 (eight) hours as needed. Nausea and Vomiting    . TRIAMCINOLONE ACETONIDE 0.1 % EX CREA Topical Apply 1 application topically 2 (two) times daily.    . WARFARIN SODIUM 2.5 MG PO TABS Oral Take 7.5 mg by mouth daily. Take 3 tablets (a total of 7.5 mg) each day at approximately 6:00 PM. Adjustments will be made by your primary care physician.      BP 100/60  Pulse 70  Temp 98.6 F (37 C) (Oral)  Resp 20  Ht 5\' 5"  (1.651 m)  Wt 221 lb (100.245 kg)  BMI 36.78 kg/m2  SpO2 100%  LMP 03/09/2012  Physical Exam  Nursing note and vitals reviewed. Constitutional: She is  oriented to person, place, and time. She appears well-developed and well-nourished.  Non-toxic appearance.  HENT:  Head: Normocephalic.  Right Ear: Tympanic membrane and external ear normal.  Left Ear: Tympanic membrane and external ear normal.       Increase redness of the left forehead and scalp.  Eyes: EOM and lids are normal. Pupils are equal, round, and reactive to light.  Neck: Normal range of motion. Neck supple. Carotid bruit is not present.  Cardiovascular: Normal rate, regular rhythm, normal heart sounds, intact distal pulses and normal pulses.   Pulmonary/Chest: Breath sounds normal. No respiratory distress.  Abdominal: Soft. Bowel sounds are normal. There is no tenderness.  There is no guarding.       Hives along the right and left lower abd.  Musculoskeletal: Normal range of motion.       Hives on the upper arms. None in the web spaces.  Lymphadenopathy:       Head (right side): No submandibular adenopathy present.       Head (left side): No submandibular adenopathy present.    She has no cervical adenopathy.  Neurological: She is alert and oriented to person, place, and time. She has normal strength. No cranial nerve deficit or sensory deficit.  Skin: Skin is warm and dry.  Psychiatric: She has a normal mood and affect. Her speech is normal.    ED Course  Procedures (including critical care time)  Labs Reviewed - No data to display No results found.   No diagnosis found.    MDM  I have reviewed nursing notes, vital signs, and all appropriate lab and imaging results for this patient. Itching and hives improved after vistaril and prednisone. Rx for both given to the patient. Pt to see the allergist for testiing.       Kathie Dike, [place] 03/23/12 1255

## 2012-09-25 ENCOUNTER — Emergency Department (HOSPITAL_COMMUNITY)
Admission: EM | Admit: 2012-09-25 | Discharge: 2012-09-26 | Disposition: A | Discharge status: Home / Self Care | Payer: Self-pay | Attending: Emergency Medicine | Admitting: Emergency Medicine

## 2012-09-25 ENCOUNTER — Encounter (HOSPITAL_COMMUNITY): Payer: Self-pay | Admitting: Emergency Medicine

## 2012-09-25 DIAGNOSIS — F411 Generalized anxiety disorder: Secondary | ICD-10-CM | POA: Insufficient documentation

## 2012-09-25 DIAGNOSIS — Z79899 Other long term (current) drug therapy: Secondary | ICD-10-CM | POA: Insufficient documentation

## 2012-09-25 DIAGNOSIS — R6884 Jaw pain: Secondary | ICD-10-CM | POA: Insufficient documentation

## 2012-09-25 DIAGNOSIS — F17200 Nicotine dependence, unspecified, uncomplicated: Secondary | ICD-10-CM | POA: Insufficient documentation

## 2012-09-25 DIAGNOSIS — Z7901 Long term (current) use of anticoagulants: Secondary | ICD-10-CM | POA: Insufficient documentation

## 2012-09-25 MED ORDER — IBUPROFEN 800 MG PO TABS
800.0000 mg | ORAL_TABLET | Freq: Once | ORAL | Status: AC
  Administered 2012-09-25: 800 mg via ORAL
  Filled 2012-09-25: qty 1

## 2012-09-25 MED ORDER — PENICILLIN V POTASSIUM 250 MG PO TABS
500.0000 mg | ORAL_TABLET | Freq: Once | ORAL | Status: AC
  Administered 2012-09-25: 500 mg via ORAL
  Filled 2012-09-25: qty 2

## 2012-09-25 NOTE — ED Provider Notes (Signed)
History     CSN: [id number]  Arrival date & time 09/25/12  2220   First MD Initiated Contact with Patient 09/25/12 2306      Chief Complaint  Patient presents with  . Dental Pain    (Consider location/radiation/quality/duration/timing/severity/associated sxs/prior treatment) HPI TIEA Holly Hodge is a 27 y.o. female who presents to the Emergency Department complaining of left jaw pain and pain radiates to facial pain. She was seen at [facility] last week and put on an antibiotic. She has no money and was unable to fill the prescription. She is here with similar pain.   PCP [name]  Past Medical History  Diagnosis Date  . Anxiety   . 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/11/2012    Past Surgical History  Procedure Laterality Date  . Cesarean section      x2  . Cholecystectomy  2005    Family History  Problem Relation Age of Onset  . Hypertension Mother   . Hypertension Father   . Stroke Father   . Cancer Other     History  Substance Use Topics  . Smoking status: Current Every Day Smoker -- 0.33 packs/day for 6 years    Types: Cigarettes  . Smokeless tobacco: Never Used  . Alcohol Use: Yes     Comment: occasionally-holidays    OB History   Grav Para Term Preterm Abortions TAB SAB Ect Mult Living   2 2 2       2       Review of Systems  Constitutional: Negative for fever.       10 Systems reviewed and are negative for acute change except as noted in the HPI.  HENT: Negative for congestion.        Left jaw pain  Eyes: Negative for discharge and redness.  Respiratory: Negative for cough and shortness of breath.   Cardiovascular: Negative for chest pain.  Gastrointestinal: Negative for vomiting and abdominal pain.  Musculoskeletal: Negative for back pain.  Skin: Negative for rash.  Neurological: Negative for syncope, numbness and headaches.  Psychiatric/Behavioral:       No behavior change.    Allergies  Review of patient's allergies indicates no known  allergies.  Home Medications   Current Outpatient Rx  Name  Route  Sig  Dispense  Refill  . cetirizine (ZYRTEC) 10 MG chewable tablet      1 po q am.   10 tablet   1   . HYDROcodone-acetaminophen (VICODIN) 5-500 MG per tablet   Oral   Take 1 tablet by mouth every 6 (six) hours as needed. Pain         . hydrOXYzine (VISTARIL) 25 MG capsule      1 po qhs, or q6h prn itching   20 capsule   0   . Multiple Vitamin (MULTIVITAMIN WITH MINERALS) TABS   Oral   Take 1 tablet by mouth daily.         . ondansetron (ZOFRAN) 4 MG tablet   Oral   Take 4 mg by mouth every 8 (eight) hours as needed. Nausea and Vomiting         . triamcinolone cream (KENALOG) 0.1 %   Topical   Apply 1 application topically 2 (two) times daily.         Marland Kitchen warfarin (COUMADIN) 2.5 MG tablet   Oral   Take 7.5 mg by mouth daily. Take 3 tablets (a total of 7.5 mg) each day at approximately 6:00 PM. Adjustments will be  made by your primary care physician.           BP 127/73  Pulse 79  Temp(Src) 98.4 F (36.9 C) (Oral)  Resp 18  Ht 5\' 5"  (1.651 m)  Wt 215 lb (97.523 kg)  BMI 35.78 kg/m2  SpO2 100%  LMP 09/05/2012  Physical Exam  Nursing note and vitals reviewed. Constitutional: She appears well-developed and well-nourished.  Awake, alert, nontoxic appearance.  HENT:  Head: Normocephalic and atraumatic.  Right Ear: External ear normal.  Left Ear: External ear normal.  Mouth/Throat: Oropharynx is clear and moist.  Good dentition. NO facial pain with palpation  Eyes: EOM are normal. Pupils are equal, round, and reactive to light. Right eye exhibits no discharge. Left eye exhibits no discharge.  Neck: Neck supple.  Cardiovascular: Normal rate and intact distal pulses.   Pulmonary/Chest: Effort normal and breath sounds normal. She exhibits no tenderness.  Abdominal: Soft. Bowel sounds are normal. There is no tenderness. There is no rebound.  Musculoskeletal: She exhibits no tenderness.   Baseline ROM, no obvious new focal weakness.  Neurological:  Mental status and motor strength appears baseline for patient and situation.  Skin: No rash noted.  Psychiatric: She has a normal mood and affect.    ED Course  Procedures (including critical care time)     MDM  Patient with left jaw pain that was treated by [facility] but patient did not have any money and could not get prescriptions.  Here with continued pain. Given ibuprofen and antibiotics. Pt stable in ED with no significant deterioration in condition.The patient appears reasonably screened and/or stabilized for discharge and I doubt any other medical condition or other [facility] requiring further screening, evaluation, or treatment in the ED at this time prior to discharge.  MDM Reviewed: nursing note and vitals Interpretation: labs           Nicoletta Dress. Colon Branch, MD 09/26/12 1610

## 2012-09-25 NOTE — ED Notes (Signed)
Patient complaining of pain in gums. Reports feels like a toothache but she thinks it is coming from her gums.

## 2012-09-25 NOTE — ED Notes (Signed)
Pt complaining of dental pain to left upper and lower back side of mouth, states she thinks her gums are hurting but that the whole side of her face is painful, no obvious swelling noted.

## 2012-09-26 ENCOUNTER — Encounter (HOSPITAL_COMMUNITY): Payer: Self-pay | Admitting: *Deleted

## 2012-09-26 ENCOUNTER — Emergency Department (HOSPITAL_COMMUNITY)
Admission: EM | Admit: 2012-09-26 | Discharge: 2012-09-26 | Disposition: A | Discharge status: Home / Self Care | Payer: Self-pay | Attending: Emergency Medicine | Admitting: Emergency Medicine

## 2012-09-26 DIAGNOSIS — F17200 Nicotine dependence, unspecified, uncomplicated: Secondary | ICD-10-CM | POA: Insufficient documentation

## 2012-09-26 DIAGNOSIS — Z8739 Personal history of other diseases of the musculoskeletal system and connective tissue: Secondary | ICD-10-CM | POA: Insufficient documentation

## 2012-09-26 DIAGNOSIS — J329 Chronic sinusitis, unspecified: Secondary | ICD-10-CM | POA: Insufficient documentation

## 2012-09-26 DIAGNOSIS — Z8659 Personal history of other mental and behavioral disorders: Secondary | ICD-10-CM | POA: Insufficient documentation

## 2012-09-26 DIAGNOSIS — J3489 Other specified disorders of nose and nasal sinuses: Secondary | ICD-10-CM | POA: Insufficient documentation

## 2012-09-26 MED ORDER — FLUTICASONE PROPIONATE 50 MCG/ACT NA SUSP: 2.0000 | Freq: Every day | NASAL | Status: DC

## 2012-09-26 MED ORDER — AZITHROMYCIN 250 MG PO TABS: ORAL_TABLET | ORAL | Status: DC

## 2012-09-26 MED ORDER — PENICILLIN V POTASSIUM 500 MG PO TABS: 500.0000 mg | ORAL_TABLET | Freq: Four times a day (QID) | ORAL | Status: AC

## 2012-09-26 MED ORDER — TRAMADOL HCL 50 MG PO TABS: 50.0000 mg | ORAL_TABLET | Freq: Four times a day (QID) | ORAL | Status: DC | PRN

## 2012-09-26 MED ORDER — PENICILLIN V POTASSIUM 500 MG PO TABS: 500.0000 mg | ORAL_TABLET | Freq: Four times a day (QID) | ORAL | Status: DC

## 2012-09-26 NOTE — ED Notes (Signed)
Left temple pain and pain to left side of nose and gums hurting since yesterday.  Seen here yesterday for same.  States is not better.

## 2012-09-26 NOTE — ED Provider Notes (Signed)
History    This chart was scribed for Holly Crease, MD by Gerlean Ren, ED Scribe. This patient was seen in room APA07/APA07 and the patient's care was started at 12:39 PM     CSN: [id number]  Arrival date & time 09/26/12  1119   First MD Initiated Contact with Patient 09/26/12 1230      Chief Complaint  Patient presents with  . Headache    The history is provided by the patient. No language interpreter was used.  Holly Hodge is a 27 y.o. female who presents to the Emergency Department complaining of HA localized to left temporal area that pt states started 2 weeks ago in her left upper gum area.  Pt denies any specific tooth pain and reports pain with chewing is exclusive to the left upper gums.  Pt reports h/o sinus congestion but states she has never had pain in this location with prior sinus congestion.  Pt reports associated nasal congestion.   Past Medical History  Diagnosis Date  . Anxiety   . 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/11/2012    Past Surgical History  Procedure Laterality Date  . Cesarean section      x2  . Cholecystectomy  2005    Family History  Problem Relation Age of Onset  . Hypertension Mother   . Hypertension Father   . Stroke Father   . Cancer Other     History  Substance Use Topics  . Smoking status: Current Every Day Smoker -- 0.33 packs/day for 6 years    Types: Cigarettes  . Smokeless tobacco: Never Used  . Alcohol Use: Yes     Comment: occasionally-holidays    OB History   Grav Para Term Preterm Abortions TAB SAB Ect Mult Living   2 2 2       2       Review of Systems  HENT: Positive for sinus pressure. Negative for dental problem.   Neurological: Positive for headaches.  All other systems reviewed and are negative.    Allergies  Review of patient's allergies indicates no known allergies.  Home Medications   Current Outpatient Rx  Name  Route  Sig  Dispense  Refill  . Multiple Vitamin (MULTIVITAMIN WITH MINERALS)  TABS   Oral   Take 1 tablet by mouth daily.         . naproxen sodium (ALEVE) 220 MG tablet   Oral   Take 220 mg by mouth daily as needed (headache).         . penicillin v potassium (VEETID) 500 MG tablet   Oral   Take 1 tablet (500 mg total) by mouth 4 (four) times daily.   40 tablet   0     BP 138/79  Pulse 70  Temp(Src) 99 F (37.2 C) (Oral)  Resp 18  Ht 5\' 5"  (1.651 m)  Wt 215 lb (97.523 kg)  BMI 35.78 kg/m2  SpO2 100%  LMP 09/05/2012  Physical Exam  Nursing note and vitals reviewed. Constitutional: She is oriented to person, place, and time. She appears well-developed and well-nourished. No distress.  HENT:  Head: Normocephalic and atraumatic.  Right Ear: Hearing normal.  Nose: Left sinus exhibits maxillary sinus tenderness.  Mouth/Throat: Oropharynx is clear and moist and mucous membranes are normal.  Eyes: Conjunctivae and EOM are normal. Pupils are equal, round, and reactive to light.  Neck: Normal range of motion. Neck supple.  Cardiovascular: Normal rate, regular rhythm, S1 normal and S2 normal.  Exam reveals no gallop and no friction rub.   No murmur heard. Pulmonary/Chest: Effort normal and breath sounds normal. No respiratory distress. She exhibits no tenderness.  Abdominal: Soft. Normal appearance and bowel sounds are normal. There is no hepatosplenomegaly. There is no tenderness. There is no rebound, no guarding, no tenderness at McBurney's point and negative Murphy's sign. No hernia.  Musculoskeletal: Normal range of motion.  Neurological: She is alert and oriented to person, place, and time. She has normal strength. No cranial nerve deficit or sensory deficit. Coordination normal. GCS eye subscore is 4. GCS verbal subscore is 5. GCS motor subscore is 6.  Skin: Skin is warm, dry and intact. No rash noted. No cyanosis.  Psychiatric: She has a normal mood and affect. Her speech is normal and behavior is normal. Thought content normal.    ED Course   Procedures (including critical care time) DIAGNOSTIC STUDIES: Oxygen Saturation is 100% on room air, normal by my interpretation.    COORDINATION OF CARE: 12:32 PM- Patient informed of clinical course, understands medical decision-making process, and agrees with plan.  Labs Reviewed - No data to display No results found.   Diagnosis: Sinusitis    MDM  Patient is complaining of left facial pain around the left eye and into the teeth on the left side of her mouth. She has had sinus congestion. Patient does report a previous history of sinus problems. Patient has a normal neurologic examination. This is not the worse headache of her life. She has had gradual onset of headache and therefore I do not suspect intracranial hemorrhage as a cause of the patient's pain. Symptoms are most consistent with acute sinusitis causing left facial pain and headache. She'll be treated empirically and is to followup with her Dr. Return to the ER for symptoms worsen.  I personally performed the services described in this documentation, which was scribed in my presence. The recorded information has been reviewed and is accurate.        Holly Crease, MD 09/26/12 1343

## 2013-01-17 ENCOUNTER — Emergency Department (HOSPITAL_COMMUNITY)
Admission: EM | Admit: 2013-01-17 | Discharge: 2013-01-17 | Disposition: A | Discharge status: Home / Self Care | Payer: Self-pay | Attending: Emergency Medicine | Admitting: Emergency Medicine

## 2013-01-17 ENCOUNTER — Encounter (HOSPITAL_COMMUNITY): Payer: Self-pay

## 2013-01-17 DIAGNOSIS — Z8659 Personal history of other mental and behavioral disorders: Secondary | ICD-10-CM | POA: Insufficient documentation

## 2013-01-17 DIAGNOSIS — K2970 Gastritis, unspecified, without bleeding: Secondary | ICD-10-CM | POA: Insufficient documentation

## 2013-01-17 DIAGNOSIS — Z79899 Other long term (current) drug therapy: Secondary | ICD-10-CM | POA: Insufficient documentation

## 2013-01-17 DIAGNOSIS — R51 Headache: Secondary | ICD-10-CM | POA: Insufficient documentation

## 2013-01-17 DIAGNOSIS — Z3202 Encounter for pregnancy test, result negative: Secondary | ICD-10-CM | POA: Insufficient documentation

## 2013-01-17 DIAGNOSIS — Z86711 Personal history of pulmonary embolism: Secondary | ICD-10-CM | POA: Insufficient documentation

## 2013-01-17 DIAGNOSIS — F17200 Nicotine dependence, unspecified, uncomplicated: Secondary | ICD-10-CM | POA: Insufficient documentation

## 2013-01-17 DIAGNOSIS — K2990 Gastroduodenitis, unspecified, without bleeding: Secondary | ICD-10-CM | POA: Insufficient documentation

## 2013-01-17 HISTORY — DX: Other pulmonary embolism without acute cor pulmonale: I26.99

## 2013-01-17 LAB — URINE MICROSCOPIC-ADD ON

## 2013-01-17 LAB — URINALYSIS, ROUTINE W REFLEX MICROSCOPIC
Ketones, ur: NEGATIVE mg/dL
Leukocytes, UA: NEGATIVE
Nitrite: NEGATIVE
Specific Gravity, Urine: >1.03 — ABNORMAL HIGH (ref 1.005–1.030)
pH: 5.5 (ref 5.0–8.0)

## 2013-01-17 LAB — BASIC METABOLIC PANEL
Chloride: 107 mEq/L (ref 96–112)
GFR calc Af Amer: 89 mL/min — ABNORMAL LOW (ref >90)
Potassium: 4.3 mEq/L (ref 3.5–5.1)

## 2013-01-17 LAB — CBC WITH DIFFERENTIAL/PLATELET
Basophils Absolute: 0 10*3/uL (ref 0.0–0.1)
Basophils Relative: 0 % (ref 0–1)
MCHC: 33.7 g/dL (ref 30.0–36.0)
Neutro Abs: 2.9 10*3/uL (ref 1.7–7.7)
Neutrophils Relative %: 45 % (ref 43–77)
RDW: 13.1 % (ref 11.5–15.5)

## 2013-01-17 LAB — HEPATIC FUNCTION PANEL
Alkaline Phosphatase: 63 U/L (ref 39–117)
Bilirubin, Direct: <0.1 mg/dL (ref 0.0–0.3)
Total Protein: 6.2 g/dL (ref 6.0–8.3)

## 2013-01-17 LAB — D-DIMER, QUANTITATIVE: D-Dimer, Quant: 2.29 ug/mL-FEU — ABNORMAL HIGH (ref 0.00–0.48)

## 2013-01-17 MED ORDER — TRAMADOL HCL 50 MG PO TABS: 50.0000 mg | ORAL_TABLET | Freq: Four times a day (QID) | ORAL | Status: DC | PRN

## 2013-01-17 MED ORDER — PROMETHAZINE HCL 25 MG PO TABS: 25.0000 mg | ORAL_TABLET | Freq: Four times a day (QID) | ORAL | Status: DC | PRN

## 2013-01-17 MED ORDER — RANITIDINE HCL 150 MG PO CAPS: 150.0000 mg | ORAL_CAPSULE | Freq: Two times a day (BID) | ORAL | Status: DC

## 2013-01-17 MED ORDER — PANTOPRAZOLE SODIUM 40 MG IV SOLR
40.0000 mg | Freq: Once | INTRAVENOUS | Status: AC
  Administered 2013-01-17: 40 mg via INTRAVENOUS
  Filled 2013-01-17: qty 40

## 2013-01-17 MED ORDER — SODIUM CHLORIDE 0.9 % IV BOLUS (SEPSIS)
1000.0000 mL | Freq: Once | INTRAVENOUS | Status: AC
  Administered 2013-01-17: 1000 mL via INTRAVENOUS

## 2013-01-17 MED ORDER — HYDROMORPHONE HCL PF 1 MG/ML IJ SOLN
1.0000 mg | Freq: Once | INTRAMUSCULAR | Status: AC
  Administered 2013-01-17: 1 mg via INTRAVENOUS
  Filled 2013-01-17: qty 1

## 2013-01-17 MED ORDER — ONDANSETRON HCL 4 MG/2ML IJ SOLN
4.0000 mg | Freq: Once | INTRAMUSCULAR | Status: AC
  Administered 2013-01-17: 4 mg via INTRAVENOUS
  Filled 2013-01-17: qty 2

## 2013-01-17 NOTE — ED Notes (Signed)
Pt c/o headache and upper abd pain since Saturday.  Denies any n/v/d.  LBM was today and was normal per pt.  LMP was this past Friday.  Denies any urinary symptoms.

## 2013-01-17 NOTE — ED Notes (Signed)
Attempted IV access x 2 without success, CN will access

## 2013-01-17 NOTE — ED Provider Notes (Signed)
History    CSN: [id number] Arrival date & time 01/17/13  1247  First MD Initiated Contact with Patient 01/17/13 1508     Chief Complaint  Patient presents with  . Headache  . Abdominal Pain   (Consider location/radiation/quality/duration/timing/severity/associated sxs/prior Treatment) HPI Past Medical History  Diagnosis Date  . Anxiety   . 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/11/2012  . PE (pulmonary embolism)    Past Surgical History  Procedure Laterality Date  . Cesarean section      x2  . Cholecystectomy  2005   Family History  Problem Relation Age of Onset  . Hypertension Mother   . Hypertension Father   . Stroke Father   . Cancer Other    History  Substance Use Topics  . Smoking status: Current Every Day Smoker -- 0.33 packs/day for 6 years    Types: Cigarettes  . Smokeless tobacco: Never Used  . Alcohol Use: Yes     Comment: occasionally-holidays   OB History   Grav Para Term Preterm Abortions TAB SAB Ect Mult Living   2 2 2       2      Review of Systems  Allergies  Review of patient's allergies indicates no known allergies.  Home Medications   Current Outpatient Rx  Name  Route  Sig  Dispense  Refill  . ibuprofen (ADVIL,MOTRIN) 200 MG tablet   Oral   Take 800 mg by mouth every 6 (six) hours as needed for pain.         . Multiple Vitamin (MULTIVITAMIN WITH MINERALS) TABS   Oral   Take 1 tablet by mouth daily.         . promethazine (PHENERGAN) 25 MG tablet   Oral   Take 1 tablet (25 mg total) by mouth every 6 (six) hours as needed for nausea.   20 tablet   0   . ranitidine (ZANTAC) 150 MG capsule   Oral   Take 1 capsule (150 mg total) by mouth 2 (two) times daily.   30 capsule   0   . traMADol (ULTRAM) 50 MG tablet   Oral   Take 1 tablet (50 mg total) by mouth every 6 (six) hours as needed for pain.   15 tablet   0    BP 106/73  Pulse 58  Temp(Src) 98.4 F (36.9 C) (Oral)  Resp 18  Ht 5\' 4"  (1.626 m)  Wt 215 lb (97.523 kg)   BMI 36.89 kg/m2  SpO2 100%  LMP 01/13/2013 Physical Exam  ED Course  Procedures (including critical care time) Labs Reviewed  CBC WITH DIFFERENTIAL - Abnormal; Notable for the following:    Lymphocytes Relative 48 (*)    All other components within normal limits  BASIC METABOLIC PANEL - Abnormal; Notable for the following:    GFR calc non Af Amer 76 (*)    GFR calc Af Amer 89 (*)    All other components within normal limits  URINALYSIS, ROUTINE W REFLEX MICROSCOPIC - Abnormal; Notable for the following:    Specific Gravity, Urine >1.030 (*)    Hgb urine dipstick LARGE (*)    All other components within normal limits  D-DIMER, QUANTITATIVE - Abnormal; Notable for the following:    D-Dimer, Quant 2.29 (*)    All other components within normal limits  HEPATIC FUNCTION PANEL - Abnormal; Notable for the following:    Albumin 3.3 (*)    All other components within normal limits  URINE MICROSCOPIC-ADD ON -  Abnormal; Notable for the following:    Squamous Epithelial / LPF MANY (*)    Bacteria, UA FEW (*)    All other components within normal limits  PREGNANCY, URINE   No results found. 1. Gastritis     MDM  The chart was scribed for me under my direct supervision.  I personally performed the history, physical, and medical decision making and all procedures in the evaluation of this patient.Benny Lennert, MD 01/17/13 [phone]

## 2013-01-17 NOTE — ED Provider Notes (Signed)
History  This chart was scribed for Holly Lennert, MD, by Candelaria Stagers, ED Scribe. This patient was seen in room APA11/APA11 and the patient's care was started at 3:10 PM  CSN: [id number] Arrival date & time 01/17/13  1247  First MD Initiated Contact with Patient 01/17/13 1508     Chief Complaint  Patient presents with  . Headache  . Abdominal Pain    Patient is a 27 y.o. female presenting with abdominal pain. The history is provided by the patient. No language interpreter was used.  Abdominal Pain This is a new problem. The current episode started more than 2 days ago. The problem occurs constantly. The problem has been gradually worsening. Associated symptoms include abdominal pain. Exacerbated by: lying flat. Nothing relieves the symptoms. She has tried nothing for the symptoms. The treatment provided no relief.   HPI Comments: Holly Hodge is a 27 y.o. female who presents to the Emergency Department complaining of upper abdominal pain that started four days ago and has gradually worsened.  Pt denies nausea, vomiting, diarrhea, fever, or chills.  Pt states that lying flat makes the pain worse.  Pt has h/o cholecystectomy.  Nothing seems to make the sx better or worse.   Past Medical History  Diagnosis Date  . Anxiety   . 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/11/2012  . PE (pulmonary embolism)    Past Surgical History  Procedure Laterality Date  . Cesarean section      x2  . Cholecystectomy  2005   Family History  Problem Relation Age of Onset  . Hypertension Mother   . Hypertension Father   . Stroke Father   . Cancer Other    History  Substance Use Topics  . Smoking status: Current Every Day Smoker -- 0.33 packs/day for 6 years    Types: Cigarettes  . Smokeless tobacco: Never Used  . Alcohol Use: Yes     Comment: occasionally-holidays   OB History   Grav Para Term Preterm Abortions TAB SAB Ect Mult Living   2 2 2       2      Review of Systems  Gastrointestinal:  Positive for abdominal pain.  All other systems reviewed and are negative.    Allergies  Review of patient's allergies indicates no known allergies.  Home Medications   Current Outpatient Rx  Name  Route  Sig  Dispense  Refill  . ibuprofen (ADVIL,MOTRIN) 200 MG tablet   Oral   Take 800 mg by mouth every 6 (six) hours as needed for pain.         . Multiple Vitamin (MULTIVITAMIN WITH MINERALS) TABS   Oral   Take 1 tablet by mouth daily.          BP 106/73  Pulse 58  Temp(Src) 98.4 F (36.9 C) (Oral)  Resp 18  Ht 5\' 4"  (1.626 m)  Wt 215 lb (97.523 kg)  BMI 36.89 kg/m2  SpO2 100%  LMP 01/13/2013 Physical Exam  Nursing note and vitals reviewed. Constitutional: She is oriented to person, place, and time. She appears well-developed and well-nourished. No distress.  HENT:  Head: Normocephalic and atraumatic.  Eyes: EOM are normal.  Neck: Neck supple. No tracheal deviation present.  Cardiovascular: Normal rate.   Pulmonary/Chest: Effort normal. No respiratory distress.  Abdominal: There is tenderness (epigastric tenderness).  Musculoskeletal: Normal range of motion.  Neurological: She is alert and oriented to person, place, and time.  Skin: Skin is warm and dry.  Psychiatric: She has a normal mood and affect. Her behavior is normal.    ED Course  Procedures   DIAGNOSTIC STUDIES: Oxygen Saturation is 100% on room air, normal by my interpretation.    COORDINATION OF CARE:  3:13 PM Discussed course of care with pt which includes pain medication and basic blood work.  Pt understands and agrees.   4:28 PM Pt reports she is feeling somewhat better.  Discussed lab results with pt.  Advised pt to follow up with PCP if sx persist.    Labs Reviewed  CBC WITH DIFFERENTIAL - Abnormal; Notable for the following:    Lymphocytes Relative 48 (*)    All other components within normal limits  BASIC METABOLIC PANEL - Abnormal; Notable for the following:    GFR calc non Af Amer  76 (*)    GFR calc Af Amer 89 (*)    All other components within normal limits  URINALYSIS, ROUTINE W REFLEX MICROSCOPIC - Abnormal; Notable for the following:    Specific Gravity, Urine >1.030 (*)    Hgb urine dipstick LARGE (*)    All other components within normal limits  D-DIMER, QUANTITATIVE - Abnormal; Notable for the following:    D-Dimer, Quant 2.29 (*)    All other components within normal limits  HEPATIC FUNCTION PANEL - Abnormal; Notable for the following:    Albumin 3.3 (*)    All other components within normal limits  URINE MICROSCOPIC-ADD ON - Abnormal; Notable for the following:    Squamous Epithelial / LPF MANY (*)    Bacteria, UA FEW (*)    All other components within normal limits  PREGNANCY, URINE   No results found. No diagnosis found.  MDM   The chart was scribed for me under my direct supervision.  I personally performed the history, physical, and medical decision making and all procedures in the evaluation of this patient.Holly Lennert, MD 01/17/13 [phone]

## 2013-01-17 NOTE — ED Notes (Signed)
Pt with upper abd pain since Sat, worse after eating, also with hx of PE- "sort of short of breath"

## 2013-01-21 ENCOUNTER — Emergency Department (HOSPITAL_COMMUNITY)
Admission: EM | Admit: 2013-01-21 | Discharge: 2013-01-21 | Disposition: A | Discharge status: Home / Self Care | Payer: Self-pay

## 2013-03-02 ENCOUNTER — Encounter (HOSPITAL_COMMUNITY): Payer: Self-pay | Admitting: Emergency Medicine

## 2013-03-02 ENCOUNTER — Emergency Department (HOSPITAL_COMMUNITY)
Admission: EM | Admit: 2013-03-02 | Discharge: 2013-03-02 | Disposition: A | Discharge status: Home / Self Care | Payer: Medicaid Other | Attending: Emergency Medicine | Admitting: Emergency Medicine

## 2013-03-02 DIAGNOSIS — Z86711 Personal history of pulmonary embolism: Secondary | ICD-10-CM | POA: Insufficient documentation

## 2013-03-02 DIAGNOSIS — Z8659 Personal history of other mental and behavioral disorders: Secondary | ICD-10-CM | POA: Insufficient documentation

## 2013-03-02 DIAGNOSIS — K047 Periapical abscess without sinus: Secondary | ICD-10-CM | POA: Insufficient documentation

## 2013-03-02 DIAGNOSIS — F17200 Nicotine dependence, unspecified, uncomplicated: Secondary | ICD-10-CM | POA: Insufficient documentation

## 2013-03-02 DIAGNOSIS — Z79899 Other long term (current) drug therapy: Secondary | ICD-10-CM | POA: Insufficient documentation

## 2013-03-02 MED ORDER — OXYCODONE-ACETAMINOPHEN 5-325 MG PO TABS
1.0000 | ORAL_TABLET | Freq: Once | ORAL | Status: AC
  Administered 2013-03-02: 1 via ORAL
  Filled 2013-03-02: qty 1

## 2013-03-02 MED ORDER — IBUPROFEN 600 MG PO TABS: 600.0000 mg | ORAL_TABLET | Freq: Four times a day (QID) | ORAL | Status: DC | PRN

## 2013-03-02 MED ORDER — AMOXICILLIN 500 MG PO CAPS: 500.0000 mg | ORAL_CAPSULE | Freq: Three times a day (TID) | ORAL | Status: DC

## 2013-03-02 NOTE — ED Provider Notes (Signed)
CSN: [id number]     Arrival date & time 03/02/13  1044 History   First MD Initiated Contact with Patient 03/02/13 1125     Chief Complaint  Patient presents with  . Dental Pain   (Consider location/radiation/quality/duration/timing/severity/associated sxs/prior Treatment) Patient is a 27 y.o. female presenting with tooth pain. The history is provided by the patient.  Dental Pain Location:  Upper Upper teeth location:  16/LU 3rd molar Quality:  Throbbing Severity:  Moderate Onset quality:  Gradual Duration:  1 day Timing:  Constant Progression:  Worsening Chronicity:  New Context: abscess   Worsened by:  Nothing tried Ineffective treatments:  None tried Associated symptoms: no fever, no headaches and no neck pain    Holly Hodge is a 27 y.o. female who presents to the ED with dental pain that started this morning. The pain is in the left upper wisdom tooth.   Past Medical History  Diagnosis Date  . Anxiety   . 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/11/2012  . PE (pulmonary embolism)    Past Surgical History  Procedure Laterality Date  . Cesarean section      x2  . Cholecystectomy  2005   Family History  Problem Relation Age of Onset  . Hypertension Mother   . Hypertension Father   . Stroke Father   . Cancer Other    History  Substance Use Topics  . Smoking status: Current Every Day Smoker -- 0.33 packs/day for 6 years    Types: Cigarettes  . Smokeless tobacco: Never Used  . Alcohol Use: Yes     Comment: occasionally-holidays   OB History   Grav Para Term Preterm Abortions TAB SAB Ect Mult Living   2 2 2       2      Review of Systems  Constitutional: Negative for fever and chills.  HENT: Positive for dental problem. Negative for neck pain.   Respiratory: Negative for shortness of breath.   Gastrointestinal: Negative for nausea and vomiting.  Skin: Negative for rash.  Neurological: Negative for headaches.  Psychiatric/Behavioral: The patient is not  nervous/anxious.     Allergies  Review of patient's allergies indicates no known allergies.  Home Medications   Current Outpatient Rx  Name  Route  Sig  Dispense  Refill  . ibuprofen (ADVIL,MOTRIN) 200 MG tablet   Oral   Take 800 mg by mouth every 6 (six) hours as needed for pain.         . Multiple Vitamin (MULTIVITAMIN WITH MINERALS) TABS   Oral   Take 1 tablet by mouth daily.         . promethazine (PHENERGAN) 25 MG tablet   Oral   Take 1 tablet (25 mg total) by mouth every 6 (six) hours as needed for nausea.   20 tablet   0    BP 135/69  Pulse 95  Temp(Src) 99.5 F (37.5 C) (Oral)  Resp 20  SpO2 99% Physical Exam  Nursing note and vitals reviewed. Constitutional: She is oriented to person, place, and time. She appears well-developed and well-nourished. No distress.  HENT:  Head: Normocephalic.  Mouth/Throat: Uvula is midline, oropharynx is clear and moist and mucous membranes are normal.    Tender with exam left upper third molar. Tenderness of gum surrounding the tooth.  Eyes: EOM are normal.  Neck: Normal range of motion. Neck supple.  Cardiovascular: Normal rate, regular rhythm and normal heart sounds.   Pulmonary/Chest: Effort normal and breath sounds normal.  Musculoskeletal:  Normal range of motion.  Lymphadenopathy:    She has no cervical adenopathy.  Neurological: She is alert and oriented to person, place, and time. No cranial nerve deficit.  Skin: Skin is warm and dry.  Psychiatric: She has a normal mood and affect. Her behavior is normal.    ED Course  Procedures  MDM  27 y.o. female with dental pain. Will treat for inflammation and infection. Patient to follow up with her dentist as soon as possible.     Holly Hodge, [place] 03/03/13 1456

## 2013-03-02 NOTE — ED Notes (Signed)
Pt c/o left side dental pain that began this morning when she woke up.

## 2013-03-03 NOTE — ED Provider Notes (Signed)
Medical screening examination/treatment/procedure(s) were performed by non-physician practitioner and as supervising physician I was immediately available for consultation/collaboration.   William Zyair Rhein, MD 03/03/13 2050 

## 2013-04-21 ENCOUNTER — Encounter (INDEPENDENT_AMBULATORY_CARE_PROVIDER_SITE_OTHER): Payer: Self-pay

## 2013-04-21 ENCOUNTER — Encounter: Payer: Self-pay | Admitting: Obstetrics & Gynecology

## 2013-04-21 ENCOUNTER — Ambulatory Visit (INDEPENDENT_AMBULATORY_CARE_PROVIDER_SITE_OTHER): Payer: Medicaid Other | Admitting: Obstetrics & Gynecology

## 2013-04-21 VITALS — BP 108/56 | Ht 65.0 in | Wt 196.5 lb

## 2013-04-21 DIAGNOSIS — Z3202 Encounter for pregnancy test, result negative: Secondary | ICD-10-CM

## 2013-04-21 LAB — POCT URINE PREGNANCY: Preg Test, Ur: NEGATIVE

## 2013-04-21 NOTE — Progress Notes (Signed)
Patient ID: Holly Hodge, female   DOB: 1985-09-12, 27 y.o.   MRN: [id number] Pt here for urine pregnancy test resulted negative, LMP 03/02/2013, QHCG done today.

## 2013-06-10 ENCOUNTER — Emergency Department (HOSPITAL_COMMUNITY)
Admission: EM | Admit: 2013-06-10 | Discharge: 2013-06-10 | Disposition: A | Discharge status: Home / Self Care | Payer: Medicaid Other | Attending: Emergency Medicine | Admitting: Emergency Medicine

## 2013-06-10 ENCOUNTER — Encounter (HOSPITAL_COMMUNITY): Payer: Self-pay | Admitting: Emergency Medicine

## 2013-06-10 DIAGNOSIS — R1013 Epigastric pain: Secondary | ICD-10-CM

## 2013-06-10 DIAGNOSIS — R110 Nausea: Secondary | ICD-10-CM | POA: Insufficient documentation

## 2013-06-10 DIAGNOSIS — Z79899 Other long term (current) drug therapy: Secondary | ICD-10-CM | POA: Insufficient documentation

## 2013-06-10 DIAGNOSIS — R197 Diarrhea, unspecified: Secondary | ICD-10-CM | POA: Insufficient documentation

## 2013-06-10 DIAGNOSIS — Z3201 Encounter for pregnancy test, result positive: Secondary | ICD-10-CM | POA: Insufficient documentation

## 2013-06-10 DIAGNOSIS — Z8659 Personal history of other mental and behavioral disorders: Secondary | ICD-10-CM | POA: Insufficient documentation

## 2013-06-10 DIAGNOSIS — Z86711 Personal history of pulmonary embolism: Secondary | ICD-10-CM | POA: Insufficient documentation

## 2013-06-10 DIAGNOSIS — Z9089 Acquired absence of other organs: Secondary | ICD-10-CM | POA: Insufficient documentation

## 2013-06-10 DIAGNOSIS — Z3490 Encounter for supervision of normal pregnancy, unspecified, unspecified trimester: Secondary | ICD-10-CM

## 2013-06-10 DIAGNOSIS — K5900 Constipation, unspecified: Secondary | ICD-10-CM | POA: Insufficient documentation

## 2013-06-10 LAB — COMPREHENSIVE METABOLIC PANEL
ALT: 13 U/L (ref 0–35)
Albumin: 3.3 g/dL — ABNORMAL LOW (ref 3.5–5.2)
Alkaline Phosphatase: 56 U/L (ref 39–117)
CO2: 23 mEq/L (ref 19–32)
Calcium: 9 mg/dL (ref 8.4–10.5)
GFR calc Af Amer: >90 mL/min (ref >90)
GFR calc non Af Amer: >90 mL/min (ref >90)
Glucose, Bld: 81 mg/dL (ref 70–99)
Potassium: 3.8 mEq/L (ref 3.5–5.1)
Sodium: 135 mEq/L (ref 135–145)
Total Protein: 6.6 g/dL (ref 6.0–8.3)

## 2013-06-10 LAB — CBC WITH DIFFERENTIAL/PLATELET
Basophils Absolute: 0.1 10*3/uL (ref 0.0–0.1)
Basophils Relative: 1 % (ref 0–1)
Eosinophils Absolute: 0 10*3/uL (ref 0.0–0.7)
Eosinophils Relative: 1 % (ref 0–5)
Lymphs Abs: 2 10*3/uL (ref 0.7–4.0)
MCH: 31.1 pg (ref 26.0–34.0)
MCHC: 35 g/dL (ref 30.0–36.0)
MCV: 88.9 fL (ref 78.0–100.0)
Neutrophils Relative %: 35 % — ABNORMAL LOW (ref 43–77)
Platelets: 234 10*3/uL (ref 150–400)
RBC: 4.05 MIL/uL (ref 3.87–5.11)
RDW: 12.8 % (ref 11.5–15.5)

## 2013-06-10 LAB — LIPASE, BLOOD: Lipase: 20 U/L (ref 11–59)

## 2013-06-10 LAB — URINALYSIS, ROUTINE W REFLEX MICROSCOPIC
Bilirubin Urine: NEGATIVE
Glucose, UA: NEGATIVE mg/dL
Hgb urine dipstick: NEGATIVE
Ketones, ur: NEGATIVE mg/dL
Leukocytes, UA: NEGATIVE
Nitrite: NEGATIVE
Specific Gravity, Urine: >1.03 — ABNORMAL HIGH (ref 1.005–1.030)
pH: 6 (ref 5.0–8.0)

## 2013-06-10 LAB — HCG, QUANTITATIVE, PREGNANCY: hCG, Beta Chain, Quant, S: 34583 m[IU]/mL — ABNORMAL HIGH (ref <5)

## 2013-06-10 MED ORDER — ONDANSETRON 8 MG PO TBDP: ORAL_TABLET | ORAL | Status: DC

## 2013-06-10 MED ORDER — GI COCKTAIL ~~LOC~~
30.0000 mL | Freq: Once | ORAL | Status: AC
  Administered 2013-06-10: 30 mL via ORAL
  Filled 2013-06-10: qty 30

## 2013-06-10 NOTE — ED Provider Notes (Signed)
CSN: [id number]     Arrival date & time 06/10/13  1104 History  This chart was scribed for Geoffery Lyons, MD by Bennett Scrape, ED Scribe. This patient was seen in room APA03/APA03 and the patient's care was started at 11:35 AM.    Chief Complaint  Patient presents with  . Abdominal Pain  . Nausea  . Constipation    The history is provided by the patient. No language interpreter was used.    HPI Comments: Holly Hodge is a 27 y.o. female who presents to the Emergency Department complaining of epigastric abdominal pain described as pressure with associated nausea that started 2 days ago. Pt states that she recently started a new job and worked 16 hours making the symptoms worse. She reports one episode of diarrhea with the onset but now states that she feels constipated. She reports that she will have the urge to have a BM, and will pass a small amount of stool but the urge will remain. She denies emesis, melena, hematochezia or urinary symptoms. She has a h/o cholecystectomy in 2005 and 2 prior c-sections. She denies any complications from her prior surgeries. LNMP was in October 2014 but she reports that her menses are at baseline irregular. She admits that there is a chance of pregnancy.    Past Medical History  Diagnosis Date  . Anxiety   . 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/11/2012  . PE (pulmonary embolism)    Past Surgical History  Procedure Laterality Date  . Cesarean section      x2  . Cholecystectomy  2005   Family History  Problem Relation Age of Onset  . Hypertension Mother   . Hypertension Father   . Stroke Father   . Cancer Other    History  Substance Use Topics  . Smoking status: Current Every Day Smoker -- 0.33 packs/day for 6 years    Types: Cigarettes  . Smokeless tobacco: Never Used  . Alcohol Use: Yes     Comment: occasionally-holidays   OB History   Grav Para Term Preterm Abortions TAB SAB Ect Mult Living   2 2 2       2      Review of Systems   Gastrointestinal: Positive for nausea, abdominal pain, diarrhea (once) and constipation. Negative for vomiting and blood in stool.  Genitourinary: Negative for dysuria and frequency.  All other systems reviewed and are negative.    Allergies  Review of patient's allergies indicates no known allergies.  Home Medications   Current Outpatient Rx  Name  Route  Sig  Dispense  Refill  . ibuprofen (ADVIL,MOTRIN) 200 MG tablet   Oral   Take 800 mg by mouth every 6 (six) hours as needed for mild pain.         . Multiple Vitamin (MULTIVITAMIN WITH MINERALS) TABS   Oral   Take 1 tablet by mouth daily.          Triage Vitals: BP 108/55  Pulse 71  Temp(Src) 98.4 F (36.9 C) (Oral)  Resp 20  Ht 5\' 5"  (1.651 m)  Wt 195 lb (88.451 kg)  BMI 32.45 kg/m2  SpO2 100%  LMP 04/23/2013  Physical Exam  Nursing note and vitals reviewed. Constitutional: She is oriented to person, place, and time. She appears well-developed and well-nourished. No distress.  HENT:  Head: Normocephalic and atraumatic.  Eyes: Conjunctivae and EOM are normal.  Neck: Normal range of motion. Neck supple. No tracheal deviation present.  Cardiovascular: Normal rate, regular  rhythm and normal heart sounds.   No murmur heard. Pulmonary/Chest: Effort normal and breath sounds normal. No respiratory distress. She has no wheezes. She has no rales.  Abdominal: Soft. Bowel sounds are normal. There is no tenderness.  Musculoskeletal: Normal range of motion. She exhibits no edema.  Neurological: She is alert and oriented to person, place, and time. No cranial nerve deficit.  Skin: Skin is warm and dry.  Psychiatric: She has a normal mood and affect. Her behavior is normal.    ED Course  Procedures (including critical care time)  Medications  gi cocktail (Maalox,Lidocaine,Donnatal) (30 mLs Oral Given 06/10/13 1154)    DIAGNOSTIC STUDIES: Oxygen Saturation is 100% on room air, normal by my interpretation.     COORDINATION OF CARE: 11:41 AM-Discussed treatment plan which includes GI cocktail, CBC, CMP and UA with pt at bedside and pt agreed to plan.   12:38 PM- Pt rechecked and is feeling improved. Informed pt that her pregnancy test is positive. Advised that I am still awaiting the results of other testing. Will most likely discharge home with antiemetic with advise to f/u with OB-GYN.  Labs Review Labs Reviewed  CBC WITH DIFFERENTIAL - Abnormal; Notable for the following:    WBC 3.8 (*)    Neutrophils Relative % 35 (*)    Neutro Abs 1.4 (*)    Lymphocytes Relative 53 (*)    All other components within normal limits  COMPREHENSIVE METABOLIC PANEL - Abnormal; Notable for the following:    Albumin 3.3 (*)    All other components within normal limits  URINALYSIS, ROUTINE W REFLEX MICROSCOPIC - Abnormal; Notable for the following:    Specific Gravity, Urine >1.030 (*)    All other components within normal limits  PREGNANCY, URINE - Abnormal; Notable for the following:    Preg Test, Ur POSITIVE (*)    All other components within normal limits  HCG, QUANTITATIVE, PREGNANCY  LIPASE, BLOOD   Imaging Review No results found.    MDM  No diagnosis found. Patient is a 27 year old female who presents with complaints of upper abdominal pain. She feels nauseated but has not vomited. Workup reveals a positive pregnancy test with a quantitative beta of 34,000. She states she missed a period last month but did not know up to this point that she was pregnant. Remainder the workup reveals no elevation of white count, liver functions, lipase or evidence for urinary tract infection. She was given a GI cocktail with some relief. Her discomfort is epigastric and there is no lower abdominal tenderness or discomfort. She is not spotting or bleeding and I doubt a threatened abortion or ectopic pregnancy. However as this is the first pregnancy that she has had, I will arrange a ultrasound for the morning as they  were gone for the day. I will recommend she follow up with her GYN the next week.   I personally performed the services described in this documentation, which was scribed in my presence. The recorded information has been reviewed and is accurate.      Geoffery Lyons, MD 06/10/13 [phone]

## 2013-06-10 NOTE — ED Notes (Signed)
Pt c/o abd pain, nausea, constipation, states that she will have bowel movement but still feel like she has to have another bowel movement, denies any fever, vomiting.

## 2013-06-11 ENCOUNTER — Other Ambulatory Visit (HOSPITAL_COMMUNITY): Payer: Medicaid Other

## 2013-06-13 ENCOUNTER — Other Ambulatory Visit: Payer: Self-pay | Admitting: Obstetrics & Gynecology

## 2013-06-13 DIAGNOSIS — O3680X0 Pregnancy with inconclusive fetal viability, not applicable or unspecified: Secondary | ICD-10-CM

## 2013-06-14 ENCOUNTER — Other Ambulatory Visit: Payer: Self-pay | Admitting: Obstetrics & Gynecology

## 2013-06-14 ENCOUNTER — Ambulatory Visit (INDEPENDENT_AMBULATORY_CARE_PROVIDER_SITE_OTHER): Payer: Medicaid Other

## 2013-06-14 DIAGNOSIS — O26849 Uterine size-date discrepancy, unspecified trimester: Secondary | ICD-10-CM

## 2013-06-14 DIAGNOSIS — O3680X0 Pregnancy with inconclusive fetal viability, not applicable or unspecified: Secondary | ICD-10-CM

## 2013-06-14 NOTE — Progress Notes (Signed)
U/S-transvaginal u/s performed, retroverted uterus noted, single IUP with +FCA noted, FHR-126 bpm, cx long and closed,bilateral adnexa WNL, CRL c/w 6+3wks EDD 02/04/2014

## 2013-06-16 ENCOUNTER — Other Ambulatory Visit: Payer: Self-pay | Admitting: Obstetrics & Gynecology

## 2013-06-16 ENCOUNTER — Ambulatory Visit (INDEPENDENT_AMBULATORY_CARE_PROVIDER_SITE_OTHER): Payer: Medicaid Other

## 2013-06-16 DIAGNOSIS — O200 Threatened abortion: Secondary | ICD-10-CM
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
## 2013-06-20 ENCOUNTER — Encounter: Payer: Medicaid Other | Admitting: Women's Health

## 2013-07-01 ENCOUNTER — Encounter (HOSPITAL_COMMUNITY): Payer: Self-pay | Admitting: Emergency Medicine

## 2013-07-01 ENCOUNTER — Emergency Department (HOSPITAL_COMMUNITY)
Admission: EM | Admit: 2013-07-01 | Discharge: 2013-07-01 | Disposition: A | Discharge status: Home / Self Care | Payer: Medicaid Other | Attending: Emergency Medicine | Admitting: Emergency Medicine

## 2013-07-01 DIAGNOSIS — O99330 Smoking (tobacco) complicating pregnancy, unspecified trimester: Secondary | ICD-10-CM | POA: Insufficient documentation

## 2013-07-01 DIAGNOSIS — O210 Mild hyperemesis gravidarum: Secondary | ICD-10-CM | POA: Insufficient documentation

## 2013-07-01 DIAGNOSIS — R112 Nausea with vomiting, unspecified: Secondary | ICD-10-CM

## 2013-07-01 DIAGNOSIS — O9989 Other specified diseases and conditions complicating pregnancy, childbirth and the puerperium: Secondary | ICD-10-CM | POA: Insufficient documentation

## 2013-07-01 DIAGNOSIS — Z86711 Personal history of pulmonary embolism: Secondary | ICD-10-CM | POA: Insufficient documentation

## 2013-07-01 DIAGNOSIS — R51 Headache: Secondary | ICD-10-CM | POA: Insufficient documentation

## 2013-07-01 DIAGNOSIS — Z8659 Personal history of other mental and behavioral disorders: Secondary | ICD-10-CM | POA: Insufficient documentation

## 2013-07-01 DIAGNOSIS — R5381 Other malaise: Secondary | ICD-10-CM | POA: Insufficient documentation

## 2013-07-01 DIAGNOSIS — M542 Cervicalgia: Secondary | ICD-10-CM | POA: Insufficient documentation

## 2013-07-01 MED ORDER — ONDANSETRON 4 MG PO TBDP
4.0000 mg | ORAL_TABLET | Freq: Once | ORAL | Status: AC
  Administered 2013-07-01: 4 mg via ORAL
  Filled 2013-07-01: qty 1

## 2013-07-01 MED ORDER — ONDANSETRON HCL 4 MG PO TABS: 4.0000 mg | ORAL_TABLET | Freq: Four times a day (QID) | ORAL | Status: DC

## 2013-07-01 NOTE — ED Notes (Signed)
Patient with no complaints at this time. Respirations even and unlabored. Skin warm/dry. Discharge instructions reviewed with patient at this time. Patient given opportunity to voice concerns/ask questions. Patient discharged at this time and left Emergency Department with steady gait.   

## 2013-07-01 NOTE — ED Notes (Signed)
Pt c/o nausea, vomiting, and headache since 7am.  Vomited x 2.

## 2013-07-01 NOTE — ED Provider Notes (Signed)
CSN: [id number]     Arrival date & time 07/01/13  1223 History  This chart was scribed for Raeford Razor, MD by Smiley Houseman, ED Scribe. The patient was seen in room APA06/APA06. Patient's care was started at 1:49 PM.     Chief Complaint  Patient presents with  . Headache  . Emesis    The history is provided by the patient. No language interpreter was used.   HPI Comments: Holly Hodge is a 27 y.o. female who is about 2 months pregnant presents to the Emergency Department complaining of constant headache with associated generalized weakness, vomiting, and neck "stiffness".  Pt states she was working last night when she began to feel weak.  She ate a sausage biscuit this morning, but later vomited.  She also had a constant moderate headache that has resolved since she was placed in a dark room.  She denies abdominal pain, fever, and chills.  She reports she visited Dr. Emelda Fear where he completed an ultrasound and it was normal.  She denies any similar symptoms in previous pregnancies.  Pt was on blood thinners, but hasn't taken them in about a year.       Past Medical History  Diagnosis Date  . Anxiety   . 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/11/2012  . PE (pulmonary embolism)    Past Surgical History  Procedure Laterality Date  . Cesarean section      x2  . Cholecystectomy  2005   Family History  Problem Relation Age of Onset  . Hypertension Mother   . Hypertension Father   . Stroke Father   . Cancer Other    History  Substance Use Topics  . Smoking status: Current Every Day Smoker -- 0.33 packs/day for 6 years    Types: Cigarettes  . Smokeless tobacco: Never Used  . Alcohol Use: Yes     Comment: occasionally-holidays   OB History   Grav Para Term Preterm Abortions TAB SAB Ect Mult Living   3 2 2       2      Review of Systems  All other systems reviewed and are negative.    Allergies  Review of patient's allergies indicates no known allergies.  Home Medications    Current Outpatient Rx  Name  Route  Sig  Dispense  Refill  . acetaminophen (TYLENOL) 325 MG tablet   Oral   Take 650 mg by mouth every 6 (six) hours as needed for headache.          Triage Vitals: BP 119/74  Pulse 96  Temp(Src) 98.9 F (37.2 C) (Oral)  Resp 18  Ht 5\' 5"  (1.651 m)  Wt 197 lb (89.359 kg)  BMI 32.78 kg/m2  SpO2 100%  LMP 04/23/2013 Physical Exam  Nursing note and vitals reviewed. Constitutional: She appears well-developed and well-nourished. No distress.  HENT:  Head: Normocephalic and atraumatic.  Eyes: Conjunctivae and EOM are normal. Pupils are equal, round, and reactive to light. Right eye exhibits no discharge. Left eye exhibits no discharge. No scleral icterus.  periorbital tissues normal in appearance.  Neck: Neck supple.  No nuchal rigidity.  Cardiovascular: Normal rate, regular rhythm and normal heart sounds.  Exam reveals no gallop and no friction rub.   No murmur heard. Pulmonary/Chest: Effort normal and breath sounds normal. No respiratory distress.  Abdominal: Soft. She exhibits no distension. There is no tenderness.  Musculoskeletal: She exhibits no edema and no tenderness.  Neurological: She is alert. No cranial nerve deficit.  Coordination normal.  Good finger to nose testing bilaterally   Skin: Skin is warm and dry.  Psychiatric: She has a normal mood and affect. Her behavior is normal. Thought content normal.    ED Course  Procedures (including critical care time) DIAGNOSTIC STUDIES: Oxygen Saturation is 100% on RA, normal by my interpretation.    COORDINATION OF CARE: 1:52 PM-Patient informed of current plan of treatment and evaluation and agrees with plan.     Labs Review Labs Reviewed - No data to display Imaging Review No results found.  EKG Interpretation   None       MDM   1. Nausea and vomiting    27 year old female with nausea and vomiting. Her abdominal exam is benign. Patient is hemodynamically stable. She  clinically appears well. Has a very low suspicion for significant metabolic arrangement, emergent surgical process or serious bacterial illness. This is more than likely a viral illness. Plan symptomatic treatment at this time. Return precautions were discussed. Outpatient followup otherwise.   I personally preformed the services scribed in my presence. The recorded information has been reviewed is accurate. Raeford Razor, MD.     Raeford Razor, MD 07/06/13 [phone]

## 2013-10-19 ENCOUNTER — Encounter (HOSPITAL_COMMUNITY): Payer: Self-pay | Admitting: Emergency Medicine

## 2013-10-19 ENCOUNTER — Emergency Department (HOSPITAL_COMMUNITY)
Admission: EM | Admit: 2013-10-19 | Discharge: 2013-10-20 | Disposition: A | Discharge status: Home / Self Care | Payer: Medicaid Other | Attending: Emergency Medicine | Admitting: Emergency Medicine

## 2013-10-19 DIAGNOSIS — Z8659 Personal history of other mental and behavioral disorders: Secondary | ICD-10-CM | POA: Insufficient documentation

## 2013-10-19 DIAGNOSIS — E669 Obesity, unspecified: Secondary | ICD-10-CM | POA: Insufficient documentation

## 2013-10-19 DIAGNOSIS — Y9389 Activity, other specified: Secondary | ICD-10-CM | POA: Insufficient documentation

## 2013-10-19 DIAGNOSIS — S0990XA Unspecified injury of head, initial encounter: Secondary | ICD-10-CM | POA: Insufficient documentation

## 2013-10-19 DIAGNOSIS — F17200 Nicotine dependence, unspecified, uncomplicated: Secondary | ICD-10-CM | POA: Insufficient documentation

## 2013-10-19 DIAGNOSIS — Y92410 Unspecified street and highway as the place of occurrence of the external cause: Secondary | ICD-10-CM | POA: Insufficient documentation

## 2013-10-19 DIAGNOSIS — Z86711 Personal history of pulmonary embolism: Secondary | ICD-10-CM | POA: Insufficient documentation
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
## 2013-10-20 NOTE — ED Provider Notes (Signed)
Medical screening examination/treatment/procedure(s) were performed by non-physician practitioner and as supervising physician I was immediately available for consultation/collaboration.    Sunnie NielsenBrian Juvenal Umar, MD 10/20/13 [phone]

## 2014-02-14 ENCOUNTER — Encounter (HOSPITAL_COMMUNITY): Payer: Self-pay | Admitting: Emergency Medicine

## 2014-02-14 ENCOUNTER — Inpatient Hospital Stay (HOSPITAL_COMMUNITY)
Admission: EM | Admit: 2014-02-14 | Discharge: 2014-02-16 | DRG: 765 | Disposition: A | Discharge status: Home / Self Care | Payer: Medicaid Other | Attending: Obstetrics & Gynecology | Admitting: Obstetrics & Gynecology

## 2014-02-14 ENCOUNTER — Inpatient Hospital Stay (HOSPITAL_COMMUNITY): Payer: Medicaid Other | Admitting: Anesthesiology

## 2014-02-14 ENCOUNTER — Encounter (HOSPITAL_COMMUNITY): Payer: Medicaid Other | Admitting: Anesthesiology

## 2014-02-14 ENCOUNTER — Inpatient Hospital Stay (HOSPITAL_COMMUNITY): Payer: Medicaid Other

## 2014-02-14 ENCOUNTER — Encounter (HOSPITAL_COMMUNITY)
Admission: EM | Disposition: A | Discharge status: Home / Self Care | Payer: Self-pay | Source: Home / Self Care | Attending: Obstetrics & Gynecology

## 2014-02-14 DIAGNOSIS — E669 Obesity, unspecified: Secondary | ICD-10-CM | POA: Diagnosis present

## 2014-02-14 DIAGNOSIS — O9912 Other diseases of the blood and blood-forming organs and certain disorders involving the immune mechanism complicating childbirth: Secondary | ICD-10-CM

## 2014-02-14 DIAGNOSIS — O4100X0 Oligohydramnios, unspecified trimester, not applicable or unspecified: Secondary | ICD-10-CM | POA: Diagnosis present

## 2014-02-14 DIAGNOSIS — O34219 Maternal care for unspecified type scar from previous cesarean delivery: Secondary | ICD-10-CM | POA: Diagnosis present

## 2014-02-14 DIAGNOSIS — R894 Abnormal immunological findings in specimens from other organs, systems and tissues: Secondary | ICD-10-CM | POA: Diagnosis present

## 2014-02-14 DIAGNOSIS — O99334 Smoking (tobacco) complicating childbirth: Secondary | ICD-10-CM | POA: Diagnosis present

## 2014-02-14 DIAGNOSIS — O99214 Obesity complicating childbirth: Secondary | ICD-10-CM

## 2014-02-14 DIAGNOSIS — D689 Coagulation defect, unspecified: Secondary | ICD-10-CM | POA: Diagnosis not present

## 2014-02-14 DIAGNOSIS — Z720 Tobacco use: Secondary | ICD-10-CM

## 2014-02-14 DIAGNOSIS — O480 Post-term pregnancy: Secondary | ICD-10-CM | POA: Diagnosis present

## 2014-02-14 DIAGNOSIS — Z6841 Body Mass Index (BMI) 40.0 and over, adult: Secondary | ICD-10-CM

## 2014-02-14 DIAGNOSIS — Z86711 Personal history of pulmonary embolism: Secondary | ICD-10-CM

## 2014-02-14 DIAGNOSIS — IMO0002 Reserved for concepts with insufficient information to code with codable children: Secondary | ICD-10-CM

## 2014-02-14 DIAGNOSIS — R760 Raised antibody titer: Secondary | ICD-10-CM

## 2014-02-14 DIAGNOSIS — Z823 Family history of stroke: Secondary | ICD-10-CM | POA: Diagnosis not present

## 2014-02-14 DIAGNOSIS — I2699 Other pulmonary embolism without acute cor pulmonale: Secondary | ICD-10-CM

## 2014-02-14 DIAGNOSIS — O289 Unspecified abnormal findings on antenatal screening of mother: Secondary | ICD-10-CM

## 2014-02-14 DIAGNOSIS — O0930 Supervision of pregnancy with insufficient antenatal care, unspecified trimester: Secondary | ICD-10-CM

## 2014-02-14 DIAGNOSIS — Z98891 History of uterine scar from previous surgery: Secondary | ICD-10-CM

## 2014-02-14 DIAGNOSIS — O99344 Other mental disorders complicating childbirth: Secondary | ICD-10-CM | POA: Diagnosis present

## 2014-02-14 DIAGNOSIS — F411 Generalized anxiety disorder: Secondary | ICD-10-CM | POA: Diagnosis present

## 2014-02-14 DIAGNOSIS — Z8249 Family history of ischemic heart disease and other diseases of the circulatory system: Secondary | ICD-10-CM | POA: Diagnosis not present

## 2014-02-14 LAB — COMPREHENSIVE METABOLIC PANEL
ALT: 10 U/L (ref 0–35)
AST: 14 U/L (ref 0–37)
Albumin: 2.8 g/dL — ABNORMAL LOW (ref 3.5–5.2)
Alkaline Phosphatase: 227 U/L — ABNORMAL HIGH (ref 39–117)
Anion gap: 12 (ref 5–15)
BILIRUBIN TOTAL: 0.2 mg/dL — AB (ref 0.3–1.2)
BUN: 7 mg/dL (ref 6–23)
CALCIUM: 9.1 mg/dL (ref 8.4–10.5)
CHLORIDE: 102 meq/L (ref 96–112)
CO2: 23 meq/L (ref 19–32)
CREATININE: 0.89 mg/dL (ref 0.50–1.10)
GFR, EST NON AFRICAN AMERICAN: 87 mL/min — AB (ref >90)
Glucose, Bld: 82 mg/dL (ref 70–99)
Potassium: 4.3 mEq/L (ref 3.7–5.3)
Sodium: 137 mEq/L (ref 137–147)
Total Protein: 6.9 g/dL (ref 6.0–8.3)

## 2014-02-14 LAB — CBC WITH DIFFERENTIAL/PLATELET
Basophils Absolute: 0 10*3/uL (ref 0.0–0.1)
Basophils Relative: 0 % (ref 0–1)
Eosinophils Absolute: 0.1 10*3/uL (ref 0.0–0.7)
Eosinophils Relative: 1 % (ref 0–5)
HEMATOCRIT: 38 % (ref 36.0–46.0)
Hemoglobin: 13 g/dL (ref 12.0–15.0)
LYMPHS ABS: 2.7 10*3/uL (ref 0.7–4.0)
LYMPHS PCT: 29 % (ref 12–46)
MCH: 31.4 pg (ref 26.0–34.0)
MCHC: 34.2 g/dL (ref 30.0–36.0)
MCV: 91.8 fL (ref 78.0–100.0)
MONO ABS: 0.6 10*3/uL (ref 0.1–1.0)
Monocytes Relative: 7 % (ref 3–12)
Neutro Abs: 5.7 10*3/uL (ref 1.7–7.7)
Neutrophils Relative %: 63 % (ref 43–77)
Platelets: 282 10*3/uL (ref 150–400)
RBC: 4.14 MIL/uL (ref 3.87–5.11)
RDW: 13.7 % (ref 11.5–15.5)
WBC: 9.1 10*3/uL (ref 4.0–10.5)

## 2014-02-14 LAB — TYPE AND SCREEN
ABO/RH(D): O POS
Antibody Screen: NEGATIVE

## 2014-02-14 LAB — CREATININE, SERUM
CREATININE: 0.76 mg/dL (ref 0.50–1.10)
GFR calc non Af Amer: >90 mL/min (ref >90)

## 2014-02-14 LAB — RAPID HIV SCREEN (WH-MAU): Rapid HIV Screen: NONREACTIVE

## 2014-02-14 LAB — RPR

## 2014-02-14 LAB — HEPATITIS B SURFACE ANTIGEN: HEP B S AG: NEGATIVE

## 2014-02-14 LAB — ABO/RH: ABO/RH(D): O POS

## 2014-02-14 SURGERY — Surgical Case
Anesthesia: Spinal

## 2014-02-14 MED ORDER — NALOXONE HCL 0.4 MG/ML IJ SOLN: 0.4000 mg | INTRAMUSCULAR | Status: DC | PRN

## 2014-02-14 MED ORDER — ONDANSETRON HCL 4 MG/2ML IJ SOLN
4.0000 mg | Freq: Once | INTRAMUSCULAR | Status: AC
  Administered 2014-02-14: 4 mg via INTRAVENOUS
  Filled 2014-02-14: qty 2

## 2014-02-14 MED ORDER — METOCLOPRAMIDE HCL 5 MG/ML IJ SOLN: 10.0000 mg | Freq: Three times a day (TID) | INTRAMUSCULAR | Status: DC | PRN

## 2014-02-14 MED ORDER — OXYTOCIN 10 UNIT/ML IJ SOLN
40.0000 [IU] | INTRAVENOUS | Status: DC | PRN
  Administered 2014-02-14: 40 [IU] via INTRAVENOUS

## 2014-02-14 MED ORDER — PRENATAL MULTIVITAMIN CH
1.0000 | ORAL_TABLET | Freq: Every day | ORAL | Status: DC
  Administered 2014-02-15 – 2014-02-16 (×2): 1 via ORAL
  Filled 2014-02-14 (×2): qty 1

## 2014-02-14 MED ORDER — SIMETHICONE 80 MG PO CHEW
80.0000 mg | CHEWABLE_TABLET | Freq: Three times a day (TID) | ORAL | Status: DC
  Administered 2014-02-15 – 2014-02-16 (×2): 80 mg via ORAL
  Filled 2014-02-14 (×2): qty 1

## 2014-02-14 MED ORDER — ENOXAPARIN SODIUM 40 MG/0.4ML ~~LOC~~ SOLN
40.0000 mg | SUBCUTANEOUS | Status: DC
  Administered 2014-02-14 – 2014-02-15 (×2): 40 mg via SUBCUTANEOUS
  Filled 2014-02-14 (×3): qty 0.4

## 2014-02-14 MED ORDER — ZOLPIDEM TARTRATE 5 MG PO TABS: 5.0000 mg | ORAL_TABLET | Freq: Every evening | ORAL | Status: DC | PRN

## 2014-02-14 MED ORDER — DIBUCAINE 1 % RE OINT: 1.0000 "application " | TOPICAL_OINTMENT | RECTAL | Status: DC | PRN

## 2014-02-14 MED ORDER — LACTATED RINGERS IV SOLN
INTRAVENOUS | Status: DC
  Administered 2014-02-14 (×5): via INTRAVENOUS

## 2014-02-14 MED ORDER — OXYTOCIN 40 UNITS IN LACTATED RINGERS INFUSION - SIMPLE MED: 62.5000 mL/h | INTRAVENOUS | Status: AC

## 2014-02-14 MED ORDER — PHENYLEPHRINE 8 MG IN D5W 100 ML (0.08MG/ML) PREMIX OPTIME
INJECTION | INTRAVENOUS | Status: DC | PRN
  Administered 2014-02-14: 60 ug/min via INTRAVENOUS

## 2014-02-14 MED ORDER — TETANUS-DIPHTH-ACELL PERTUSSIS 5-2.5-18.5 LF-MCG/0.5 IM SUSP: 0.5000 mL | Freq: Once | INTRAMUSCULAR | Status: DC

## 2014-02-14 MED ORDER — LACTATED RINGERS IV SOLN: INTRAVENOUS | Status: DC

## 2014-02-14 MED ORDER — ONDANSETRON HCL 4 MG/2ML IJ SOLN: 4.0000 mg | INTRAMUSCULAR | Status: DC | PRN

## 2014-02-14 MED ORDER — NALBUPHINE HCL 10 MG/ML IJ SOLN
5.0000 mg | INTRAMUSCULAR | Status: DC | PRN
  Administered 2014-02-14: 10 mg via SUBCUTANEOUS

## 2014-02-14 MED ORDER — SODIUM CHLORIDE 0.9 % IJ SOLN: 3.0000 mL | INTRAMUSCULAR | Status: DC | PRN

## 2014-02-14 MED ORDER — ONDANSETRON HCL 4 MG/2ML IJ SOLN
INTRAMUSCULAR | Status: AC
  Filled 2014-02-14: qty 2

## 2014-02-14 MED ORDER — MEPERIDINE HCL 25 MG/ML IJ SOLN
INTRAMUSCULAR | Status: AC
  Filled 2014-02-14: qty 1

## 2014-02-14 MED ORDER — FENTANYL CITRATE 0.05 MG/ML IJ SOLN: 25.0000 ug | INTRAMUSCULAR | Status: DC | PRN

## 2014-02-14 MED ORDER — OXYCODONE-ACETAMINOPHEN 5-325 MG PO TABS
1.0000 | ORAL_TABLET | ORAL | Status: DC | PRN
  Administered 2014-02-15 – 2014-02-16 (×6): 1 via ORAL
  Filled 2014-02-14: qty 2
  Filled 2014-02-14 (×5): qty 1

## 2014-02-14 MED ORDER — MEPERIDINE HCL 25 MG/ML IJ SOLN: 6.2500 mg | INTRAMUSCULAR | Status: DC | PRN

## 2014-02-14 MED ORDER — SCOPOLAMINE 1 MG/3DAYS TD PT72
1.0000 | MEDICATED_PATCH | Freq: Once | TRANSDERMAL | Status: DC
  Administered 2014-02-14: 1.5 mg via TRANSDERMAL

## 2014-02-14 MED ORDER — MEPERIDINE HCL 25 MG/ML IJ SOLN
INTRAMUSCULAR | Status: DC | PRN
  Administered 2014-02-14: 25 mg via INTRAVENOUS

## 2014-02-14 MED ORDER — KETOROLAC TROMETHAMINE 30 MG/ML IJ SOLN
INTRAMUSCULAR | Status: AC
  Filled 2014-02-14: qty 1

## 2014-02-14 MED ORDER — DIPHENHYDRAMINE HCL 50 MG/ML IJ SOLN
25.0000 mg | INTRAMUSCULAR | Status: DC | PRN
Start: 2014-02-14 — End: 2014-02-16

## 2014-02-14 MED ORDER — LANOLIN HYDROUS EX OINT: 1.0000 "application " | TOPICAL_OINTMENT | CUTANEOUS | Status: DC | PRN

## 2014-02-14 MED ORDER — SCOPOLAMINE 1 MG/3DAYS TD PT72
MEDICATED_PATCH | TRANSDERMAL | Status: AC
  Filled 2014-02-14: qty 1

## 2014-02-14 MED ORDER — BUPIVACAINE IN DEXTROSE 0.75-8.25 % IT SOLN
INTRATHECAL | Status: DC | PRN
  Administered 2014-02-14: 1.7 mL via INTRATHECAL

## 2014-02-14 MED ORDER — DIPHENHYDRAMINE HCL 25 MG PO CAPS
25.0000 mg | ORAL_CAPSULE | ORAL | Status: DC | PRN
  Administered 2014-02-14 – 2014-02-15 (×3): 25 mg via ORAL

## 2014-02-14 MED ORDER — SENNOSIDES-DOCUSATE SODIUM 8.6-50 MG PO TABS
2.0000 | ORAL_TABLET | ORAL | Status: DC
  Administered 2014-02-14 – 2014-02-15 (×2): 2 via ORAL
  Filled 2014-02-14 (×2): qty 2

## 2014-02-14 MED ORDER — MORPHINE SULFATE 0.5 MG/ML IJ SOLN
INTRAMUSCULAR | Status: AC
  Filled 2014-02-14: qty 10

## 2014-02-14 MED ORDER — MORPHINE SULFATE (PF) 0.5 MG/ML IJ SOLN
INTRAMUSCULAR | Status: DC | PRN
  Administered 2014-02-14: .15 mg via INTRATHECAL

## 2014-02-14 MED ORDER — FENTANYL CITRATE 0.05 MG/ML IJ SOLN
INTRAMUSCULAR | Status: DC | PRN
  Administered 2014-02-14: 25 ug via INTRATHECAL

## 2014-02-14 MED ORDER — CEFAZOLIN SODIUM-DEXTROSE 2-3 GM-% IV SOLR
2.0000 g | INTRAVENOUS | Status: AC
  Administered 2014-02-14: 2 g via INTRAVENOUS
  Filled 2014-02-14 (×2): qty 50

## 2014-02-14 MED ORDER — OXYTOCIN 10 UNIT/ML IJ SOLN
INTRAMUSCULAR | Status: AC
  Filled 2014-02-14: qty 4

## 2014-02-14 MED ORDER — SIMETHICONE 80 MG PO CHEW
80.0000 mg | CHEWABLE_TABLET | ORAL | Status: DC
Start: 2014-02-15 — End: 2014-02-16
  Administered 2014-02-14 – 2014-02-15 (×2): 80 mg via ORAL
  Filled 2014-02-14 (×2): qty 1

## 2014-02-14 MED ORDER — LACTATED RINGERS IV BOLUS (SEPSIS)
1000.0000 mL | Freq: Once | INTRAVENOUS | Status: AC
  Administered 2014-02-14: 1000 mL via INTRAVENOUS

## 2014-02-14 MED ORDER — KETOROLAC TROMETHAMINE 30 MG/ML IJ SOLN
30.0000 mg | Freq: Four times a day (QID) | INTRAMUSCULAR | Status: AC | PRN
  Administered 2014-02-14: 30 mg via INTRAVENOUS
  Filled 2014-02-14: qty 1

## 2014-02-14 MED ORDER — FAMOTIDINE IN NACL 20-0.9 MG/50ML-% IV SOLN
20.0000 mg | Freq: Once | INTRAVENOUS | Status: AC
Start: 2014-02-14 — End: 2014-02-14
  Administered 2014-02-14: 20 mg via INTRAVENOUS
  Filled 2014-02-14: qty 50

## 2014-02-14 MED ORDER — ONDANSETRON HCL 4 MG/2ML IJ SOLN: 4.0000 mg | Freq: Three times a day (TID) | INTRAMUSCULAR | Status: DC | PRN

## 2014-02-14 MED ORDER — DIPHENHYDRAMINE HCL 25 MG PO CAPS
25.0000 mg | ORAL_CAPSULE | Freq: Four times a day (QID) | ORAL | Status: DC | PRN
  Filled 2014-02-14 (×3): qty 1

## 2014-02-14 MED ORDER — DIPHENHYDRAMINE HCL 50 MG/ML IJ SOLN: 12.5000 mg | INTRAMUSCULAR | Status: DC | PRN

## 2014-02-14 MED ORDER — ONDANSETRON HCL 4 MG PO TABS: 4.0000 mg | ORAL_TABLET | ORAL | Status: DC | PRN

## 2014-02-14 MED ORDER — FENTANYL CITRATE 0.05 MG/ML IJ SOLN
INTRAMUSCULAR | Status: AC
  Filled 2014-02-14: qty 2

## 2014-02-14 MED ORDER — SIMETHICONE 80 MG PO CHEW: 80.0000 mg | CHEWABLE_TABLET | ORAL | Status: DC | PRN

## 2014-02-14 MED ORDER — WITCH HAZEL-GLYCERIN EX PADS: 1.0000 "application " | MEDICATED_PAD | CUTANEOUS | Status: DC | PRN

## 2014-02-14 MED ORDER — PHENYLEPHRINE 8 MG IN D5W 100 ML (0.08MG/ML) PREMIX OPTIME
INJECTION | INTRAVENOUS | Status: AC
  Filled 2014-02-14: qty 100

## 2014-02-14 MED ORDER — NALBUPHINE HCL 10 MG/ML IJ SOLN: 5.0000 mg | INTRAMUSCULAR | Status: DC | PRN

## 2014-02-14 MED ORDER — IBUPROFEN 600 MG PO TABS
600.0000 mg | ORAL_TABLET | Freq: Four times a day (QID) | ORAL | Status: DC
  Administered 2014-02-14 – 2014-02-16 (×7): 600 mg via ORAL
  Filled 2014-02-14 (×7): qty 1

## 2014-02-14 MED ORDER — CITRIC ACID-SODIUM CITRATE 334-500 MG/5ML PO SOLN
30.0000 mL | Freq: Once | ORAL | Status: AC
Start: 2014-02-14 — End: 2014-02-14
  Administered 2014-02-14: 30 mL via ORAL
  Filled 2014-02-14: qty 15

## 2014-02-14 MED ORDER — NALBUPHINE HCL 10 MG/ML IJ SOLN
INTRAMUSCULAR | Status: AC
  Administered 2014-02-14: 10 mg via SUBCUTANEOUS
  Filled 2014-02-14: qty 1

## 2014-02-14 MED ORDER — MENTHOL 3 MG MT LOZG: 1.0000 | LOZENGE | OROMUCOSAL | Status: DC | PRN

## 2014-02-14 MED ORDER — KETOROLAC TROMETHAMINE 30 MG/ML IJ SOLN
30.0000 mg | Freq: Four times a day (QID) | INTRAMUSCULAR | Status: AC | PRN
  Administered 2014-02-14: 30 mg via INTRAMUSCULAR

## 2014-02-14 MED ORDER — ONDANSETRON HCL 4 MG/2ML IJ SOLN
INTRAMUSCULAR | Status: DC | PRN
  Administered 2014-02-14: 4 mg via INTRAVENOUS

## 2014-02-14 MED ORDER — NALOXONE HCL 1 MG/ML IJ SOLN
1.0000 ug/kg/h | INTRAVENOUS | Status: DC | PRN
  Filled 2014-02-14: qty 2

## 2014-02-14 MED ORDER — HYDROMORPHONE HCL PF 1 MG/ML IJ SOLN
1.0000 mg | INTRAMUSCULAR | Status: DC | PRN
  Administered 2014-02-14: 1 mg via INTRAVENOUS
  Filled 2014-02-14: qty 1

## 2014-02-14 SURGICAL SUPPLY — 29 items
BARRIER ADHS 3X4 INTERCEED (GAUZE/BANDAGES/DRESSINGS) IMPLANT
BLADE SURG 10 STRL SS (BLADE) ×6 IMPLANT
BRR ADH 4X3 ABS CNTRL BYND (GAUZE/BANDAGES/DRESSINGS)
CLAMP CORD UMBIL (MISCELLANEOUS) IMPLANT
CLOTH BEACON ORANGE TIMEOUT ST (SAFETY) ×3 IMPLANT
DRAPE LG THREE QUARTER DISP (DRAPES) IMPLANT
DRSG OPSITE POSTOP 4X10 (GAUZE/BANDAGES/DRESSINGS) ×3 IMPLANT
DURAPREP 26ML APPLICATOR (WOUND CARE) ×3 IMPLANT
ELECT REM PT RETURN 9FT ADLT (ELECTROSURGICAL) ×3
ELECTRODE REM PT RTRN 9FT ADLT (ELECTROSURGICAL) ×1 IMPLANT
EXTRACTOR VACUUM KIWI (MISCELLANEOUS) IMPLANT
GLOVE BIO SURGEON STRL SZ 6.5 (GLOVE) ×2 IMPLANT
GLOVE BIO SURGEONS STRL SZ 6.5 (GLOVE) ×1
GLOVE BIOGEL PI IND STRL 7.0 (GLOVE) ×1 IMPLANT
GLOVE BIOGEL PI INDICATOR 7.0 (GLOVE) ×2
GOWN STRL REUS W/TWL LRG LVL3 (GOWN DISPOSABLE) ×6 IMPLANT
KIT ABG SYR 3ML LUER SLIP (SYRINGE) IMPLANT
NDL HYPO 25X5/8 SAFETYGLIDE (NEEDLE) IMPLANT
NEEDLE HYPO 25X5/8 SAFETYGLIDE (NEEDLE) IMPLANT
NS IRRIG 1000ML POUR BTL (IV SOLUTION) ×3 IMPLANT
PACK C SECTION WH (CUSTOM PROCEDURE TRAY) ×3 IMPLANT
PAD OB MATERNITY 4.3X12.25 (PERSONAL CARE ITEMS) ×3 IMPLANT
SUT VIC AB 0 CT1 36 (SUTURE) ×18 IMPLANT
SUT VIC AB 2-0 CT1 27 (SUTURE) ×3
SUT VIC AB 2-0 CT1 TAPERPNT 27 (SUTURE) ×1 IMPLANT
SUT VIC AB 4-0 PS2 27 (SUTURE) ×3 IMPLANT
TOWEL OR 17X24 6PK STRL BLUE (TOWEL DISPOSABLE) ×3 IMPLANT
TRAY FOLEY CATH 14FR (SET/KITS/TRAYS/PACK) IMPLANT
WATER STERILE IRR 1000ML POUR (IV SOLUTION) ×3 IMPLANT

## 2014-02-14 NOTE — MAU Note (Signed)
Calls made to [facility], OR desk, Sports administratorBS circulator, house coverage and Baxter HireKristen in the nursery.  Baby is for adoption, and pt plans for no contact at this time. EDC adjusted by early US: pt is 41.3wks

## 2014-02-14 NOTE — ED Notes (Signed)
OB rapid response called and stated to turn pt. On side, place pt. On 10L of O2 and give bolus of LR. Dr. Judd Lienelo notified. Dr. Judd Lienelo spoke with OB rapid response.

## 2014-02-14 NOTE — ED Provider Notes (Signed)
CSN: [id number]     Arrival date & time 02/14/14  8469 History   First MD Initiated Contact with Patient 02/14/14 0525     Chief Complaint  Patient presents with  . Abdominal Cramping     (Consider location/radiation/quality/duration/timing/severity/associated sxs/prior Treatment) HPI Comments: Patient is a 28 year old female G3 P2002 at approximately [redacted] weeks gestation. She presents with complaints of cramping that has been occurring intermittently across the lower abdomen for the past several hours. She denies any rush of fluid or bleeding. Her prior 2 pregnancies were delivered by C-section do to narrow pelvic outlet. She consents fetal movement.  Patient is a 28 y.o. female presenting with cramps. The history is provided by the patient.  Abdominal Cramping Pain location:  Suprapubic Pain quality: cramping   Pain radiates to:  Does not radiate Pain severity:  Moderate Onset quality:  Sudden Duration:  6 hours Timing:  Intermittent Progression:  Worsening Chronicity:  New   Past Medical History  Diagnosis Date  . Anxiety   . 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/11/2012  . PE (pulmonary embolism)    Past Surgical History  Procedure Laterality Date  . Cesarean section      x2  . Cholecystectomy  2005   Family History  Problem Relation Age of Onset  . Hypertension Mother   . Hypertension Father   . Stroke Father   . Cancer Other    History  Substance Use Topics  . Smoking status: Current Every Day Smoker -- 0.50 packs/day for 6 years    Types: Cigarettes  . Smokeless tobacco: Never Used  . Alcohol Use: No     Comment: occasionally-holidays   OB History   Grav Para Term Preterm Abortions TAB SAB Ect Mult Living   3 2 2       2      Review of Systems  All other systems reviewed and are negative.     Allergies  Review of patient's allergies indicates no known allergies.  Home Medications   Prior to Admission medications   Medication Sig Start Date End Date  Taking? Authorizing Provider  acetaminophen (TYLENOL) 325 MG tablet Take 650 mg by mouth every 6 (six) hours as needed for headache.    Historical Provider, MD   LMP 04/23/2013 Physical Exam  Nursing note and vitals reviewed. Constitutional: She is oriented to person, place, and time. She appears well-developed and well-nourished. No distress.  HENT:  Head: Normocephalic and atraumatic.  Neck: Normal range of motion. Neck supple.  Cardiovascular: Normal rate and regular rhythm.  Exam reveals no gallop and no friction rub.   No murmur heard. Pulmonary/Chest: Effort normal and breath sounds normal. No respiratory distress. She has no wheezes.  Abdominal: Soft. Bowel sounds are normal. She exhibits no distension.  Fundal height is consistent with stated gestational age.  Musculoskeletal: Normal range of motion.  Neurological: She is alert and oriented to person, place, and time.  Skin: Skin is warm and dry. She is not diaphoretic.    ED Course  Procedures (including critical care time) Labs Review Labs Reviewed  URINALYSIS, ROUTINE W REFLEX MICROSCOPIC  CBC WITH DIFFERENTIAL  COMPREHENSIVE METABOLIC PANEL    Imaging Review No results found.   EKG Interpretation None      MDM   Final diagnoses:  None    Patient presents with complaints of abdominal cramping and is approximately [redacted] weeks pregnant. She was placed on the fetal monitor and was noted to have decreased variability. This was concerning  to the OB response team who is recommending the patient be evaluated by Dr. Emelda FearFerguson. He came to evaluate the patient and [facility] She will go there for further observation and workup.    Holly Lyonsouglas Demetris Meinhardt, MD 02/15/14 [phone]

## 2014-02-14 NOTE — ED Notes (Signed)
Spoke with Kennyth ArnoldStacy in Labor and Delivery to notify the OB Rapid Response that the pt was placed on the fetal monitor.

## 2014-02-14 NOTE — Anesthesia Preprocedure Evaluation (Signed)
Anesthesia Evaluation  Patient identified by MRN, date of birth, ID band Patient awake    Reviewed: Allergy & Precautions, H&P , NPO status , Patient's Chart, lab work & pertinent test results  Airway Mallampati: III TM Distance: >3 FB Neck ROM: Full    Dental no notable dental hx. (+) Teeth Intact   Pulmonary Current Smoker,  breath sounds clear to auscultation  Pulmonary exam normal       Cardiovascular Rhythm:Regular Rate:Normal  Hx/o PTE Lupus Anticoagulant   Neuro/Psych negative neurological ROS     GI/Hepatic Neg liver ROS, GERD-  Medicated and Controlled,  Endo/Other  Morbid obesity  Renal/GU negative Renal ROS  negative genitourinary   Musculoskeletal negative musculoskeletal ROS (+)   Abdominal Normal abdominal exam  (+) + obese,   Peds  Hematology SLE   Anesthesia Other Findings   Reproductive/Obstetrics (+) Pregnancy Previous C/Section x 2 No prenatal care                           Anesthesia Physical Anesthesia Plan  ASA: III and emergent  Anesthesia Plan: Spinal   Post-op Pain Management:    Induction:   Airway Management Planned: Natural Airway  Additional Equipment:   Intra-op Plan:   Post-operative Plan:   Informed Consent: I have reviewed the patients History and Physical, chart, labs and discussed the procedure including the risks, benefits and alternatives for the proposed anesthesia with the patient or authorized representative who has indicated his/her understanding and acceptance.     Plan Discussed with: Anesthesiologist, Surgeon and CRNA  Anesthesia Plan Comments:         Anesthesia Quick Evaluation

## 2014-02-14 NOTE — Transfer of Care (Signed)
Immediate Anesthesia Transfer of Care Note  Patient: Holly Hodge  Procedure(s) Performed: Procedure(s): CESAREAN SECTION (N/A)  Patient Location: PACU  Anesthesia Type:Spinal  Level of Consciousness: awake, alert , oriented and patient cooperative  Airway & Oxygen Therapy: Patient Spontanous Breathing  Post-op Assessment: Report given to PACU RN and Post -op Vital signs reviewed and stable  Post vital signs: Reviewed and stable  Complications: No apparent anesthesia complications

## 2014-02-14 NOTE — Progress Notes (Signed)
This note also relates to the following rows which could not be included: Pulse Rate - Cannot attach notes to unvalidated device data SpO2 - Cannot attach notes to unvalidated device data   Notified AP ED RN and MD need to change positions off her back on to her side, 500cc LR IV bolus, and start 10L NRB O2. Adjusting monitors will be needed once position changed. AP ED MD to call Dr.Eure/Ferguson or FP on call MD to assess further.

## 2014-02-14 NOTE — Anesthesia Procedure Notes (Signed)
Epidural Patient location during procedure: OB Start time: 02/14/2014 10:56 AM  Staffing Anesthesiologist: Quentin Shorey A.  Preanesthetic Checklist Completed: patient identified, site marked, surgical consent, pre-op evaluation, timeout performed, IV checked, risks and benefits discussed and monitors and equipment checked  Epidural Patient position: sitting Prep: site prepped and draped and DuraPrep Patient monitoring: continuous pulse ox and blood pressure Approach: midline Location: L3-L4 Injection technique: LOR air  Needle:  Needle type: Tuohy  Needle gauge: 17 G Needle length: 9 cm and 9 Needle insertion depth: 7 cm Catheter type: closed end flexible Catheter size: 19 Gauge Catheter at skin depth: 10 cm Test dose: negative  Assessment Sensory level: T4 Events: blood not aspirated, injection not painful, no injection resistance, negative IV test and no paresthesia  Additional Notes Patient tolerated procedure well. Adequate sensory level.

## 2014-02-14 NOTE — ED Notes (Addendum)
Pt. Placed on 10 L O2. Pt. Placed on side.

## 2014-02-14 NOTE — ED Notes (Signed)
Pt c/o sharp pain "coming and going" going across from side to side across the lower abdomen. Reports she has had 2 C-sections prior to this due to a narrow pelvic outlet. Pt's due date is 02/26/14. Reports that she can feel the baby moving and denies any bleeding or "rush of fluid"

## 2014-02-14 NOTE — MAU Note (Signed)
[facility].  Pt has been having U/C's since about 0500 this morning.  Denies vaginal bleeding or ROM.  Good fetal movement.  Pt currently on oxygen by NRB 10 lpm for transport.

## 2014-02-14 NOTE — MAU Provider Note (Signed)
History     CSN: [id number]  Arrival date and time: 02/14/14 4163   None     Chief Complaint  Patient presents with  . Abdominal Cramping   HPI  [name] is a 28 y.o. female G3P2002 at 63w3d[facility]. Originally the patient stated her due date was somewhere around August 20th making her 38 weeks. [place] the patient's EDD is August 2. Pt has very little prenatal care.   OB History   Grav Para Term Preterm Abortions TAB SAB Ect Mult Living   3 2 2       2       Past Medical History  Diagnosis Date  . Anxiety   . 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/11/2012  . PE (pulmonary embolism)     Past Surgical History  Procedure Laterality Date  . Cesarean section      x2  . Cholecystectomy  2005    Family History  Problem Relation Age of Onset  . Hypertension Mother   . Hypertension Father   . Stroke Father   . Cancer Other     History  Substance Use Topics  . Smoking status: Current Every Day Smoker -- 0.50 packs/day for 6 years    Types: Cigarettes  . Smokeless tobacco: Never Used  . Alcohol Use: No     Comment: occasionally-holidays    Allergies: No Known Allergies  Prescriptions prior to admission  Medication Sig Dispense Refill  . acetaminophen (TYLENOL) 325 MG tablet Take 650 mg by mouth every 6 (six) hours as needed for headache.       Results for orders placed during the hospital encounter of 02/14/14 (from the past 48 hour(s))  CBC WITH DIFFERENTIAL     Status: None   Collection Time    02/14/14  5:59 AM      Result Value Ref Range   WBC 9.1  4.0 - 10.5 K/uL   RBC 4.14  3.87 - 5.11 MIL/uL   Hemoglobin 13.0  12.0 - 15.0 g/dL   HCT 38.0  36.0 - 46.0 %   MCV 91.8  78.0 - 100.0 fL   MCH 31.4  26.0 - 34.0 pg   MCHC 34.2  30.0 - 36.0 g/dL   RDW 13.7  11.5 - 15.5 %   Platelets 282  150 - 400  K/uL   Neutrophils Relative % 63  43 - 77 %   Neutro Abs 5.7  1.7 - 7.7 K/uL   Lymphocytes Relative 29  12 - 46 %   Lymphs Abs 2.7  0.7 - 4.0 K/uL   Monocytes Relative 7  3 - 12 %   Monocytes Absolute 0.6  0.1 - 1.0 K/uL   Eosinophils Relative 1  0 - 5 %   Eosinophils Absolute 0.1  0.0 - 0.7 K/uL   Basophils Relative 0  0 - 1 %   Basophils Absolute 0.0  0.0 - 0.1 K/uL  COMPREHENSIVE METABOLIC PANEL     Status: Abnormal   Collection Time    02/14/14  5:59 AM      Result Value Ref Range   Sodium 137  137 - 147 mEq/L   Potassium 4.3  3.7 - 5.3 mEq/L   Chloride 102  96 - 112 mEq/L   CO2 23  19 - 32 mEq/L  Glucose, Bld 82  70 - 99 mg/dL   BUN 7  6 - 23 mg/dL   Creatinine, Ser 0.89  0.50 - 1.10 mg/dL   Calcium 9.1  8.4 - 10.5 mg/dL   Total Protein 6.9  6.0 - 8.3 g/dL   Albumin 2.8 (*) 3.5 - 5.2 g/dL   AST 14  0 - 37 U/L   ALT 10  0 - 35 U/L   Alkaline Phosphatase 227 (*) 39 - 117 U/L   Total Bilirubin 0.2 (*) 0.3 - 1.2 mg/dL   GFR calc non Af Amer 87 (*) >90 mL/min   GFR calc Af Amer >90  >90 mL/min   Comment: (NOTE)     The eGFR has been calculated using the CKD EPI equation.     This calculation has not been validated in all clinical situations.     eGFR's persistently <90 mL/min signify possible Chronic Kidney     Disease.   Anion gap 12  5 - 15    Review of Systems  Constitutional: Negative for fever and chills.  Gastrointestinal: Positive for nausea, vomiting and abdominal pain (+ contraction pain ).   Physical Exam   Blood pressure 131/74, pulse 76, temperature 98.5 F (36.9 C), temperature source Oral, resp. rate 16, last menstrual period 04/23/2013, SpO2 100.00%.  Physical Exam  Constitutional: She is oriented to person, place, and time. She appears well-developed and well-nourished.  Neck: Neck supple.  Respiratory: Effort normal.  GI: Soft. There is tenderness (Upper abdominal tenderness; pt having regular contractions ).  Neurological: She is alert and  oriented to person, place, and time.  Skin: Skin is warm. She is not diaphoretic.  Psychiatric: Her behavior is normal.   Fetal Tracing: Baseline: 125 bpm Variability: Moderate Accelerations: 10x10 Decelerations: No decels   MAU Course  Procedures None  MDM BPP; Oligohydramnios.  Pt had 2 prior cesarean births.  Dr. Roselie Awkward to bedside to discuss OR Type and screen Prepare for OR Last PO fluids/ food was yesterday evening.   Assessment and Plan   Assessment: Oligohydramnios Repeat Cesarean section per Dr. Roselie Awkward. The procedure and the risk of anesthesia, blood clot, transfusion, infection, visceral organ damage, pain were discussed and her questions were answered. NPO since yesterday.  Darrelyn Hillock Rasch, NP 02/14/2014, 8:54 AM    Woodroe Mode, MD

## 2014-02-14 NOTE — Op Note (Signed)
Cesarean Section Procedure Note   Holly Hodge  02/14/2014  Indications: Repeat, 2 previous cesarean sections, 41 weeks 3 days   Pre-operative Diagnosis: Repeat Ceserean Section; Low amniotic fluid, elective repeat .   Post-operative Diagnosis: Same   Surgeon: Surgeon(s) and Role:    * Adam PhenixJames G Ricco Dershem, MD - Primary   Assistants: none  Anesthesia: spinal   Procedure Details:  The patient was seen in the Holding Room. The risks, benefits, complications, treatment options, and expected outcomes were discussed with the patient. The patient concurred with the proposed plan, giving informed consent. identified as Holly Hodge and the procedure verified as C-Section Delivery. A Time Out was held and the above information confirmed.  After induction of anesthesia, the patient was draped and prepped in the usual sterile manner. A transverse was made and carried down through the subcutaneous tissue to the fascia. Fascial incision was made and extended transversely. The fascia was separated from the underlying rectus tissue superiorly and inferiorly. The peritoneum was identified and entered. Peritoneal incision was extended longitudinally. The utero-vesical peritoneal reflection was incised transversely and the bladder flap was bluntly freed from the lower uterine segment. A low transverse uterine incision was made. Delivered from cephalic presentation was a 3055 gram Female with moderate meconium with Apgar scores of 6 at one minute and 9 at five minutes. Cord ph was unable to obtain the umbilical cord was clamped and cut cord blood was obtained for evaluation. The placenta was removed Intact and appeared normal. The uterine outline, tubes and ovaries appeared normal}. The uterine incision was closed with running locked sutures of 0Vicryl.The incision was imbricated and hemostatic suture was placed at the left angle. Hemostasis was observed. Lavage was carried out until clear. The fascia was then  reapproximated with running sutures of 0Vicryl. The subcutianeous closure was performed using 2-0Vicryl. The skin was closed with 4-0Vicryl. Honeycomb dressing was applied.  Instrument, sponge, and needle counts were correct prior the abdominal closure and were correct at the conclusion of the case.    Findings:   Estimated Blood Loss: * No blood loss amount entered *   Total IV Fluids: 2000ml   Urine Output: 60CC OF clear urine  Specimens: placenta  Complications: no complications  Disposition: PACU - hemodynamically stable.   Maternal Condition: stable   Baby condition / location:  Nursery  Attending Attestation: I was present and scrubbed for the entire procedure.   Signed: Surgeon(s): Adam PhenixJames G Lockie Bothun, MD

## 2014-02-14 NOTE — H&P (Signed)
Woodroe Mode, MD Physician Signed Obstetrics MAU Provider Note Service date: 02/14/2014 8:54 AM     History      CSN: [id number]   Arrival date and time: 02/14/14 0519    None         Chief Complaint   Patient presents with   .  Abdominal Cramping    HPI   [name] is a 28 y.o. female G3P2002 at 46w3dwho [facility]. Originally the patient stated her due date was somewhere around August 20th making her 38 weeks. [place] the patient's EDD is August 2. Pt has very little prenatal care.     OB History     Grav  Para  Term  Preterm  Abortions  TAB  SAB  Ect  Mult  Living     3  2  2              2           Past Medical History   Diagnosis  Date   .  Anxiety     .  Lupus anticoagulant positive  03/11/2012   .  PE (pulmonary embolism)         Past Surgical History   Procedure  Laterality  Date   .  Cesarean section           x2   .  Cholecystectomy    2005       Family History   Problem  Relation  Age of Onset   .  Hypertension  Mother     .  Hypertension  Father     .  Stroke  Father     .  Cancer  Other         History   Substance Use Topics   .  Smoking status:  Current Every Day Smoker -- 0.50 packs/day for 6 years       Types:  Cigarettes   .  Smokeless tobacco:  Never Used   .  Alcohol Use:  No         Comment: occasionally-holidays      Allergies: No Known Allergies    Prescriptions prior to admission   Medication  Sig  Dispense  Refill   .  acetaminophen (TYLENOL) 325 MG tablet  Take 650 mg by mouth every 6 (six) hours as needed for headache.          Results for orders placed during the hospital encounter of 02/14/14 (from the past 48 hour(s))   CBC WITH DIFFERENTIAL     Status: None     Collection Time      02/14/14  5:59 AM       Result  Value  Ref Range     WBC  9.1   4.0 - 10.5  K/uL     RBC  4.14   3.87 - 5.11 MIL/uL     Hemoglobin  13.0   12.0 - 15.0 g/dL     HCT  38.0   36.0 - 46.0 %     MCV  91.8   78.0 - 100.0 fL     MCH  31.4   26.0 - 34.0 pg     MCHC  34.2   30.0 - 36.0 g/dL     RDW  13.7  11.5 - 15.5 %     Platelets  282   150 - 400 K/uL     Neutrophils Relative %  63   43 - 77 %     Neutro Abs  5.7   1.7 - 7.7 K/uL     Lymphocytes Relative  29   12 - 46 %     Lymphs Abs  2.7   0.7 - 4.0 K/uL     Monocytes Relative  7   3 - 12 %     Monocytes Absolute  0.6   0.1 - 1.0 K/uL     Eosinophils Relative  1   0 - 5 %     Eosinophils Absolute  0.1   0.0 - 0.7 K/uL     Basophils Relative  0   0 - 1 %     Basophils Absolute  0.0   0.0 - 0.1 K/uL   COMPREHENSIVE METABOLIC PANEL     Status: Abnormal     Collection Time      02/14/14  5:59 AM       Result  Value  Ref Range     Sodium  137   137 - 147 mEq/L     Potassium  4.3   3.7 - 5.3 mEq/L     Chloride  102   96 - 112 mEq/L     CO2  23   19 - 32 mEq/L     Glucose, Bld  82   70 - 99 mg/dL     BUN  7   6 - 23 mg/dL     Creatinine, Ser  0.89   0.50 - 1.10 mg/dL     Calcium  9.1   8.4 - 10.5 mg/dL     Total Protein  6.9   6.0 - 8.3 g/dL     Albumin  2.8 (*)  3.5 - 5.2 g/dL     AST  14   0 - 37 U/L     ALT  10   0 - 35 U/L     Alkaline Phosphatase  227 (*)  39 - 117 U/L     Total Bilirubin  0.2 (*)  0.3 - 1.2 mg/dL     GFR calc non Af Amer  87 (*)  >90 mL/min     GFR calc Af Amer  >90   >90 mL/min     Comment:  (NOTE)        The eGFR has been calculated using the CKD EPI equation.        This calculation has not been validated in all clinical situations.        eGFR's persistently <90 mL/min signify possible Chronic Kidney        Disease.     Anion gap  12   5 - 15      Review of Systems  Constitutional: Negative for fever and chills.  Gastrointestinal: Positive for nausea, vomiting and abdominal pain (+ contraction pain ).  Physical Exam      Blood pressure 131/74, pulse 76, temperature 98.5  F (36.9 C), temperature source Oral, resp. rate 16, last menstrual period 04/23/2013, SpO2 100.00%.   Physical Exam  Constitutional: She is oriented to person, place, and time. She appears well-developed and well-nourished.  Neck: Neck supple.  Respiratory: Effort normal.  GI: Soft. There is tenderness (Upper abdominal tenderness; pt having regular contractions ).  Neurological: She is alert and oriented to person, place, and  time.  Skin: Skin is warm. She is not diaphoretic.  Psychiatric: Her behavior is normal.  Fetal Tracing: Baseline: 125 bpm Variability: Moderate Accelerations: 10x10 Decelerations: No decels     MAU Course    Procedures None   MDM BPP; Oligohydramnios.   Pt had 2 prior cesarean births.   Dr. Roselie Awkward to bedside to discuss OR Type and screen Prepare for OR Last PO fluids/ food was yesterday evening.     Assessment and Plan      Assessment: Oligohydramnios Repeat Cesarean section per Dr. Roselie Awkward. The procedure and the risk of anesthesia, blood clot, transfusion, infection, visceral organ damage, pain were discussed and her questions were answered. NPO since yesterday.   Darrelyn Hillock Rasch, NP 02/14/2014, 8:54 AM      Woodroe Mode, MD

## 2014-02-14 NOTE — Anesthesia Postprocedure Evaluation (Signed)
Anesthesia Post Note  Patient: Holly Hodge  Procedure(s) Performed: Procedure(s) (LRB): CESAREAN SECTION (N/A)  Anesthesia type: Spinal  Patient location: PACU  Post pain: Pain level controlled  Post assessment: Post-op Vital signs reviewed  Last Vitals:  Filed Vitals:   02/14/14 1300  BP: 95/62  Pulse: 64  Temp:   Resp: 23    Post vital signs: Reviewed  Level of consciousness: awake  Complications: No apparent anesthesia complications

## 2014-02-14 NOTE — ED Notes (Signed)
Dr. Ferguson at bedside. 

## 2014-02-15 ENCOUNTER — Encounter (HOSPITAL_COMMUNITY): Payer: Self-pay | Admitting: Obstetrics & Gynecology

## 2014-02-15 LAB — CBC
HCT: 32.1 % — ABNORMAL LOW (ref 36.0–46.0)
Hemoglobin: 11 g/dL — ABNORMAL LOW (ref 12.0–15.0)
MCH: 31.6 pg (ref 26.0–34.0)
MCHC: 34.3 g/dL (ref 30.0–36.0)
MCV: 92.2 fL (ref 78.0–100.0)
PLATELETS: 253 10*3/uL (ref 150–400)
RBC: 3.48 MIL/uL — AB (ref 3.87–5.11)
RDW: 13.7 % (ref 11.5–15.5)
WBC: 13.8 10*3/uL — AB (ref 4.0–10.5)

## 2014-02-15 NOTE — Anesthesia Postprocedure Evaluation (Signed)
  Anesthesia Post-op Note  Patient: Holly Hodge  Procedure(s) Performed: Procedure(s): CESAREAN SECTION (N/A)  Patient Location: Women's Unit  Anesthesia Type:Spinal  Level of Consciousness: awake, alert  and oriented  Airway and Oxygen Therapy: Patient Spontanous Breathing and Patient connected to nasal cannula oxygen  Post-op Pain: none  Post-op Assessment: Post-op Vital signs reviewed, Patient's Cardiovascular Status Stable, Respiratory Function Stable and No signs of Nausea or vomiting  Post-op Vital Signs: Reviewed and stable  Last Vitals:  Filed Vitals:   02/15/14 0503  BP: 110/69  Pulse: 63  Temp: 36.9 C  Resp: 18    Complications: No apparent anesthesia complications

## 2014-02-15 NOTE — Addendum Note (Signed)
Addendum created 02/15/14 0749 by Shanon PayorSuzanne M Sanjit Mcmichael, CRNA   Modules edited: Notes Section   Notes Section:  File: [id number]

## 2014-02-15 NOTE — Progress Notes (Signed)
Subjective: Postpartum Day 1: Cesarean Delivery Patient reports incisional pain and tolerating PO.    Objective: Vital signs in last 24 hours: Temp:  [97.8 F (36.6 C)-98.6 F (37 C)] 98.5 F (36.9 C) (08/13 0503) Pulse Rate:  [60-88] 63 (08/13 0503) Resp:  [15-23] 18 (08/13 0503) BP: (86-130)/(54-95) 110/69 mmHg (08/13 0503) SpO2:  [97 %-100 %] 100 % (08/13 0503) Weight:  [240 lb 1.3 oz (108.9 kg)] 240 lb 1.3 oz (108.9 kg) (08/12 1510)  Physical Exam:  General: alert, cooperative and mild distress Lochia: appropriate Uterine Fundus: firm Incision: dressing dry DVT Evaluation: No evidence of DVT seen on physical exam.   Recent Labs  02/14/14 0559 02/15/14 0530  HGB 13.0 11.0*  HCT 38.0 32.1*    Assessment/Plan: Status post Cesarean section. Doing well postoperatively.  Continue current care.  Remingtyn Depaola H 02/15/2014, 7:48 AM

## 2014-02-15 NOTE — Progress Notes (Signed)
CSW completed psychosocial assessment. Full documentation of assessment to follow.  

## 2014-02-15 NOTE — Lactation Note (Signed)
This note was copied from the chart of Holly Hodge. Lactation Consultation Note  Patient Name: Holly Hodge [id number]'s Date: 02/15/2014 Reason for consult: Other (Comment) (formula exclusion)   Maternal Data Formula Feeding for Exclusion: Yes Reason for exclusion: Mother's choice to formula feed on admision;Adoption or foster home placement of newborn;Substance abuse and/or alcohol abuse (mom giving baby up for adoption)  Feeding Feeding Type: Bottle Fed - Formula Nipple Type: Regular  LATCH Score/Interventions                      Lactation Tools Discussed/Used     Consult Status Consult Status: Complete    Alfred LevinsLee, Mannix Kroeker Anne 02/15/2014, 10:54 AM

## 2014-02-15 NOTE — Progress Notes (Signed)
UR completed 

## 2014-02-15 NOTE — Progress Notes (Signed)
Relinquishment of Parental Rights and Physical Transfer of Infant to adoption agency signed by birth mother and placed in baby's chart.  [facility] once medically ready for discharge.

## 2014-02-16 LAB — URINE MICROSCOPIC-ADD ON

## 2014-02-16 LAB — URINALYSIS, ROUTINE W REFLEX MICROSCOPIC
Bilirubin Urine: NEGATIVE
GLUCOSE, UA: NEGATIVE mg/dL
Ketones, ur: NEGATIVE mg/dL
Leukocytes, UA: NEGATIVE
Nitrite: NEGATIVE
PH: 6 (ref 5.0–8.0)
PROTEIN: NEGATIVE mg/dL
SPECIFIC GRAVITY, URINE: 1.02 (ref 1.005–1.030)
Urobilinogen, UA: 0.2 mg/dL (ref 0.0–1.0)

## 2014-02-16 LAB — RAPID URINE DRUG SCREEN, HOSP PERFORMED
Amphetamines: NOT DETECTED
Barbiturates: NOT DETECTED
Benzodiazepines: NOT DETECTED
COCAINE: POSITIVE — AB
OPIATES: NOT DETECTED
Tetrahydrocannabinol: POSITIVE — AB

## 2014-02-16 MED ORDER — OXYCODONE-ACETAMINOPHEN 5-325 MG PO TABS: 1.0000 | ORAL_TABLET | ORAL | Status: DC | PRN

## 2014-02-16 MED ORDER — ENOXAPARIN SODIUM 40 MG/0.4ML ~~LOC~~ SOLN: 40.0000 mg | SUBCUTANEOUS | Status: DC

## 2014-02-16 MED ORDER — IBUPROFEN 600 MG PO TABS: 600.0000 mg | ORAL_TABLET | Freq: Four times a day (QID) | ORAL | Status: DC

## 2014-02-16 NOTE — Progress Notes (Signed)
Patient ID: Holly [place], female   DOB: 02/21/1986, 28 y.o.   MRN: [id number] Per relaying of msg by SW, labs requested as part of adoption process. GC/chlam (urine), UDS, and HSV I & II IgG blood titers ordered.  Cam HaiSHAW, Suriya Kovarik CNM 02/16/2014 11:25 AM

## 2014-02-16 NOTE — Discharge Summary (Signed)
`````  Attestation of Attending Supervision of Advanced Practitioner: Evaluation and management procedures were performed by the PA/NP/CNM/OB Fellow under my supervision/collaboration. Chart reviewed and agree with management and plan.  Dom Haverland V 02/16/2014 10:40 PM

## 2014-02-16 NOTE — Progress Notes (Signed)
Clinical Social Work Department PSYCHOSOCIAL ASSESSMENT - MATERNAL/CHILD 02/15/2014 (Late Entry)  Patient:  [place] Hodge, Holly Hodge  Account Number:  [id number]  Admit Date:  02/14/2014  Ardine Eng Name:   Holly Hodge    Clinical Social Worker:  Lucita Ferrara, CLINICAL SOCIAL WORKER   Date/Time:  02/15/2014 03:30 PM  Date Referred:  02/15/2014   Referral source  Central Nursery     Referred reason  Adoption   Other referral source:    I:  FAMILY / [place] legal guardian:  PARENT  Guardian - Name Guardian - Age Guardian - Address  Holly [place] 27 [address] [place], [place] [address]   Other household support members/support persons Name Relationship DOB  Holly [place] DAUGHTER 29 years old  Holly [place] SON 64 years old   FATHER    Other support:   MOB shared that her mother and sister are supportive, but later discussed belief that she is often alone and has few reliable supports.    II  PSYCHOSOCIAL DATA Information Source:  Patient Interview  Insurance risk surveyor Resources Employment:   MOB stated that she has been unemployed, but recently became an an Actor.   Financial resources:  Medicaid If Medicaid - County:  [place] / Grade:  N/A Music therapist / Child Services Coordination / Early Interventions:   N/A  Cultural issues impacting care:   None reported    III  STRENGTHS Strengths  Compliance with medical plan   Strength comment:    IV  RISK FACTORS AND CURRENT PROBLEMS Current Problem:  YES   Risk Factor & Current Problem Patient Issue Family Issue Risk Factor / Current Problem Comment  Substance Abuse Y N MOB admits to smoking THC on a daily basis during her pregnancy. Holly's UDS positive for THC and cocaine.    V  SOCIAL WORK ASSESSMENT CSW met with MOB in her room to complete the assessment. Consult ordered due to MOB placing Holly up for adoption, and Holly's UDS positive for cocaine and THC.  MOB was pleasant and  cooperative throughout the assessment, and became appropriately tearful as she discussed recent death of her significant other of 8 years.    CSW inquired about thoughts and feelings related to the adoption.  MOB shared that when she first learned that she was pregnant, she wanted an abortion due to limited financial resources.  She stated the FOB was excited and wanted the child.  She discussed how they continued to go back and forth about having an abortion, and in the end, she was unable to have the abortion since she was too far along in her pregnancy.  She stated that during this time as well, the FOB, her significant other of 8 years, died suddenly.  MOB disclosed that he went to the hospital for a headache, and died.  CSW continued to assist MOB explore her feelings related to this death as MOB became very tearful and shared that she had never grieved/cried "this much" following his death.  MOB continued to express belief that "it's always one thing after the other", and discussed how it has been difficult for both herself and her other children following his death.   CSW continued to explore how his death impacted her decision to place the Holly up for adoption.  She stated that she had already considered adoption prior to his death.  She is able to articulate the pros/cons of placing the Holly of her adoption, but she  shared that she knows that it is the best interest of herself, the Holly, and her children if she places the Holly up for adoption.  She became tearful as she reflected upon her decision, but stated that she is confident in her decision.   CSW inquired about coping related to the FOB's death.  She identified taking walks, but also admits to discussing smoking cigarettes and THC.  CSW explored thoughts and feelings related to grief counseling, MOB agreeable and expressed interest.   CSW inquired about substance abuse history due to Holly's UDS screens and MOB having other children present in  the home. MOB admits to [facility] use, and shared that she does not know how the Holly had cocaine in her system at birth.  She asked herself if the [facility] had possibly been laced with cocaine.   CSW provided emotional support as MOB signed adoption paperwork with adoption agency and CSW, Ethyl Piedra.       VI SOCIAL WORK PLAN Social Work Plan  Information/Referral to [employer]  Child Scientist, forensic Report  Patient/Family Education   Type of pt/family education:   Post-partum depression and anxiety   If child protective services report - county:  [place] If child protective services report - date:  02/16/2014 Information/referral to community resources comment:   CSW provided resources to patient for therapy for herself and her children.   Other social work plan:   Ongoing emotional support as needed

## 2014-02-16 NOTE — Discharge Summary (Signed)
Obstetric Discharge Summary Reason for Admission: cesarean section Prenatal Procedures: NST Intrapartum Procedures: cesarean: low cervical, transverse Postpartum Procedures: none Complications-Operative and Postpartum: none Hemoglobin  Date Value Ref Range Status  02/15/2014 11.0* 12.0 - 15.0 g/dL Final     HCT  Date Value Ref Range Status  02/15/2014 32.1* 36.0 - 46.0 % Final   [name] is a 28 y.o. female G3P2002 at 1945w3d who [facility] due to a non- reassuring fetal tracing. Originally the patient stated her due date was somewhere around August 20th making her 38 weeks. After researching her previous visit [facility] in December; 6 week US shows the patient's EDD is August 2. Pt has very little prenatal care.   Pt. Was admitted and brought to the operating room for Cesarean delivery. She had no intraoperative complications, and her postoperative course has remained uncomplicated. The patient decided to place her baby up for adoption. 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 This occurred around 1.5 years ago. She will be sent home on prophylactic anticoagulation for six weeks with Lovenox. She would like progesterone mini-pill for contraception with eventual BTL. She is now ambulating ad lib, pain well controlled, +BM, tolerating po, minimal vaginal bleeding, incision C/D/I. She is stable and ready for discharge.    Physical Exam:  General: alert, cooperative and no distress Lochia: appropriate Uterine Fundus: firm Incision: healing well, no significant drainage, no dehiscence, no significant erythema DVT Evaluation: No evidence of DVT seen on physical exam. No cords or calf tenderness.  Discharge Diagnoses: Term Pregnancy-delivered  Discharge Information: Date: 02/16/2014 Activity: unrestricted and pelvic rest Diet: routine Medications: PNV, Ibuprofen and Percocet, Lovenox Condition:  stable Instructions: refer to practice specific booklet Discharge to: home Follow-up Information   [facility]. Schedule an appointment as soon as possible for a visit in 1 week. (Postpartum Follow Up, Elevated DVT Risk)    Specialty:  Obstetrics and Gynecology   Contact information:   [address] [place] [id number] [phone]      Newborn Data: Live born female  Birth Weight: 6 lb 11.8 oz (3055 g) APGAR: 6, 9  Home with mother.  Kegan Mckeithan G 02/16/2014, 8:05 AM

## 2014-02-16 NOTE — Progress Notes (Addendum)
CSW followed-up with MOB in order to explore thoughts and feelings as she prepares for discharge.  MOB shared that she is doing well, continues to feel like she made the right decision to sign the adoption papers.    CSW provided MOB a list of therapy providers in [place], and discussed in detail [facility] and their open access clinic to begin services.  MOB expressed appreciation for the information .   CSW also reviewed signs and symptoms of post-partum depression.  MOB verbalized understanding and acknowledged increase risk factors due to psychosocial stressors and recent loss of her significant other.  CSW discussed need to make CPS report due to baby's UDS being positive for THC and cocaine.  CSW shared that report is being made due to having 2 other children in the home.  MOB continues to share belief that her [facility] must have been laced since she has not ever used any other drug beside THC.  MOB verbalized understanding of the report being made, and asked appropriate questions related to CPS involvement.   2:30pm: CSW met with MOB again and provided her with emotional support as she viewed pictures of the baby (per mother's request).  MOB became tearful as she saw the baby.  As CSW continued to explore and process her feelings related to viewing the picture, she became more tearful.  MOB expressed that she was glad that she saw the photos, and continued to express belief that it was the right decision to place the baby up for adoption.  MOB continued to share the belief that she feels like she has minimal support from her family due to her decision to place the baby up for adoption, but she was able to express to CSW why she knows that she made the right decision.  CSW inquired about plans at discharge, and she stated that she will likely spend time with her other children or by herself/isolated. CSW explored the pros/cons of each option, and MOB able to verbalize importance of staying busy and  interacting with her other children to reduce ruminating.  MOB verbalized awareness of importance of experiencing emotions, but not allowing emotions to consume one's self and hinder normative routines.

## 2014-02-16 NOTE — Discharge Instructions (Signed)
Deep Vein Thrombosis °A deep vein thrombosis (DVT) is a blood clot that develops in the deep, larger veins of the leg, arm, or pelvis. These are more dangerous than clots that might form in veins near the surface of the body. A DVT can lead to serious and even life-threatening complications if the clot breaks off and travels in the bloodstream to the lungs.  °A DVT can damage the valves in your leg veins so that instead of flowing upward, the blood pools in the lower leg. This is called post-thrombotic syndrome, and it can result in pain, swelling, discoloration, and sores on the leg. °CAUSES °Usually, several things contribute to the formation of blood clots. Contributing factors include: °· The flow of blood slows down. °· The inside of the vein is damaged in some way. °· You have a condition that makes blood clot more easily. °RISK FACTORS °Some people are more likely than others to develop blood clots. Risk factors include:  °· Smoking. °· Being overweight (obese). °· Sitting or lying still for a long time. This includes long-distance travel, paralysis, or recovery from an illness or surgery. °Other factors that increase risk are:  °· Older age, especially over 75 years of age. °· Having a family history of blood clots or if you have already had a blot clot. °· Having major or lengthy surgery. This is especially true for surgery on the hip, knee, or belly (abdomen). Hip surgery is particularly high risk. °· Having a long, thin tube (catheter) placed inside a vein during a medical procedure. °· Breaking a hip or leg. °· Having cancer or cancer treatment. °· Pregnancy and childbirth. °¨ Hormone changes make the blood clot more easily during pregnancy. °¨ The fetus puts pressure on the veins of the pelvis. °¨ There is a risk of injury to veins during delivery or a caesarean delivery. The risk is highest just after childbirth. °· Medicines containing the female hormone estrogen. This includes birth control pills and  hormone replacement therapy. °· Other circulation or heart problems. ° °SIGNS AND SYMPTOMS °When a clot forms, it can either partially or totally block the blood flow in that vein. Symptoms of a DVT can include: °· Swelling of the leg or arm, especially if one side is much worse. °· Warmth and redness of the leg or arm, especially if one side is much worse. °· Pain in an arm or leg. If the clot is in the leg, symptoms may be more noticeable or worse when standing or walking. °The symptoms of a DVT that has traveled to the lungs (pulmonary embolism, PE) usually start suddenly and include: °· Shortness of breath. °· Coughing. °· Coughing up blood or blood-tinged mucus. °· Chest pain. The chest pain is often worse with deep breaths. °· Rapid heartbeat. °Anyone with these symptoms should get emergency medical treatment right away. Do not wait to see if the symptoms will go away. Call your local emergency services (911 in the [place] if you have these symptoms. Do not drive yourself to the hospital. °DIAGNOSIS °If a DVT is suspected, your health care provider will take a full medical history and perform a physical exam. Tests that also may be required include: °· Blood tests, including studies of the clotting properties of the blood. °· Ultrasound to see if you have clots in your legs or lungs. °· X-rays to show the flow of blood when dye is injected into the veins (venogram). °· Studies of your lungs if you have any   chest symptoms. °PREVENTION °· Exercise the legs regularly. Take a brisk 30-minute walk every day. °· Maintain a weight that is appropriate for your height. °· Avoid sitting or lying in bed for long periods of time without moving your legs. °· Women, particularly those over the age of 35 years, should consider the risks and benefits of taking estrogen medicines, including birth control pills. °· Do not smoke, especially if you take estrogen medicines. °· Long-distance travel can increase your risk of DVT. You  should exercise your legs by walking or pumping the muscles every hour. °· Many of the risk factors above relate to situations that exist with hospitalization, either for illness, injury, or elective surgery. Prevention may include medical and nonmedical measures. °¨ Your health care provider will assess you for the need for venous thromboembolism prevention when you are admitted to the hospital. If you are having surgery, your surgeon will assess you the day of or day after surgery. °TREATMENT °Once identified, a DVT can be treated. It can also be prevented in some circumstances. Once you have had a DVT, you may be at increased risk for a DVT in the future. The most common treatment for DVT is blood-thinning (anticoagulant) medicine, which reduces the blood's tendency to clot. Anticoagulants can stop new blood clots from forming and stop old clots from growing. They cannot dissolve existing clots. Your body does this by itself over time. Anticoagulants can be given by mouth, through an IV tube, or by injection. Your health care provider will determine the best program for you. Other medicines or treatments that may be used are: °· Heparin or related medicines (low molecular weight heparin) are often the first treatment for a blood clot. They act quickly. However, they cannot be taken orally and must be given either in shot form or by IV tube. °¨ Heparin can cause a fall in a component of blood that stops bleeding and forms blood clots (platelets). You will be monitored with blood tests to be sure this does not occur. °· Warfarin is an anticoagulant that can be swallowed. It takes a few days to start working, so usually heparin or related medicines are used in combination. Once warfarin is working, heparin is usually stopped. °· Factor Xa inhibitor medicines, such as rivaroxaban and apixaban, also reduce blood clotting. These medicines are taken orally and can often be used without heparin or related  medicines. °· Less commonly, clot dissolving drugs (thrombolytics) are used to dissolve a DVT. They carry a high risk of bleeding, so they are used mainly in severe cases where your life or a part of your body is threatened. °· Very rarely, a blood clot in the leg needs to be removed surgically. °· If you are unable to take anticoagulants, your health care provider may arrange for you to have a filter placed in a main vein in your abdomen. This filter prevents clots from traveling to your lungs. °HOME CARE INSTRUCTIONS °· Take all medicines as directed by your health care provider. °· Learn as much as you can about DVT. °· Wear a medical alert bracelet or carry a medical alert card. °· Ask your health care provider how soon you can go back to normal activities. It is important to stay active to prevent blood clots. If you are on anticoagulant medicine, avoid contact sports. °· It is very important to exercise. This is especially important while traveling, sitting, or standing for long periods of time. Exercise your legs by walking or by   tightening and relaxing your leg muscles regularly. Take frequent walks. °· You may need to wear compression stockings. These are tight elastic stockings that apply pressure to the lower legs. This pressure can help keep the blood in the legs from clotting. °Taking Warfarin °Warfarin is a daily medicine that is taken by mouth. Your health care provider will advise you on the length of treatment (usually 3-6 months, sometimes lifelong). If you take warfarin: °· Understand how to take warfarin and foods that can affect how warfarin works in your body. °· Too much and too little warfarin are both dangerous. Too much warfarin increases the risk of bleeding. Too little warfarin continues to allow the risk for blood clots. °Warfarin and Regular Blood Testing °While taking warfarin, you will need to have regular blood tests to measure your blood clotting time. These blood tests usually  include both the prothrombin time (PT) and international normalized ratio (INR) tests. The PT and INR results allow your health care provider to adjust your dose of warfarin. It is very important that you have your PT and INR tested as often as directed by your health care provider.    °Warfarin and Your Diet °Avoid major changes in your diet, or notify your health care provider before changing your diet. Arrange a visit with a registered dietitian to answer your questions. Many foods, especially foods high in vitamin K, can interfere with warfarin and affect the PT and INR results. You should eat a consistent amount of foods high in vitamin K. Foods high in vitamin K include:  °· Spinach, kale, broccoli, cabbage, collard and turnip greens, Brussels sprouts, peas, cauliflower, seaweed, and parsley. °· Beef and pork liver. °· Green tea. °· Soybean oil. °Warfarin with Other Medicines °Many medicines can interfere with warfarin and affect the PT and INR results. You must: °· Tell your health care provider about any and all medicines, vitamins, and supplements you take, including aspirin and other over-the-counter anti-inflammatory medicines. Be especially cautious with aspirin and anti-inflammatory medicines. Ask your health care provider before taking these. °· Do not take or discontinue any prescribed or over-the-counter medicine except on the advice of your health care provider or pharmacist. °Warfarin Side Effects °Warfarin can have side effects, such as easy bruising and difficulty stopping bleeding. Ask your health care provider or pharmacist about other side effects of warfarin. You will need to: °· Hold pressure over cuts for longer than usual. °· Notify your dentist and other health care providers that you are taking warfarin before you undergo any procedures where bleeding may occur. °Warfarin with Alcohol and Tobacco  °· Drinking alcohol frequently can increase the effect of warfarin, leading to excess  bleeding. It is best to avoid alcoholic drinks or to consume only very small amounts while taking warfarin. Notify your health care provider if you change your alcohol intake.   °· Do not use any tobacco products including cigarettes, chewing tobacco, or electronic cigarettes. If you smoke, quit. Ask your health care provider for help with quitting smoking. °Alternative Medicines to Warfarin: Factor Xa Inhibitor Medicines °· These blood-thinning medicines are taken by mouth, usually for several weeks or longer. It is important to take the medicine every single day at the same time each day. °· There are no regular blood tests required when using these medicines. °· There are fewer food and drug interactions than with warfarin. °· The side effects of this class of medicine are similar to those of warfarin, including excessive bruising or bleeding. Ask your   health care provider or pharmacist about other potential side effects. SEEK MEDICAL CARE IF:  You notice a rapid heartbeat.  You feel weaker or more tired than usual.  You feel faint.  You notice increased bruising.  You feel your symptoms are not getting better in the time expected.  You believe you are having side effects of medicine. SEEK IMMEDIATE MEDICAL CARE IF:  You have chest pain.  You have trouble breathing.  You have new or increased swelling or pain in one leg.  You cough up blood.  You notice blood in vomit, in a bowel movement, or in urine. MAKE SURE YOU:  Understand these instructions.  Will watch your condition.  Will get help right away if you are not doing well or get worse. Document Released: 06/22/2005 Document Revised: 11/06/2013 Document Reviewed: 02/27/2013 [facility] Patient Information 2015 [place], [place]. This information is not intended to replace advice given to you by your health care provider. Make sure you discuss any questions you have with your health care provider. Cesarean Delivery, Care  After Refer to this sheet in the next few weeks. These instructions provide you with information on caring for yourself after your procedure. Your health care provider may also give you specific instructions. Your treatment has been planned according to current medical practices, but problems sometimes occur. Call your health care provider if you have any problems or questions after you go home. HOME CARE INSTRUCTIONS  Only take over-the-counter or prescription medications as directed by your health care provider.  Do not drink alcohol, especially if you are breastfeeding or taking medication to relieve pain.  Do not chew or smoke tobacco.  Continue to use good perineal care. Good perineal care includes:  Wiping your perineum from front to back.  Keeping your perineum clean.  Check your surgical cut (incision) daily for increased redness, drainage, swelling, or separation of skin.  Clean your incision gently with soap and water every day, and then pat it dry. If your health care provider says it is okay, leave the incision uncovered. Use a bandage (dressing) if the incision is draining fluid or appears irritated. If the adhesive strips across the incision do not fall off within 7 days, carefully peel them off.  Hug a pillow when coughing or sneezing until your incision is healed. This helps to relieve pain.  Do not use tampons or douche until your health care provider says it is okay.  Shower, wash your hair, and take tub baths as directed by your health care provider.  Wear a well-fitting bra that provides breast support.  Limit wearing support panties or control-top hose.  Drink enough fluids to keep your urine clear or pale yellow.  Eat high-fiber foods such as whole grain cereals and breads, brown rice, beans, and fresh fruits and vegetables every day. These foods may help prevent or relieve constipation.  Resume activities such as climbing stairs, driving, lifting, exercising,  or traveling as directed by your health care provider.  Talk to your health care provider about resuming sexual activities. This is dependent upon your risk of infection, your rate of healing, and your comfort and desire to resume sexual activity.  Try to have someone help you with your household activities and your newborn for at least a few days after you leave the hospital.  Rest as much as possible. Try to rest or take a nap when your newborn is sleeping.  Increase your activities gradually.  Keep all of your scheduled postpartum appointments.  It is very important to keep your scheduled follow-up appointments. At these appointments, your health care provider will be checking to make sure that you are healing physically and emotionally. SEEK MEDICAL CARE IF:   You are passing large clots from your vagina. Save any clots to show your health care provider.  You have a foul smelling discharge from your vagina.  You have trouble urinating.  You are urinating frequently.  You have pain when you urinate.  You have a change in your bowel movements.  You have increasing redness, pain, or swelling near your incision.  You have pus draining from your incision.  Your incision is separating.  You have painful, hard, or reddened breasts.  You have a severe headache.  You have blurred vision or see spots.  You feel sad or depressed.  You have thoughts of hurting yourself or your newborn.  You have questions about your care, the care of your newborn, or medications.  You are dizzy or light-headed.  You have a rash.  You have pain, redness, or swelling at the site of the removed intravenous access (IV) tube.  You have nausea or vomiting.  You stopped breastfeeding and have not had a menstrual period within 12 weeks of stopping.  You are not breastfeeding and have not had a menstrual period within 12 weeks of delivery.  You have a fever. SEEK IMMEDIATE MEDICAL CARE  IF:  You have persistent pain.  You have chest pain.  You have shortness of breath.  You faint.  You have leg pain.  You have stomach pain.  Your vaginal bleeding saturates 2 or more sanitary pads in 1 hour. MAKE SURE YOU:   Understand these instructions.  Will watch your condition.  Will get help right away if you are not doing well or get worse. Document Released: 03/14/2002 Document Revised: 11/06/2013 Document Reviewed: 02/17/2012 [facility] Patient Information 2015 [place], [place]. This information is not intended to replace advice given to you by your health care provider. Make sure you discuss any questions you have with your health care provider.

## 2014-02-20 LAB — HSV(HERPES SIMPLEX VRS) I + II AB-IGG
HSV 1 Glycoprotein G Ab, IgG: 7.96 IV — ABNORMAL HIGH
HSV 2 Glycoprotein G Ab, IgG: <0.1 IV

## 2014-02-22 LAB — GC/CHLAMYDIA PROBE AMP
CT PROBE, AMP APTIMA: NEGATIVE
GC PROBE AMP APTIMA: NEGATIVE

## 2014-02-23 ENCOUNTER — Ambulatory Visit: Payer: Medicaid Other | Admitting: Obstetrics and Gynecology

## 2014-02-23 ENCOUNTER — Telehealth: Payer: Self-pay | Admitting: Obstetrics and Gynecology

## 2014-02-23 NOTE — Telephone Encounter (Signed)
Pt states that she had a c-section, 02/14/14 and wants to know when she can take off the dressing. I spoke with JVf and he advised that the pt should take off the dressing off right now. Pt advised of this and that it is ok to shower.

## 2014-02-26 ENCOUNTER — Encounter: Payer: Self-pay | Admitting: Obstetrics and Gynecology

## 2014-02-26 ENCOUNTER — Ambulatory Visit (INDEPENDENT_AMBULATORY_CARE_PROVIDER_SITE_OTHER): Payer: Medicaid Other | Admitting: Obstetrics and Gynecology

## 2014-02-26 VITALS — BP 142/88 | Ht 65.0 in | Wt 207.8 lb

## 2014-02-26 DIAGNOSIS — Z9889 Other specified postprocedural states: Secondary | ICD-10-CM

## 2014-02-26 NOTE — Progress Notes (Signed)
This chart was scribed by Leone Payor, Medical Scribe, for Dr. Christin Bach on 02/26/14 at 11:55 AM. This chart was reviewed by Dr. Christin Bach for accuracy.   Subjective:  Holly Hodge is a 28 y.o. female who presents for a 12 days postpartum visit s/p cesarean Patient concerns: taking Lovenox vs warfarin due to hx PULM EMBOLUS 2014 while on OC and smoking. Still smoking at 1/2 ppd Prenatal and intrapartum course notable for  Minimal pnc, baby was given up for adoption.     Patient is not sexually active.   The following portions of the patient's history were reviewed and updated as appropriate: allergies, current medications, past family history, past medical history, past surgical history and problem list.  Review of Systems    See Subjective, otherwise negative ROS.  Objective:  BP 142/88  Ht  (1.651 m)  Wt 207 lb 12.8 oz (94.257 kg)  BMI 34.58 kg/m2  LMP 04/23/2013  General:  alert, cooperative and no distress     Lungs: clear to auscultation bilaterally  Heart:  regular rate and rhythm, S1, S2 normal, no murmur  Abdomen: soft, non-tender; bowel sounds normal; no masses,  no organomegaly Incision is well healing without erythema, swelling, or discharge      Assessment:  1.  postpartum exam.  2. Anti-coagulation due to h/o PE 3. Contraception: abstinence. Will schedule BTL in 4 weeks    Plan:  BTL papers signed today. Will schedule pre op visit for 4 weeks.

## 2014-02-26 NOTE — Progress Notes (Signed)
LAPAROSCOPIC BILATERAL SALPINGECTOMY

## 2014-03-13 ENCOUNTER — Encounter (HOSPITAL_COMMUNITY): Payer: Self-pay | Admitting: Pharmacy Technician

## 2014-03-19 ENCOUNTER — Other Ambulatory Visit: Payer: Self-pay | Admitting: Obstetrics and Gynecology

## 2014-03-19 ENCOUNTER — Ambulatory Visit (INDEPENDENT_AMBULATORY_CARE_PROVIDER_SITE_OTHER): Payer: Medicaid Other | Admitting: Obstetrics and Gynecology

## 2014-03-19 ENCOUNTER — Encounter: Payer: Self-pay | Admitting: Obstetrics and Gynecology

## 2014-03-19 NOTE — Progress Notes (Signed)
Patient ID: Holly Hodge, female   DOB: [birth year], 28 y.o.   MRN: [id number]  Subjective:  Holly Hodge is a 28 y.o. female who presents for a 4 weeks postpartum visit after a pregnancy which was notable for NO prenatal care, and she presented to [facility] with Nonreassuring FHT, was transferrred at 38+ wk to [facility] hospital and underwent Repeat cesarean.  She had not signed tubal papers, apparently did not articulate desire for Tubal in time for performance at cesarean.\  Postpartum depression scale 15, with pt denying SI/HI, she is caring for her 2 at home, desired permanent sterilization, and denies that there is any ambivalence.  Patient concerns: right sided pain aggravated with coughing and sneezing.  Putting pressure on the area alleviates the pain. Pt states that she has noticed that her ankles swell when she is up moving around. She has a history of three c-sections and gave her last child up for adoption.  She had a PE during her second delivery 2013, and was prescribed  of Lovenox. 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  She is still taking the medication.  She states that she has never used cocaine.  She smokes marijuana.    Prenatal and intrapartum course notable for no PNC, NO Thromboprophylaxis til postpartum from cesarean.  Patient is not sexually active.   The following portions of the patient's history were reviewed and updated as appropriate: allergies, current medications, past family history, past medical history, past surgical history and problem list.  Review of Systems    See Subjective, otherwise negative ROS.  Objective:  BP 100/70  Ht  (1.651 m)  Wt 207 lb (93.895 kg)  BMI 34.45 kg/m2  Breastfeeding? No  General:  alert, cooperative and no distress     Lungs: clear to auscultation bilaterally  Heart:  regular rate and rhythm, S1, S2 normal, no murmur  Abdomen: soft, non-tender; bowel sounds normal; no masses,  no organomegaly   Vulva:  normal   Vagina: normal vagina  Cervix:  normal  Corpus: normal size, contour, position retroverted, normal consistency, mobility, non-tender  Adnexa:  normal adnexa, retroverted uterus that still feels slightly englarged          Assessment:  1.  postpartum exam.  2. Contraception: tubal ligation planned for 9/22. 3.  Hx Postpartum Pulm Embolus 2013  with current anticoagulation prophylaxis by Lovenox 40 mg q day(hs) planned for 6 wk postpartum.    Plan:  1/ Pt aware of social support systems locally 2. Pt desires to proceed toward tubal sterilization as scheduled 9/22; 3. Will d/c Lovenox after dose on 9/21 , restart on 9/23 4. Bilateral laparoscopic salpingectomy planned for 9/22   This chart was scribed by Holly Hodge, Medical Scribe, for Dr. Christin Bach on 03/19/2014 at 12:22 PM. This chart was reviewed by Dr. Christin Bach for accuracy.

## 2014-03-20 ENCOUNTER — Other Ambulatory Visit: Payer: Self-pay | Admitting: Obstetrics and Gynecology

## 2014-03-20 ENCOUNTER — Encounter (HOSPITAL_COMMUNITY): Admission: RE | Admit: 2014-03-20 | Payer: No Typology Code available for payment source | Source: Ambulatory Visit

## 2014-03-20 NOTE — H&P (Signed)
  Patient ID: Holly Hodge, female DOB: [birth year], 28 y.o. MRN: [id number]  Subjective:   Holly Hodge is a 28 y.o. female who presents for a 4 weeks postpartum visit after a pregnancy which was notable for NO prenatal care, and she presented to [facility] with Nonreassuring FHT, was transferrred at 38+ wk to [facility] hospital and underwent Repeat cesarean. She had not signed tubal papers, apparently did not articulate desire for Tubal in time for performance at cesarean.\  Postpartum depression scale 15, with pt denying SI/HI, she is caring for her 2 at home, desired permanent sterilization, and denies that there is Holly ambivalence.  Patient concerns: right sided pain aggravated with coughing and sneezing. Putting pressure on the area alleviates the pain. Pt states that she has noticed that her ankles swell when she is up moving around. She has a history of three c-sections and gave her last child up for adoption. She had a PE during her second delivery 2013, and was prescribed  of Lovenox. 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 She is still taking the medication. She states that she has never used cocaine. She smokes marijuana.  Prenatal and intrapartum course notable for no PNC, NO Thromboprophylaxis til postpartum from cesarean.  Patient is not sexually active.  The following portions of the patient's history were reviewed and updated as appropriate: allergies, current medications, past family history, past medical history, past surgical history and problem list.  Review of Systems See Subjective, otherwise negative ROS.  Objective:   BP 100/70  Ht  (1.651 m)  Wt 207 lb (93.895 kg)  BMI 34.45 kg/m2  Breastfeeding? No  General:  alert, cooperative and no distress      Lungs:  clear to auscultation bilaterally   Heart:  regular rate and rhythm, S1, S2 normal, no murmur   Abdomen:  soft, non-tender; bowel sounds normal; no masses, no organomegaly   Vulva:  normal   Vagina:  normal  vagina   Cervix:  normal   Corpus:  normal size, contour, position retroverted, normal consistency, mobility, non-tender   Adnexa:  normal adnexa, retroverted uterus that still feels slightly englarged       Assessment:   1. postpartum exam.  2. Contraception: tubal ligation planned for 9/22.  3. Hx Postpartum Pulm Embolus 2013  with current anticoagulation prophylaxis by Lovenox 40 mg q day(hs) planned for 6 wk postpartum.  Plan:   1/ Pt aware of social support systems locally  2. Pt desires to proceed toward tubal sterilization as scheduled 9/22;  3. Will d/c Lovenox after dose on 9/21 , restart on 9/23  4. Bilateral laparoscopic salpingectomy planned for 9/22

## 2014-03-23 ENCOUNTER — Encounter (HOSPITAL_COMMUNITY)
Admission: RE | Admit: 2014-03-23 | Discharge: 2014-03-23 | Disposition: A | Discharge status: Home / Self Care | Payer: Medicaid Other | Source: Ambulatory Visit | Attending: Obstetrics and Gynecology | Admitting: Obstetrics and Gynecology

## 2014-03-23 ENCOUNTER — Encounter (HOSPITAL_COMMUNITY): Payer: Self-pay

## 2014-03-23 DIAGNOSIS — Z01810 Encounter for preprocedural cardiovascular examination: Secondary | ICD-10-CM | POA: Insufficient documentation

## 2014-03-23 DIAGNOSIS — Z01812 Encounter for preprocedural laboratory examination: Secondary | ICD-10-CM | POA: Diagnosis present

## 2014-03-23 DIAGNOSIS — Z309 Encounter for contraceptive management, unspecified: Secondary | ICD-10-CM | POA: Insufficient documentation

## 2014-03-23 LAB — URINE MICROSCOPIC-ADD ON

## 2014-03-23 LAB — HCG, SERUM, QUALITATIVE: PREG SERUM: NEGATIVE

## 2014-03-23 LAB — CBC
HCT: 39.6 % (ref 36.0–46.0)
Hemoglobin: 13.3 g/dL (ref 12.0–15.0)
MCH: 30.8 pg (ref 26.0–34.0)
MCHC: 33.6 g/dL (ref 30.0–36.0)
MCV: 91.7 fL (ref 78.0–100.0)
Platelets: 324 10*3/uL (ref 150–400)
RBC: 4.32 MIL/uL (ref 3.87–5.11)
RDW: 12.8 % (ref 11.5–15.5)
WBC: 5.5 10*3/uL (ref 4.0–10.5)

## 2014-03-23 LAB — BASIC METABOLIC PANEL
Anion gap: 11 (ref 5–15)
BUN: 11 mg/dL (ref 6–23)
CALCIUM: 9.4 mg/dL (ref 8.4–10.5)
CO2: 25 mEq/L (ref 19–32)
Chloride: 104 mEq/L (ref 96–112)
Creatinine, Ser: 1.14 mg/dL — ABNORMAL HIGH (ref 0.50–1.10)
GFR, EST AFRICAN AMERICAN: 75 mL/min — AB (ref >90)
GFR, EST NON AFRICAN AMERICAN: 65 mL/min — AB (ref >90)
Glucose, Bld: 90 mg/dL (ref 70–99)
POTASSIUM: 4.1 meq/L (ref 3.7–5.3)
SODIUM: 140 meq/L (ref 137–147)

## 2014-03-23 LAB — URINALYSIS, ROUTINE W REFLEX MICROSCOPIC
Bilirubin Urine: NEGATIVE
Glucose, UA: NEGATIVE mg/dL
Ketones, ur: NEGATIVE mg/dL
Leukocytes, UA: NEGATIVE
NITRITE: NEGATIVE
PROTEIN: NEGATIVE mg/dL
SPECIFIC GRAVITY, URINE: 1.025 (ref 1.005–1.030)
UROBILINOGEN UA: 0.2 mg/dL (ref 0.0–1.0)
pH: 5.5 (ref 5.0–8.0)

## 2014-03-23 NOTE — Patient Instructions (Signed)
Holly Hodge  03/23/2014   Your procedure is scheduled on:  03/27/2014  Report to [facility] at  615  AM.  Call this number if you have problems the morning of surgery: [phone]   Remember:   Do not eat food or drink liquids after midnight.   Take these medicines the morning of surgery with A SIP OF WATER:  percocet   Do not wear jewelry, make-up or nail polish.  Do not wear lotions, powders, or perfumes.   Do not shave 48 hours prior to surgery. Men may shave face and neck.  Do not bring valuables to the hospital.  [facility] is not responsible for any belongings or valuables.               Contacts, dentures or bridgework may not be worn into surgery.  Leave suitcase in the car. After surgery it may be brought to your room.  For patients admitted to the hospital, discharge time is determined by your treatment team.               Patients discharged the day of surgery will not be allowed to drive home.  Name and phone number of your driver: family  Special Instructions: Shower using CHG 2 nights before surgery and the night before surgery.  If you shower the day of surgery use CHG.  Use special wash - you have one bottle of CHG for all showers.  You should use approximately 1/3 of the bottle for each shower.   Please read over the following fact sheets that you were given: Pain Booklet, Coughing and Deep Breathing, Surgical Site Infection Prevention, Anesthesia Post-op Instructions and Care and Recovery After Surgery Sterilization Information, Female Female sterilization is a procedure to permanently prevent pregnancy. There are different ways to perform sterilization, but all either block or close the fallopian tubes so that your eggs cannot reach your uterus. If your egg cannot reach your uterus, sperm cannot fertilize the egg, and you cannot get pregnant.  Sterilization is performed by a surgical procedure. Sometimes these procedures are performed in a hospital while a  patient is asleep. Sometimes they can be done in a clinic setting with the patient awake. The fallopian tubes can be surgically cut, tied, or sealed through a procedure called tubal ligation. The fallopian tubes can also be closed with clips or rings. Sterilization can also be done by placing a tiny coil into each fallopian tube, which causes scar tissue to grow inside the tube. The scar tissue then blocks the tubes.  Discuss sterilization with your caregiver to answer any concerns you or your partner may have. You may want to ask what type of sterilization your caregiver performs. Some caregivers may not perform all the various options. Sterilization is permanent and should only be done if you are sure you do not want children or do not want any more children. Having a sterilization reversed may not be successful.  STERILIZATION PROCEDURES  Laparoscopic sterilization. This is a surgical method performed at a time other than right after childbirth. Two incisions are made in the lower abdomen. A thin, lighted tube (laparoscope) is inserted into one of the incisions and is used to perform the procedure. The fallopian tubes are closed with a ring or a clip. An instrument that uses heat could be used to seal the tubes closed (electrocautery).   Mini-laparotomy. This is a surgical method done 1 or 2 days after giving birth. Typically,  a small incision is made just below the belly button (umbilicus) and the fallopian tubes are exposed. The tubes can then be sealed, tied, or cut.   Hysteroscopic sterilization. This is performed at a time other than right after childbirth. A tiny, spring-like coil is inserted through the cervix and uterus and placed into the fallopian tubes. The coil causes scaring and blocks the tubes. Other forms of contraception should be used for 3 months after the procedure to allow the scar tissue to form completely. Additionally, it is required hysterosalpingography be done 3 months later  to ensure that the procedure was successful. Hysterosalpingography is a procedure that uses X-rays to look at your uterus and fallopian tubes after a material to make them show up better has been inserted. IS STERILIZATION SAFE? Sterilization is considered safe with very rare complications. Risks depend on the type of procedure you have. As with any surgical procedure, there are risks. Some risks of sterilization by any means include:   Bleeding.  Infection.  Reaction to anesthesia medicine.  Injury to surrounding organs. Risks specific to having hysteroscopic coils placed include:  The coils may not be placed correctly the first time.   The coils may move out of place.   The tubes may not get completely blocked after 3 months.   Injury to surrounding organs when placing the coil.  HOW EFFECTIVE IS FEMALE STERILIZATION? Sterilization is nearly 100% effective, but it can fail. Depending on the type of sterilization, the rate of failure can be as high as 3%. After hysteroscopic sterilization with placement of fallopian tube coils, you will need back-up birth control for 3 months after the procedure. Sterilization is effective for a lifetime.  BENEFITS OF STERILIZATION  It does not affect your hormones, and therefore will not affect your menstrual periods, sexual desire, or performance.   It is effective for a lifetime.   It is safe.   You do not need to worry about getting pregnant. Keep in mind that if you had the hysteroscopic placement procedure, you must wait 3 months after the procedure (or until your caregiver confirms) before pregnancy is not considered possible.   There are no side effects unlike other types of birth control (contraception).  DRAWBACKS OF STERILIZATION  You must be sure you do not want children or any more children. The procedure is permanent.   It does not provide protection against sexually transmitted infections (STIs).   The tubes can  grow back together. If this happens, there is a risk of pregnancy. There is also an increased risk (50%) of pregnancy being an ectopic pregnancy. This is a pregnancy that happens outside of the uterus. Document Released: 12/09/2007 Document Revised: 06/27/2013 Document Reviewed: 10/08/2011 [facility] Patient Information 2015 [place], [place]. This information is not intended to replace advice given to you by your health care provider. Make sure you discuss any questions you have with your health care provider. PATIENT INSTRUCTIONS POST-ANESTHESIA  IMMEDIATELY FOLLOWING SURGERY:  Do not drive or operate machinery for the first twenty four hours after surgery.  Do not make any important decisions for twenty four hours after surgery or while taking narcotic pain medications or sedatives.  If you develop intractable nausea and vomiting or a severe headache please notify your doctor immediately.  FOLLOW-UP:  Please make an appointment with your surgeon as instructed. You do not need to follow up with anesthesia unless specifically instructed to do so.  WOUND CARE INSTRUCTIONS (if applicable):  Keep a dry clean dressing  on the anesthesia/puncture wound site if there is drainage.  Once the wound has quit draining you may leave it open to air.  Generally you should leave the bandage intact for twenty four hours unless there is drainage.  If the epidural site drains for more than 36-48 hours please call the anesthesia department.  QUESTIONS?:  Please feel free to call your physician or the hospital operator if you have any questions, and they will be happy to assist you.

## 2014-03-23 NOTE — Pre-Procedure Instructions (Signed)
Patient given information to sign up for my chart at home. 

## 2014-03-26 ENCOUNTER — Encounter (HOSPITAL_COMMUNITY): Payer: Self-pay | Admitting: Anesthesiology

## 2014-03-26 ENCOUNTER — Ambulatory Visit: Payer: Medicaid Other | Admitting: Obstetrics and Gynecology

## 2014-03-26 NOTE — OR Nursing (Signed)
PT / INR   Not needed per Dr. Emelda Fear.

## 2014-03-27 ENCOUNTER — Encounter (HOSPITAL_COMMUNITY)
Admission: RE | Disposition: A | Discharge status: Home / Self Care | Payer: Self-pay | Source: Ambulatory Visit | Attending: Obstetrics and Gynecology

## 2014-03-27 ENCOUNTER — Encounter (HOSPITAL_COMMUNITY): Payer: Self-pay | Admitting: *Deleted

## 2014-03-27 ENCOUNTER — Ambulatory Visit (HOSPITAL_COMMUNITY)
Admission: RE | Admit: 2014-03-27 | Discharge: 2014-03-27 | Disposition: A | Discharge status: Home / Self Care | Payer: Medicaid Other | Source: Ambulatory Visit | Attending: Obstetrics and Gynecology | Admitting: Obstetrics and Gynecology

## 2014-03-27 DIAGNOSIS — Z01812 Encounter for preprocedural laboratory examination: Secondary | ICD-10-CM | POA: Insufficient documentation

## 2014-03-27 DIAGNOSIS — Z5309 Procedure and treatment not carried out because of other contraindication: Secondary | ICD-10-CM | POA: Insufficient documentation

## 2014-03-27 DIAGNOSIS — Z302 Encounter for sterilization: Secondary | ICD-10-CM | POA: Diagnosis present

## 2014-03-27 LAB — RAPID URINE DRUG SCREEN, HOSP PERFORMED
Amphetamines: NOT DETECTED
BARBITURATES: NOT DETECTED
Benzodiazepines: NOT DETECTED
Cocaine: POSITIVE — AB
Opiates: NOT DETECTED
Tetrahydrocannabinol: POSITIVE — AB

## 2014-03-27 SURGERY — CANCELLED PROCEDURE

## 2014-03-27 SURGICAL SUPPLY — 39 items
BAG HAMPER (MISCELLANEOUS) ×4 IMPLANT
BANDAGE STRIP 1X3 FLEXIBLE (GAUZE/BANDAGES/DRESSINGS) ×12 IMPLANT
BLADE 11 SAFETY STRL DISP (BLADE) ×4 IMPLANT
CATH ROBINSON RED A/P 16FR (CATHETERS) ×4 IMPLANT
CLOSURE STERI-STRIP 1/4X4 (GAUZE/BANDAGES/DRESSINGS) ×4 IMPLANT
CLOSURE WOUND 1/2 X4 (GAUZE/BANDAGES/DRESSINGS) ×1
CLOTH BEACON ORANGE TIMEOUT ST (SAFETY) ×4 IMPLANT
COVER LIGHT HANDLE STERIS (MISCELLANEOUS) ×8 IMPLANT
DECANTER SPIKE VIAL GLASS SM (MISCELLANEOUS) ×4 IMPLANT
DURAPREP 26ML APPLICATOR (WOUND CARE) ×4 IMPLANT
ELECT REM PT RETURN 9FT ADLT (ELECTROSURGICAL) ×3
ELECTRODE REM PT RTRN 9FT ADLT (ELECTROSURGICAL) ×2 IMPLANT
FILTER SMOKE EVAC LAPAROSHD (FILTER) ×4 IMPLANT
GLOVE BIOGEL PI IND STRL 9 (GLOVE) ×4 IMPLANT
GLOVE BIOGEL PI INDICATOR 9 (GLOVE) ×4
GLOVE ECLIPSE 9.0 STRL (GLOVE) ×4 IMPLANT
GOWN SPEC L3 XXLG W/TWL (GOWN DISPOSABLE) ×8 IMPLANT
GOWN STRL REUS W/TWL LRG LVL3 (GOWN DISPOSABLE) ×3 IMPLANT
INST SET LAPROSCOPIC GYN AP (KITS) ×4 IMPLANT
KIT ROOM TURNOVER APOR (KITS) ×4 IMPLANT
LIGASURE 5MM LAPAROSCOPIC (INSTRUMENTS) IMPLANT
NEEDLE INSUFFLATION 120MM (ENDOMECHANICALS) ×4 IMPLANT
NS IRRIG 1000ML POUR BTL (IV SOLUTION) ×3 IMPLANT
PACK PERI GYN (CUSTOM PROCEDURE TRAY) ×4 IMPLANT
PAD ARMBOARD 7.5X6 YLW CONV (MISCELLANEOUS) ×4 IMPLANT
SCALPEL HARMONIC ACE (MISCELLANEOUS) IMPLANT
SET BASIN LINEN APH (SET/KITS/TRAYS/PACK) ×4 IMPLANT
SET IRRIG TUBING LAPAROSCOPIC (IRRIGATION / IRRIGATOR) IMPLANT
SOLUTION ANTI FOG 6CC (MISCELLANEOUS) ×4 IMPLANT
STRIP CLOSURE SKIN 1/2X4 (GAUZE/BANDAGES/DRESSINGS) ×2 IMPLANT
SUT VIC AB 4-0 PS2 27 (SUTURE) ×4 IMPLANT
SYR BULB IRRIGATION 50ML (SYRINGE) ×4 IMPLANT
SYRINGE 12CC LL (MISCELLANEOUS) ×4 IMPLANT
TRAY FOLEY CATH 16FR SILVER (SET/KITS/TRAYS/PACK) IMPLANT
TROCAR SLEEVE XCEL 5X75 (ENDOMECHANICALS) ×4 IMPLANT
TROCAR XCEL NON-BLD 5MMX100MML (ENDOMECHANICALS) ×4 IMPLANT
TUBING INSUFFLATION (TUBING) ×4 IMPLANT
WARMER LAPAROSCOPE (MISCELLANEOUS) ×4 IMPLANT
WATER STERILE IRR 1000ML POUR (IV SOLUTION) ×3 IMPLANT

## 2014-03-27 NOTE — Progress Notes (Addendum)
ua to lab for stat drug screen. States she smoked marajuana 2 days ago. Denies any other drug use.

## 2014-03-27 NOTE — Progress Notes (Signed)
Urine drug screen positive for cocaine and marajuana. Drs Jayme Cloud and Emelda Fear notified. Surgery cancelled. Dr Emelda Fear in to talk with pt. Still denies cocaine use.

## 2014-04-03 ENCOUNTER — Encounter: Payer: Medicaid Other | Admitting: Obstetrics and Gynecology

## 2014-04-10 ENCOUNTER — Ambulatory Visit (INDEPENDENT_AMBULATORY_CARE_PROVIDER_SITE_OTHER): Payer: Medicaid Other | Admitting: Women's Health

## 2014-04-10 ENCOUNTER — Encounter: Payer: Self-pay | Admitting: Women's Health

## 2014-04-10 VITALS — BP 104/64 | Ht 65.0 in | Wt 209.0 lb

## 2014-04-10 DIAGNOSIS — F1490 Cocaine use, unspecified, uncomplicated: Secondary | ICD-10-CM

## 2014-04-10 DIAGNOSIS — N760 Acute vaginitis: Secondary | ICD-10-CM

## 2014-04-10 DIAGNOSIS — N926 Irregular menstruation, unspecified: Secondary | ICD-10-CM

## 2014-04-10 DIAGNOSIS — A499 Bacterial infection, unspecified: Secondary | ICD-10-CM

## 2014-04-10 DIAGNOSIS — Z3009 Encounter for other general counseling and advice on contraception: Secondary | ICD-10-CM

## 2014-04-10 DIAGNOSIS — B9689 Other specified bacterial agents as the cause of diseases classified elsewhere: Secondary | ICD-10-CM | POA: Insufficient documentation

## 2014-04-10 DIAGNOSIS — Z3202 Encounter for pregnancy test, result negative: Secondary | ICD-10-CM

## 2014-04-10 LAB — POCT WET PREP (WET MOUNT): Clue Cells Wet Prep Whiff POC: POSITIVE

## 2014-04-10 LAB — POCT URINE PREGNANCY: PREG TEST UR: NEGATIVE

## 2014-04-10 MED ORDER — METRONIDAZOLE 500 MG PO TABS: 500.0000 mg | ORAL_TABLET | Freq: Two times a day (BID) | ORAL | Status: DC

## 2014-04-10 MED ORDER — MEDROXYPROGESTERONE ACETATE 150 MG/ML IM SUSP: 150.0000 mg | INTRAMUSCULAR | Status: DC

## 2014-04-10 NOTE — Progress Notes (Signed)
Patient ID: Holly Hodge, female   DOB: 11/11/1985, 28 y.o.   MRN: [id number]   [facility] Visit  Patient name: Holly Hodge MRN [id number]  Date of birth: [birth year]  CC & HPI:  Holly Hodge is a 28 y.o. African American female presenting today to discuss birth control options. She is 8wks s/p RLTCS on 02/14/14 after no pnc and presenting to [facility] w/ fetal distress and being transferred to [facility] for delivery. Gave baby up for adoption. She had planned on BTL, was scheduled for 9/22, but was positive for thc and cocaine so surgery was cancelled. She denies ever using cocaine, readily admits to thc. She no longer wants BTL- states she has a lot going on right now and has changed her mind. She does smoke and has a h/o PE. She was placed on Lovenox therapy x 6wks pp, so has been off for a couple of weeks. Discussed progestin only methods, she desires depo- has been on it in the past and did well w/ it. EPDS 15 at pp visit, 13 today. No h/o depression/anxiety, states she feels like she is doing better since last visit, has just been down d/t giving baby up for adoption and pregnancy being unplanned. Denies SI/HI/II.  Bled x ~4wks pp, stopped for a little while, now just very irregular- had h/o heavy periods prior to being placed on depo in past. Denies abnormal d/c, itching/irritation, odor. Last pap: unknown.   Pertinent History Reviewed:  Medical & Surgical Hx:   Past Medical History  Diagnosis Date  . Anxiety   . 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/11/2012  . PE (pulmonary embolism) 2013   Past Surgical History  Procedure Laterality Date  . Cesarean section      x2  . Cesarean section N/A 02/14/2014    Procedure: CESAREAN SECTION;  Surgeon: Adam Phenix, MD;  Location: [facility];  Service: Obstetrics;  Laterality: N/A;  . Cholecystectomy  2005   Medications: Reviewed & Updated - see associated section Social History: Reviewed -  reports that she has been smoking Cigarettes.  She has a 4.5  pack-year smoking history. She has never used smokeless tobacco.  Objective Findings:  Vitals: BP 104/64  Ht 5\' 5"  (1.651 m)  Wt 209 lb (94.802 kg)  BMI 34.78 kg/m2  Breastfeeding? No  Physical Examination: General appearance - alert, well appearing, and in no distress Pelvic - normal external genitalia, cx w/o polyps, small amount dark red blood in vault- malodorous  Results for orders placed in visit on 04/10/14 (from the past 24 hour(s))  POCT WET PREP (WET MOUNT)   Collection Time    04/10/14  3:56 PM      Result Value Ref Range   Source Wet Prep POC vaginal     WBC, Wet Prep HPF POC none     Bacteria Wet Prep HPF POC none     BACTERIA WET PREP MORPHOLOGY POC       Clue Cells Wet Prep HPF POC Moderate     Clue Cells Wet Prep Whiff POC Positive Whiff     Yeast Wet Prep HPF POC None     KOH Wet Prep POC       Trichomonas Wet Prep HPF POC none    POCT URINE PREGNANCY   Collection Time    04/10/14  4:11 PM      Result Value Ref Range   Preg Test, Ur Negative       Assessment &  Plan:  A:   8wks s/p RLTCS  Contraception counseling  H/O PE, has completed pp Lovenox therapy  BV  Irregular periods  Smoker P:  Rx depo w/ 3RF, come in am for 1st injection  Rx flagyl for BV  Urine for gc/ct   F/U am for 1st depo, then 4wks for pap & physical  Advised smoking cessation   Marge DuncansBooker, Thadd Apuzzo Randall CNM, [facility]NP-BC 04/10/2014 5:02 PM

## 2014-04-10 NOTE — Patient Instructions (Signed)
Bacterial Vaginosis Bacterial vaginosis is a vaginal infection that occurs when the normal balance of bacteria in the vagina is disrupted. It results from an overgrowth of certain bacteria. This is the most common vaginal infection in women of childbearing age. Treatment is important to prevent complications, especially in pregnant women, as it can cause a premature delivery. CAUSES  Bacterial vaginosis is caused by an increase in harmful bacteria that are normally present in smaller amounts in the vagina. Several different kinds of bacteria can cause bacterial vaginosis. However, the reason that the condition develops is not fully understood. RISK FACTORS Certain activities or behaviors can put you at an increased risk of developing bacterial vaginosis, including:  Having a new sex partner or multiple sex partners.  Douching.  Using an intrauterine device (IUD) for contraception. Women do not get bacterial vaginosis from toilet seats, bedding, swimming pools, or contact with objects around them. SIGNS AND SYMPTOMS  Some women with bacterial vaginosis have no signs or symptoms. Common symptoms include:  Grey vaginal discharge.  A fishlike odor with discharge, especially after sexual intercourse.  Itching or burning of the vagina and vulva.  Burning or pain with urination. DIAGNOSIS  Your health care provider will take a medical history and examine the vagina for signs of bacterial vaginosis. A sample of vaginal fluid may be taken. Your health care provider will look at this sample under a microscope to check for bacteria and abnormal cells. A vaginal pH test may also be done.  TREATMENT  Bacterial vaginosis may be treated with antibiotic medicines. These may be given in the form of a pill or a vaginal cream. A second round of antibiotics may be prescribed if the condition comes back after treatment.  HOME CARE INSTRUCTIONS   Only take over-the-counter or prescription medicines as  directed by your health care provider.  If antibiotic medicine was prescribed, take it as directed. Make sure you finish it even if you start to feel better.  Do not have sex until treatment is completed.  Tell all sexual partners that you have a vaginal infection. They should see their health care provider and be treated if they have problems, such as a mild rash or itching.  Practice safe sex by using condoms and only having one sex partner. SEEK MEDICAL CARE IF:   Your symptoms are not improving after 3 days of treatment.  You have increased discharge or pain.  You have a fever. MAKE SURE YOU:   Understand these instructions.  Will watch your condition.  Will get help right away if you are not doing well or get worse. FOR MORE INFORMATION  Centers for Disease Control and Prevention, Division of STD Prevention: SolutionApps.co.zawww.cdc.gov/std American Sexual Health Association (ASHA): www.ashastd.org  Document Released: 06/22/2005 Document Revised: 04/12/2013 Document Reviewed: 02/01/2013 [facility] Patient Information 2015 [place], [place]. This information is not intended to replace advice given to you by your health care provider. Make sure you discuss any questions you have with your health care provider.  Medroxyprogesterone injection [Contraceptive]- Depo What is this medicine? MEDROXYPROGESTERONE (me DROX ee proe JES te rone) contraceptive injections prevent pregnancy. They provide effective birth control for 3 months. Depo-subQ Provera 104 is also used for treating pain related to endometriosis. This medicine may be used for other purposes; ask your health care provider or pharmacist if you have questions. COMMON BRAND NAME(S): Depo-Provera, Depo-subQ Provera 104 What should I tell my health care provider before I take this medicine? They need to know if  you have any of these conditions: -frequently drink alcohol -asthma -blood vessel disease or a history of a blood clot in the lungs or  legs -bone disease such as osteoporosis -breast cancer -diabetes -eating disorder (anorexia nervosa or bulimia) -high blood pressure -HIV infection or AIDS -kidney disease -liver disease -mental depression -migraine -seizures (convulsions) -stroke -tobacco smoker -vaginal bleeding -an unusual or allergic reaction to medroxyprogesterone, other hormones, medicines, foods, dyes, or preservatives -pregnant or trying to get pregnant -breast-feeding How should I use this medicine? Depo-Provera Contraceptive injection is given into a muscle. Depo-subQ Provera 104 injection is given under the skin. These injections are given by a health care professional. You must not be pregnant before getting an injection. The injection is usually given during the first 5 days after the start of a menstrual period or 6 weeks after delivery of a baby. Talk to your pediatrician regarding the use of this medicine in children. Special care may be needed. These injections have been used in female children who have started having menstrual periods. Overdosage: If you think you have taken too much of this medicine contact a poison control center or emergency room at once. NOTE: This medicine is only for you. Do not share this medicine with others. What if I miss a dose? Try not to miss a dose. You must get an injection once every 3 months to maintain birth control. If you cannot keep an appointment, call and reschedule it. If you wait longer than 13 weeks between Depo-Provera contraceptive injections or longer than 14 weeks between Depo-subQ Provera 104 injections, you could get pregnant. Use another method for birth control if you miss your appointment. You may also need a pregnancy test before receiving another injection. What may interact with this medicine? Do not take this medicine with any of the following medications: -bosentan This medicine may also interact with the following  medications: -aminoglutethimide -antibiotics or medicines for infections, especially rifampin, rifabutin, rifapentine, and griseofulvin -aprepitant -barbiturate medicines such as phenobarbital or primidone -bexarotene -carbamazepine -medicines for seizures like ethotoin, felbamate, oxcarbazepine, phenytoin, topiramate -modafinil -St. John's wort This list may not describe all possible interactions. Give your health care provider a list of all the medicines, herbs, non-prescription drugs, or dietary supplements you use. Also tell them if you smoke, drink alcohol, or use illegal drugs. Some items may interact with your medicine. What should I watch for while using this medicine? This drug does not protect you against HIV infection (AIDS) or other sexually transmitted diseases. Use of this product may cause you to lose calcium from your bones. Loss of calcium may cause weak bones (osteoporosis). Only use this product for more than 2 years if other forms of birth control are not right for you. The longer you use this product for birth control the more likely you will be at risk for weak bones. Ask your health care professional how you can keep strong bones. You may have a change in bleeding pattern or irregular periods. Many females stop having periods while taking this drug. If you have received your injections on time, your chance of being pregnant is very low. If you think you may be pregnant, see your health care professional as soon as possible. Tell your health care professional if you want to get pregnant within the next year. The effect of this medicine may last a long time after you get your last injection. What side effects may I notice from receiving this medicine? Side effects that you should  report to your doctor or health care professional as soon as possible: -allergic reactions like skin rash, itching or hives, swelling of the face, lips, or tongue -breast tenderness or  discharge -breathing problems -changes in vision -depression -feeling faint or lightheaded, falls -fever -pain in the abdomen, chest, groin, or leg -problems with balance, talking, walking -unusually weak or tired -yellowing of the eyes or skin Side effects that usually do not require medical attention (report to your doctor or health care professional if they continue or are bothersome): -acne -fluid retention and swelling -headache -irregular periods, spotting, or absent periods -temporary pain, itching, or skin reaction at site where injected -weight gain This list may not describe all possible side effects. Call your doctor for medical advice about side effects. You may report side effects to FDA at 1-800-FDA-1088. Where should I keep my medicine? This does not apply. The injection will be given to you by a health care professional. NOTE: This sheet is a summary. It may not cover all possible information. If you have questions about this medicine, talk to your doctor, pharmacist, or health care provider.  2015, Elsevier/Gold Standard. (2008-07-13 18:37:56)

## 2014-04-11 ENCOUNTER — Encounter: Payer: Self-pay | Admitting: Adult Health

## 2014-04-11 ENCOUNTER — Ambulatory Visit (INDEPENDENT_AMBULATORY_CARE_PROVIDER_SITE_OTHER): Payer: Medicaid Other | Admitting: Adult Health

## 2014-04-11 DIAGNOSIS — Z3042 Encounter for surveillance of injectable contraceptive: Secondary | ICD-10-CM

## 2014-04-11 DIAGNOSIS — Z3202 Encounter for pregnancy test, result negative: Secondary | ICD-10-CM

## 2014-04-11 LAB — GC/CHLAMYDIA PROBE AMP
CT PROBE, AMP APTIMA: NEGATIVE
GC PROBE AMP APTIMA: NEGATIVE

## 2014-04-11 LAB — POCT URINE PREGNANCY: PREG TEST UR: NEGATIVE

## 2014-04-11 MED ORDER — MEDROXYPROGESTERONE ACETATE 150 MG/ML IM SUSP
150.0000 mg | Freq: Once | INTRAMUSCULAR | Status: AC
  Administered 2014-04-11: 150 mg via INTRAMUSCULAR

## 2014-05-07 ENCOUNTER — Encounter: Payer: Self-pay | Admitting: Adult Health

## 2014-05-08 ENCOUNTER — Other Ambulatory Visit: Payer: Medicaid Other | Admitting: Women's Health

## 2014-05-15 ENCOUNTER — Encounter: Payer: Self-pay | Admitting: Women's Health

## 2014-05-15 ENCOUNTER — Ambulatory Visit (INDEPENDENT_AMBULATORY_CARE_PROVIDER_SITE_OTHER): Payer: Medicaid Other | Admitting: Women's Health

## 2014-05-15 ENCOUNTER — Other Ambulatory Visit (HOSPITAL_COMMUNITY)
Admission: RE | Admit: 2014-05-15 | Discharge: 2014-05-15 | Disposition: A | Discharge status: Home / Self Care | Payer: Medicaid Other | Source: Ambulatory Visit | Attending: Obstetrics & Gynecology | Admitting: Obstetrics & Gynecology

## 2014-05-15 DIAGNOSIS — Z113 Encounter for screening for infections with a predominantly sexual mode of transmission: Secondary | ICD-10-CM | POA: Diagnosis present

## 2014-05-15 DIAGNOSIS — N921 Excessive and frequent menstruation with irregular cycle: Secondary | ICD-10-CM

## 2014-05-15 DIAGNOSIS — Z0000 Encounter for general adult medical examination without abnormal findings: Secondary | ICD-10-CM

## 2014-05-15 DIAGNOSIS — Z124 Encounter for screening for malignant neoplasm of cervix: Secondary | ICD-10-CM

## 2014-05-15 DIAGNOSIS — Z01411 Encounter for gynecological examination (general) (routine) with abnormal findings: Secondary | ICD-10-CM | POA: Insufficient documentation

## 2014-05-15 DIAGNOSIS — R102 Pelvic and perineal pain: Secondary | ICD-10-CM

## 2014-05-15 DIAGNOSIS — N898 Other specified noninflammatory disorders of vagina: Secondary | ICD-10-CM

## 2014-05-15 DIAGNOSIS — Z1151 Encounter for screening for human papillomavirus (HPV): Secondary | ICD-10-CM | POA: Diagnosis present

## 2014-05-15 DIAGNOSIS — Z01419 Encounter for gynecological examination (general) (routine) without abnormal findings: Secondary | ICD-10-CM

## 2014-05-15 DIAGNOSIS — Z720 Tobacco use: Secondary | ICD-10-CM

## 2014-05-15 LAB — POCT WET PREP (WET MOUNT): CLUE CELLS WET PREP WHIFF POC: POSITIVE

## 2014-05-15 MED ORDER — METRONIDAZOLE 500 MG PO TABS: 500.0000 mg | ORAL_TABLET | Freq: Two times a day (BID) | ORAL | Status: DC

## 2014-05-15 NOTE — Progress Notes (Signed)
Patient ID: Holly Hodge, female   DOB: 01/08/1986, 28 y.o.   MRN: [id number] Subjective:   Holly Hodge is a 28 y.o. [id number] African American female here for a routine well-woman exam.  Received 1st depo on 04/11/14 after having a RLTCS on 02/14/14- gave baby up for adoption.  Current complaints: Lt lower intermittent achy/crampy pelvic pain and constant vb x 3-4wks- more than spotting- enough to wear tampon/pad, but not heavy. Denies odor/itching/irritation, uti s/s, problems w/ bowels.  PCP: [facility]- goes to see them tomorrow for routine exam, states she hasn't been back since tx for PE        Social History: Sexual: heterosexual Marital Status: single Living situation: w/ father and her kids Occupation: Surveyor, [employer], PCP: personal care provider Tobacco/alcohol: 1ppd- interested in quitting, etoh: occ Illicit drugs: THC  The following portions of the patient's history were reviewed and updated as appropriate: allergies, current medications, past family history, past medical history, past social history, past surgical history and problem list.  Past Medical History Past Medical History  Diagnosis Date  . Anxiety   . 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/11/2012  . PE (pulmonary embolism) 2013    Past Surgical History Past Surgical History  Procedure Laterality Date  . Cesarean section      x2  . Cesarean section N/A 02/14/2014    Procedure: CESAREAN SECTION;  Surgeon: Adam PhenixJames G Arnold, MD;  Location: [facility];  Service: Obstetrics;  Laterality: N/A;  . Cholecystectomy  2005    Gynecologic History [id number]  No LMP recorded. Contraception: Depo-Provera injections Last Pap: unsure. Results were: normal Last mammogram: never. Results were: n/a Last TCS: never  Obstetric History OB History  Gravida Para Term Preterm AB SAB TAB Ectopic Multiple Living  3 3 3       3     # Outcome Date GA Lbr Len/2nd Weight Sex Delivery Anes PTL Lv  3 Term 02/14/14 604w3d  6 lb 11.8 oz  (3.055 kg) F CS-LTranv Spinal  Y  2 Term           1 Term               Current Medications Current Outpatient Prescriptions on File Prior to Visit  Medication Sig Dispense Refill  . medroxyPROGESTERone (DEPO-PROVERA) 150 MG/ML injection Inject 1 mL (150 mg total) into the muscle every 3 (three) months. 1 mL 3  . HYDROcodone-acetaminophen (NORCO) 10-325 MG per tablet Take 1 tablet by mouth every 6 (six) hours as needed.     No current facility-administered medications on file prior to visit.    Review of Systems Patient denies any headaches, blurred vision, shortness of breath, chest pain, abdominal pain, problems with bowel movements, urination, or intercourse.  Objective:  There were no vitals taken for this visit. Physical Exam  General:  Well developed, well nourished, no acute distress. She is alert and oriented x3. Skin:  Warm and dry Neck:  Midline trachea, no thyromegaly or nodules Cardiovascular: Regular rate and rhythm, no murmur heard Lungs:  Effort normal, all lung fields clear to auscultation bilaterally Breasts:  No dominant palpable mass, retraction, or nipple discharge Abdomen:  Soft, non tender, no hepatosplenomegaly or masses Pelvic:  External genitalia is normal in appearance.  The vagina is normal in appearance. Small amount menstrual type blood, malodorous. The cervix is bulbous, no CMT.  Thin prep pap is done w/ reflex HR HPV cotesting. Uterus is felt to be normal size, shape, and contour.  No adnexal masses or tenderness noted. Extremities:  No swelling or varicosities noted Psych:  She has a normal mood and affect  Results for orders placed or performed in visit on 05/15/14 (from the past 24 hour(s))  POCT Wet Prep Mellody Drown([place]et [place])     Status: Abnormal   Collection Time: 05/15/14  2:12 PM  Result Value Ref Range   Source Wet Prep POC vaginal    WBC, Wet Prep HPF POC none    Bacteria Wet Prep HPF POC none    BACTERIA WET PREP MORPHOLOGY POC     Clue Cells Wet  Prep HPF POC Many    Clue Cells Wet Prep Whiff POC Positive Whiff    Yeast Wet Prep HPF POC None    KOH Wet Prep POC     Trichomonas Wet Prep HPF POC none      Assessment:   Healthy well-woman exam BV Lt lower pelvic pain VB x 3-4wks Smoker  Plan:  Rx flagyl 500mg  BID x 7d for BV, no etoh F/U 1wk for pelvic u/s and f/u w/ me, or sooner if needed Smokes 1/day, advised cessation, discussed risks to children and to herself. Offered Quitline[place], accepted, referral sent.    Keep appt w/ [facility] tomorrow, request annual labwork  Mammogram @ 28yo or sooner if problems Colonoscopy @28yo  or sooner if problems  Marge DuncansBooker, Kynlea Blackston Randall CNM, [facility]BC 05/15/2014 2:07 PM

## 2014-05-15 NOTE — Patient Instructions (Signed)
Bacterial Vaginosis Bacterial vaginosis is a vaginal infection that occurs when the normal balance of bacteria in the vagina is disrupted. It results from an overgrowth of certain bacteria. This is the most common vaginal infection in women of childbearing age. Treatment is important to prevent complications, especially in pregnant women, as it can cause a premature delivery. CAUSES  Bacterial vaginosis is caused by an increase in harmful bacteria that are normally present in smaller amounts in the vagina. Several different kinds of bacteria can cause bacterial vaginosis. However, the reason that the condition develops is not fully understood. RISK FACTORS Certain activities or behaviors can put you at an increased risk of developing bacterial vaginosis, including:  Having a new sex partner or multiple sex partners.  Douching.  Using an intrauterine device (IUD) for contraception. Women do not get bacterial vaginosis from toilet seats, bedding, swimming pools, or contact with objects around them. SIGNS AND SYMPTOMS  Some women with bacterial vaginosis have no signs or symptoms. Common symptoms include:  Grey vaginal discharge.  A fishlike odor with discharge, especially after sexual intercourse.  Itching or burning of the vagina and vulva.  Burning or pain with urination. DIAGNOSIS  Your health care provider will take a medical history and examine the vagina for signs of bacterial vaginosis. A sample of vaginal fluid may be taken. Your health care provider will look at this sample under a microscope to check for bacteria and abnormal cells. A vaginal pH test may also be done.  TREATMENT  Bacterial vaginosis may be treated with antibiotic medicines. These may be given in the form of a pill or a vaginal cream. A second round of antibiotics may be prescribed if the condition comes back after treatment.  HOME CARE INSTRUCTIONS   Only take over-the-counter or prescription medicines as  directed by your health care provider.  If antibiotic medicine was prescribed, take it as directed. Make sure you finish it even if you start to feel better.  Do not have sex until treatment is completed.  Tell all sexual partners that you have a vaginal infection. They should see their health care provider and be treated if they have problems, such as a mild rash or itching.  Practice safe sex by using condoms and only having one sex partner. SEEK MEDICAL CARE IF:   Your symptoms are not improving after 3 days of treatment.  You have increased discharge or pain.  You have a fever. MAKE SURE YOU:   Understand these instructions.  Will watch your condition.  Will get help right away if you are not doing well or get worse. FOR MORE INFORMATION  Centers for Disease Control and Prevention, Division of STD Prevention: SolutionApps.co.za American Sexual Health Association (ASHA): www.ashastd.org  Document Released: 06/22/2005 Document Revised: 04/12/2013 Document Reviewed: 02/01/2013 [facility] Patient Information 2015 [place], [place]. This information is not intended to replace advice given to you by your health care provider. Make sure you discuss any questions you have with your health care provider.  Smoking Cessation, Tips for Success If you are ready to quit smoking, congratulations! You have chosen to help yourself be healthier. Cigarettes bring nicotine, tar, carbon monoxide, and other irritants into your body. Your lungs, heart, and blood vessels will be able to work better without these poisons. There are many different ways to quit smoking. Nicotine gum, nicotine patches, a nicotine inhaler, or nicotine nasal spray can help with physical craving. Hypnosis, support groups, and medicines help break the habit of smoking. WHAT  THINGS CAN I DO TO MAKE QUITTING EASIER?  Here are some tips to help you quit for good:  Pick a date when you will quit smoking completely. Tell all of your  friends and family about your plan to quit on that date.  Do not try to slowly cut down on the number of cigarettes you are smoking. Pick a quit date and quit smoking completely starting on that day.  Throw away all cigarettes.   Clean and remove all ashtrays from your home, work, and car.  On a card, write down your reasons for quitting. Carry the card with you and read it when you get the urge to smoke.  Cleanse your body of nicotine. Drink enough water and fluids to keep your urine clear or pale yellow. Do this after quitting to flush the nicotine from your body.  Learn to predict your moods. Do not let a bad situation be your excuse to have a cigarette. Some situations in your life might tempt you into wanting a cigarette.  Never have "just one" cigarette. It leads to wanting another and another. Remind yourself of your decision to quit.  Change habits associated with smoking. If you smoked while driving or when feeling stressed, try other activities to replace smoking. Stand up when drinking your coffee. Brush your teeth after eating. Sit in a different chair when you read the paper. Avoid alcohol while trying to quit, and try to drink fewer caffeinated beverages. Alcohol and caffeine may urge you to smoke.  Avoid foods and drinks that can trigger a desire to smoke, such as sugary or spicy foods and alcohol.  Ask people who smoke not to smoke around you.  Have something planned to do right after eating or having a cup of coffee. For example, plan to take a walk or exercise.  Try a relaxation exercise to calm you down and decrease your stress. Remember, you may be tense and nervous for the first 2 weeks after you quit, but this will pass.  Find new activities to keep your hands busy. Play with a pen, coin, or rubber band. Doodle or draw things on paper.  Brush your teeth right after eating. This will help cut down on the craving for the taste of tobacco after meals. You can also try  mouthwash.   Use oral substitutes in place of cigarettes. Try using lemon drops, carrots, cinnamon sticks, or chewing gum. Keep them handy so they are available when you have the urge to smoke.  When you have the urge to smoke, try deep breathing.  Designate your home as a nonsmoking area.  If you are a heavy smoker, ask your health care provider about a prescription for nicotine chewing gum. It can ease your withdrawal from nicotine.  Reward yourself. Set aside the cigarette money you save and buy yourself something nice.  Look for support from others. Join a support group or smoking cessation program. Ask someone at home or at work to help you with your plan to quit smoking.  Always ask yourself, "Do I need this cigarette or is this just a reflex?" Tell yourself, "Today, I choose not to smoke," or "I do not want to smoke." You are reminding yourself of your decision to quit.  Do not replace cigarette smoking with electronic cigarettes (commonly called e-cigarettes). The safety of e-cigarettes is unknown, and some may contain harmful chemicals.  If you relapse, do not give up! Plan ahead and think about what you will do  the next time you get the urge to smoke. HOW WILL I FEEL WHEN I QUIT SMOKING? You may have symptoms of withdrawal because your body is used to nicotine (the addictive substance in cigarettes). You may crave cigarettes, be irritable, feel very hungry, cough often, get headaches, or have difficulty concentrating. The withdrawal symptoms are only temporary. They are strongest when you first quit but will go away within 10-14 days. When withdrawal symptoms occur, stay in control. Think about your reasons for quitting. Remind yourself that these are signs that your body is healing and getting used to being without cigarettes. Remember that withdrawal symptoms are easier to treat than the major diseases that smoking can cause.  Even after the withdrawal is over, expect periodic urges  to smoke. However, these cravings are generally short lived and will go away whether you smoke or not. Do not smoke! WHAT RESOURCES ARE AVAILABLE TO HELP ME QUIT SMOKING? Your health care provider can direct you to community resources or hospitals for support, which may include:  Group support.  Education.  Hypnosis.  Therapy. Document Released: 03/20/2004 Document Revised: 11/06/2013 Document Reviewed: 12/08/2012 [facility] Patient Information 2015 [place], [place]. This information is not intended to replace advice given to you by your health care provider. Make sure you discuss any questions you have with your health care provider.

## 2014-05-17 LAB — CYTOLOGY - PAP

## 2014-05-21 ENCOUNTER — Encounter: Payer: Self-pay | Admitting: Women's Health

## 2014-05-21 DIAGNOSIS — R87619 Unspecified abnormal cytological findings in specimens from cervix uteri: Secondary | ICD-10-CM | POA: Insufficient documentation

## 2014-05-23 ENCOUNTER — Encounter: Payer: Self-pay | Admitting: Women's Health

## 2014-05-23 ENCOUNTER — Ambulatory Visit (INDEPENDENT_AMBULATORY_CARE_PROVIDER_SITE_OTHER): Payer: Medicaid Other

## 2014-05-23 ENCOUNTER — Other Ambulatory Visit: Payer: Self-pay | Admitting: Women's Health

## 2014-05-23 ENCOUNTER — Ambulatory Visit (INDEPENDENT_AMBULATORY_CARE_PROVIDER_SITE_OTHER): Payer: Medicaid Other | Admitting: Women's Health

## 2014-05-23 VITALS — BP 124/70 | Ht 65.0 in | Wt 212.0 lb

## 2014-05-23 DIAGNOSIS — N921 Excessive and frequent menstruation with irregular cycle: Secondary | ICD-10-CM

## 2014-05-23 DIAGNOSIS — R102 Pelvic and perineal pain: Secondary | ICD-10-CM

## 2014-05-23 DIAGNOSIS — K5900 Constipation, unspecified: Secondary | ICD-10-CM

## 2014-05-23 NOTE — Progress Notes (Signed)
Patient ID: Holly Baneerri L Newsham, female   DOB: 11/03/1985, 28 y.o.   MRN: [id number]   [facility]  Patient name: Holly Hodge MRN [id number]  Date of birth: 10/20/1985  CC & HPI:  Holly Baneerri L Shaneyfelt is a 28 y.o. African American female presenting today for f/u after pelvic u/s today for Lt lower pelvic pain and irregular vb. Has some occ constipation- notices pain is a little worse at these times, then gets better w/ bm. Pap 11/10: ASCUS w/ neg HRHPV.   Pertinent History Reviewed:  Medical & Surgical Hx:   Past Medical History  Diagnosis Date  . Anxiety   . 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/11/2012  . PE (pulmonary embolism) 2013   Past Surgical History  Procedure Laterality Date  . Cesarean section      x2  . Cesarean section N/A 02/14/2014    Procedure: CESAREAN SECTION;  Surgeon: Adam PhenixJames G Arnold, MD;  Location: [facility];  Service: Obstetrics;  Laterality: N/A;  . Cholecystectomy  2005   Medications: Reviewed & Updated - see associated section Social History: Reviewed -  reports that she has been smoking Cigarettes.  She has a 3 pack-year smoking history. She has never used smokeless tobacco.  Objective Findings:  Vitals: BP 124/70 mmHg  Ht 5\' 5"  (1.651 m)  Wt 212 lb (96.163 kg)  BMI 35.28 kg/m2  Breastfeeding? No  Physical Examination: General appearance - alert, well appearing, and in no distress  Today's pelvic u/s:  Uterus 8.1 x 5.6 x 4.3 cm, retroverted  Endometrium 3.7 mm, symmetrical,  Right ovary 3.1 x 2.6 x 1.9 cm,  Left ovary 2.6 x 1.8 x 1.6 cm,  No free fluid or adnexal masses noted within the pelvis Technician Comments:Retroverted uterus, Endom-3.397mm, bilateral adnexa/ovaries appear WNL, no free fluid or adnexal masses noted within the pelvis. Chari ManningMcBride, Tasha 05/23/2014 3:36 PM  Assessment & Plan:  A:   Lt lower abd/pelvic pain w/ normal pelvic u/s  Occ constipation  Irr vb  ASCUS w/ neg HRHPV  pap P:  Gave printed info on constipation prevention/relief measures To call if pain worsens/not improving   F/U [age]yr for physical, pap in 6732yrs per ASCCP guidelines   Marge DuncansBooker, Calina Patrie Randall CNM, [facility]NP-BC 05/23/2014 4:09 PM

## 2014-05-23 NOTE — Patient Instructions (Signed)
Call if pain not improving or getting worse  Constipation  Drink plenty of fluid, preferably water, throughout the day  Eat foods high in fiber such as fruits, vegetables, and grains  Exercise, such as walking, is a good way to keep your bowels regular  Drink warm fluids, especially warm prune juice, or decaf coffee  Eat a 1/2 cup of real oatmeal (not instant), 1/2 cup applesauce, and 1/2-1 cup warm prune juice every day  If needed, you may take Colace (docusate sodium) stool softener once or twice a day to help keep the stool soft. If you are pregnant, wait until you are out of your first trimester (12-14 weeks of pregnancy)  If you still are having problems with constipation, you may take Miralax once daily as needed to help keep your bowels regular.  If you are pregnant, wait until you are out of your first trimester (12-14 weeks of pregnancy)

## 2014-06-12 ENCOUNTER — Encounter: Payer: Self-pay | Admitting: Adult Health

## 2014-06-12 ENCOUNTER — Ambulatory Visit (INDEPENDENT_AMBULATORY_CARE_PROVIDER_SITE_OTHER): Payer: Medicaid Other | Admitting: Adult Health

## 2014-06-12 VITALS — BP 136/70 | Ht 65.0 in | Wt 216.0 lb

## 2014-06-12 DIAGNOSIS — B9689 Other specified bacterial agents as the cause of diseases classified elsewhere: Secondary | ICD-10-CM

## 2014-06-12 DIAGNOSIS — A499 Bacterial infection, unspecified: Secondary | ICD-10-CM

## 2014-06-12 DIAGNOSIS — N921 Excessive and frequent menstruation with irregular cycle: Secondary | ICD-10-CM

## 2014-06-12 DIAGNOSIS — N760 Acute vaginitis: Secondary | ICD-10-CM

## 2014-06-12 HISTORY — DX: Excessive and frequent menstruation with irregular cycle: N92.1

## 2014-06-12 MED ORDER — METRONIDAZOLE 500 MG PO TABS: 500.0000 mg | ORAL_TABLET | Freq: Two times a day (BID) | ORAL | Status: DC

## 2014-06-12 NOTE — Patient Instructions (Signed)
Bacterial Vaginosis Bacterial vaginosis is a vaginal infection that occurs when the normal balance of bacteria in the vagina is disrupted. It results from an overgrowth of certain bacteria. This is the most common vaginal infection in women of childbearing age. Treatment is important to prevent complications, especially in pregnant women, as it can cause a premature delivery. CAUSES  Bacterial vaginosis is caused by an increase in harmful bacteria that are normally present in smaller amounts in the vagina. Several different kinds of bacteria can cause bacterial vaginosis. However, the reason that the condition develops is not fully understood. RISK FACTORS Certain activities or behaviors can put you at an increased risk of developing bacterial vaginosis, including:  Having a new sex partner or multiple sex partners.  Douching.  Using an intrauterine device (IUD) for contraception. Women do not get bacterial vaginosis from toilet seats, bedding, swimming pools, or contact with objects around them. SIGNS AND SYMPTOMS  Some women with bacterial vaginosis have no signs or symptoms. Common symptoms include:  Grey vaginal discharge.  A fishlike odor with discharge, especially after sexual intercourse.  Itching or burning of the vagina and vulva.  Burning or pain with urination. DIAGNOSIS  Your health care provider will take a medical history and examine the vagina for signs of bacterial vaginosis. A sample of vaginal fluid may be taken. Your health care provider will look at this sample under a microscope to check for bacteria and abnormal cells. A vaginal pH test may also be done.  TREATMENT  Bacterial vaginosis may be treated with antibiotic medicines. These may be given in the form of a pill or a vaginal cream. A second round of antibiotics may be prescribed if the condition comes back after treatment.  HOME CARE INSTRUCTIONS   Only take over-the-counter or prescription medicines as  directed by your health care provider.  If antibiotic medicine was prescribed, take it as directed. Make sure you finish it even if you start to feel better.  Do not have sex until treatment is completed.  Tell all sexual partners that you have a vaginal infection. They should see their health care provider and be treated if they have problems, such as a mild rash or itching.  Practice safe sex by using condoms and only having one sex partner. SEEK MEDICAL CARE IF:   Your symptoms are not improving after 3 days of treatment.  You have increased discharge or pain.  You have a fever. MAKE SURE YOU:   Understand these instructions.  Will watch your condition.  Will get help right away if you are not doing well or get worse. FOR MORE INFORMATION  Centers for Disease Control and Prevention, Division of STD Prevention: SolutionApps.co.zawww.cdc.gov/std American Sexual Health Association (ASHA): www.ashastd.org  Document Released: 06/22/2005 Document Revised: 04/12/2013 Document Reviewed: 02/01/2013 [facility] Patient Information 2015 [place], [place]. This information is not intended to replace advice given to you by your health care provider. Make sure you discuss any questions you have with your health care provider. Take flagyl no alcohol  Follow up prn

## 2014-06-12 NOTE — Progress Notes (Signed)
Subjective:     Patient ID: Holly Hodge, female   DOB: 01/07/1986, 28 y.o.   MRN: [id number]  HPI Holly Hodge is a [age] year old black female in complaining of irregular bleeding with depo and vaginal irritation with slight odor.No new partners.Depo due end of December.  Review of Systems See HPI Reviewed past medical,surgical, social and family history. Reviewed medications and allergies.     Objective:   Physical Exam BP 136/70 mmHg  Ht 5\' 5"  (1.651 m)  Wt 216 lb (97.977 kg)  BMI 35.94 kg/m2  LMP 05/29/2014  Breastfeeding? No Skin warm and dry.Pelvic: external genitalia is normal in appearance, vagina: brownish discharge with odor, cervix:smooth and bulbous, uterus: normal size, shape and contour, non tender, no masses felt, adnexa: no masses or tenderness noted. Wet prep: + for clue cells and +WBCs. GC/CHL obtained.     Assessment:     Irregular bleeding with depo BV    Plan:    GC/CHL sent Rx flagyl 500 mg 1 bid x 7 days, no alcohol, review handout on BV   Follow up prn

## 2014-06-13 LAB — GC/CHLAMYDIA PROBE AMP
CT Probe RNA: NEGATIVE
GC Probe RNA: NEGATIVE

## 2014-06-26 ENCOUNTER — Telehealth: Payer: Self-pay | Admitting: *Deleted

## 2014-06-26 MED ORDER — MEGESTROL ACETATE 40 MG PO TABS: ORAL_TABLET | ORAL | Status: DC

## 2014-06-26 NOTE — Telephone Encounter (Signed)
I called pt but she wants to talk to you. Would not give me any info. JSY

## 2014-06-26 NOTE — Telephone Encounter (Signed)
Complains of bleeding with depo will try megace

## 2014-07-04 ENCOUNTER — Ambulatory Visit (INDEPENDENT_AMBULATORY_CARE_PROVIDER_SITE_OTHER): Payer: Medicaid Other | Admitting: Adult Health

## 2014-07-04 DIAGNOSIS — Z3202 Encounter for pregnancy test, result negative: Secondary | ICD-10-CM

## 2014-07-04 DIAGNOSIS — Z3042 Encounter for surveillance of injectable contraceptive: Secondary | ICD-10-CM

## 2014-07-04 LAB — POCT URINE PREGNANCY: Preg Test, Ur: NEGATIVE

## 2014-07-04 MED ORDER — MEDROXYPROGESTERONE ACETATE 150 MG/ML IM SUSP
150.0000 mg | Freq: Once | INTRAMUSCULAR | Status: AC
  Administered 2014-07-04: 150 mg via INTRAMUSCULAR

## 2014-07-10 ENCOUNTER — Emergency Department (HOSPITAL_COMMUNITY): Payer: Medicaid Other

## 2014-07-10 ENCOUNTER — Emergency Department (HOSPITAL_COMMUNITY)
Admission: EM | Admit: 2014-07-10 | Discharge: 2014-07-10 | Disposition: A | Discharge status: Home / Self Care | Payer: Medicaid Other | Attending: Emergency Medicine | Admitting: Emergency Medicine

## 2014-07-10 ENCOUNTER — Encounter (HOSPITAL_COMMUNITY): Payer: Self-pay

## 2014-07-10 DIAGNOSIS — R109 Unspecified abdominal pain: Secondary | ICD-10-CM

## 2014-07-10 DIAGNOSIS — K219 Gastro-esophageal reflux disease without esophagitis: Secondary | ICD-10-CM | POA: Diagnosis not present

## 2014-07-10 DIAGNOSIS — Z86711 Personal history of pulmonary embolism: Secondary | ICD-10-CM | POA: Diagnosis not present

## 2014-07-10 DIAGNOSIS — Z792 Long term (current) use of antibiotics: Secondary | ICD-10-CM | POA: Insufficient documentation

## 2014-07-10 DIAGNOSIS — Z720 Tobacco use: Secondary | ICD-10-CM | POA: Diagnosis not present

## 2014-07-10 DIAGNOSIS — Z3202 Encounter for pregnancy test, result negative: Secondary | ICD-10-CM | POA: Diagnosis not present

## 2014-07-10 DIAGNOSIS — N926 Irregular menstruation, unspecified: Secondary | ICD-10-CM | POA: Diagnosis not present

## 2014-07-10 DIAGNOSIS — F419 Anxiety disorder, unspecified: Secondary | ICD-10-CM | POA: Insufficient documentation

## 2014-07-10 DIAGNOSIS — Z9049 Acquired absence of other specified parts of digestive tract: Secondary | ICD-10-CM | POA: Diagnosis not present

## 2014-07-10 DIAGNOSIS — Z79899 Other long term (current) drug therapy: Secondary | ICD-10-CM | POA: Insufficient documentation

## 2014-07-10 DIAGNOSIS — K5900 Constipation, unspecified: Secondary | ICD-10-CM | POA: Insufficient documentation

## 2014-07-10 LAB — URINALYSIS, ROUTINE W REFLEX MICROSCOPIC
Bilirubin Urine: NEGATIVE
GLUCOSE, UA: NEGATIVE mg/dL
Ketones, ur: NEGATIVE mg/dL
LEUKOCYTES UA: NEGATIVE
Nitrite: NEGATIVE
Protein, ur: NEGATIVE mg/dL
Specific Gravity, Urine: 1.02 (ref 1.005–1.030)
Urobilinogen, UA: 0.2 mg/dL (ref 0.0–1.0)
pH: 5.5 (ref 5.0–8.0)

## 2014-07-10 LAB — CBC WITH DIFFERENTIAL/PLATELET
BASOS ABS: 0 10*3/uL (ref 0.0–0.1)
Basophils Relative: 1 % (ref 0–1)
EOS ABS: 0.1 10*3/uL (ref 0.0–0.7)
Eosinophils Relative: 1 % (ref 0–5)
HCT: 38.8 % (ref 36.0–46.0)
HEMOGLOBIN: 12.7 g/dL (ref 12.0–15.0)
Lymphocytes Relative: 47 % — ABNORMAL HIGH (ref 12–46)
Lymphs Abs: 3 10*3/uL (ref 0.7–4.0)
MCH: 29.4 pg (ref 26.0–34.0)
MCHC: 32.7 g/dL (ref 30.0–36.0)
MCV: 89.8 fL (ref 78.0–100.0)
MONOS PCT: 6 % (ref 3–12)
Monocytes Absolute: 0.4 10*3/uL (ref 0.1–1.0)
NEUTROS ABS: 2.9 10*3/uL (ref 1.7–7.7)
NEUTROS PCT: 45 % (ref 43–77)
Platelets: 338 10*3/uL (ref 150–400)
RBC: 4.32 MIL/uL (ref 3.87–5.11)
RDW: 14.3 % (ref 11.5–15.5)
WBC: 6.4 10*3/uL (ref 4.0–10.5)

## 2014-07-10 LAB — COMPREHENSIVE METABOLIC PANEL
ALT: 48 U/L — AB (ref 0–35)
AST: 32 U/L (ref 0–37)
Albumin: 3.7 g/dL (ref 3.5–5.2)
Alkaline Phosphatase: 62 U/L (ref 39–117)
Anion gap: 5 (ref 5–15)
BILIRUBIN TOTAL: 0.3 mg/dL (ref 0.3–1.2)
BUN: 13 mg/dL (ref 6–23)
CO2: 21 mmol/L (ref 19–32)
Calcium: 8.4 mg/dL (ref 8.4–10.5)
Chloride: 113 mEq/L — ABNORMAL HIGH (ref 96–112)
Creatinine, Ser: 1.11 mg/dL — ABNORMAL HIGH (ref 0.50–1.10)
GFR calc non Af Amer: 66 mL/min — ABNORMAL LOW (ref >90)
GFR, EST AFRICAN AMERICAN: 77 mL/min — AB (ref >90)
GLUCOSE: 99 mg/dL (ref 70–99)
Potassium: 4.2 mmol/L (ref 3.5–5.1)
SODIUM: 139 mmol/L (ref 135–145)
Total Protein: 6.7 g/dL (ref 6.0–8.3)

## 2014-07-10 LAB — LIPASE, BLOOD: Lipase: 40 U/L (ref 11–59)

## 2014-07-10 LAB — PREGNANCY, URINE: Preg Test, Ur: NEGATIVE

## 2014-07-10 LAB — URINE MICROSCOPIC-ADD ON

## 2014-07-10 MED ORDER — FAMOTIDINE 20 MG PO TABS: 20.0000 mg | ORAL_TABLET | Freq: Two times a day (BID) | ORAL | Status: DC

## 2014-07-10 MED ORDER — FAMOTIDINE 20 MG PO TABS
20.0000 mg | ORAL_TABLET | Freq: Once | ORAL | Status: AC
  Administered 2014-07-10: 20 mg via ORAL
  Filled 2014-07-10: qty 1

## 2014-07-10 MED ORDER — ALUM & MAG HYDROXIDE-SIMETH 200-200-20 MG/5ML PO SUSP
15.0000 mL | Freq: Once | ORAL | Status: AC
  Administered 2014-07-10: 15 mL via ORAL
  Filled 2014-07-10: qty 30

## 2014-07-10 MED ORDER — ONDANSETRON HCL 4 MG PO TABS
4.0000 mg | ORAL_TABLET | Freq: Once | ORAL | Status: AC
  Administered 2014-07-10: 4 mg via ORAL
  Filled 2014-07-10: qty 1

## 2014-07-10 MED ORDER — SODIUM CHLORIDE 0.9 % IJ SOLN
INTRAMUSCULAR | Status: AC
  Filled 2014-07-10: qty 45

## 2014-07-10 MED ORDER — IOHEXOL 300 MG/ML  SOLN
100.0000 mL | Freq: Once | INTRAMUSCULAR | Status: AC | PRN
  Administered 2014-07-10: 100 mL via INTRAVENOUS

## 2014-07-10 MED ORDER — IOHEXOL 300 MG/ML  SOLN
50.0000 mL | Freq: Once | INTRAMUSCULAR | Status: AC | PRN
  Administered 2014-07-10: 50 mL via ORAL

## 2014-07-10 NOTE — ED Provider Notes (Signed)
CSN: [id number]     Arrival date & time 07/10/14  0654 History   First MD Initiated Contact with Patient 07/10/14 [phone]     Chief Complaint  Patient presents with  . abd bloating      (Consider location/radiation/quality/duration/timing/severity/associated sxs/prior Treatment) HPI Comments: Pt c/o belching and bloating sensation with a bad odor. Onset yesterday. Nofever, diarrhea, but some constipation. Pt had cholecstectomy, otherwise no abd surg.  No recent injury. Last BM was yesterday, but hard to pass. Last menstrual cycle 2 weeks ago. No hx of trauma. No hx of obstruction, ulcer, and no colitis. Nothing makes the problem worse. Soda allows her to belch, but it has a strong odor, accompanied by discomfort.  The history is provided by the patient.    Past Medical History  Diagnosis Date  . Anxiety   . 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/11/2012  . PE (pulmonary embolism) 2013  . Irregular intermenstrual bleeding 06/12/2014   Past Surgical History  Procedure Laterality Date  . Cesarean section      x2  . Cesarean section N/A 02/14/2014    Procedure: CESAREAN SECTION;  Surgeon: Adam PhenixJames G Arnold, MD;  Location: [facility];  Service: Obstetrics;  Laterality: N/A;  . Cholecystectomy  2005  . Wisdom tooth extraction     Family History  Problem Relation Age of Onset  . Hypertension Mother   . Hypertension Father   . Stroke Father   . Cancer Other     runs on both sides of family  . Cancer Paternal Grandmother    History  Substance Use Topics  . Smoking status: Current Every Day Smoker -- 0.50 packs/day for 10 years    Types: Cigarettes  . Smokeless tobacco: Never Used  . Alcohol Use: Yes     Comment: occasionally   OB History    Gravida Para Term Preterm AB TAB SAB Ectopic Multiple Living   3 3 3       3      Review of Systems  Constitutional: Negative for activity change.       All ROS Neg except as noted in HPI  HENT: Negative for nosebleeds.   Eyes: Negative for photophobia  and discharge.  Respiratory: Negative for cough, shortness of breath and wheezing.   Cardiovascular: Negative for chest pain and palpitations.  Gastrointestinal: Positive for constipation. Negative for nausea, vomiting, abdominal pain, diarrhea and blood in stool.  Genitourinary: Positive for menstrual problem. Negative for dysuria, frequency and hematuria.  Musculoskeletal: Negative for back pain, arthralgias and neck pain.  Skin: Negative.   Neurological: Negative for dizziness, seizures and speech difficulty.  Psychiatric/Behavioral: Negative for hallucinations and confusion. The patient is nervous/anxious.       Allergies  Review of patient's allergies indicates no known allergies.  Home Medications   Prior to Admission medications   Medication Sig Start Date End Date Taking? Authorizing Provider  ibuprofen (ADVIL,MOTRIN) 200 MG tablet Take 200 mg by mouth every 4 (four) hours as needed.    Historical Provider, MD  medroxyPROGESTERone (DEPO-PROVERA) 150 MG/ML injection Inject 1 mL (150 mg total) into the muscle every 3 (three) months. 04/10/14   Marge DuncansKimberly Randall Booker, CNM  megestrol (MEGACE) 40 MG tablet Take 3 x 5 days then 2 x 5 days then 1 daily 06/26/14   Adline PotterJennifer A Griffin, NP  metroNIDAZOLE (FLAGYL) 500 MG tablet Take 1 tablet (500 mg total) by mouth 2 (two) times daily. 06/12/14   Adline PotterJennifer A Griffin, NP   BP 120/84 mmHg  Pulse 81  Temp(Src) 98.4 F (36.9 C) (Oral)  Resp 20  Ht  (1.651 m)  Wt 221 lb (100.245 kg)  BMI 36.78 kg/m2  SpO2 100%  LMP 05/15/2014 Physical Exam  Constitutional: She is oriented to person, place, and time. She appears well-developed and well-nourished.  Non-toxic appearance.  HENT:  Head: Normocephalic.  Right Ear: Tympanic membrane and external ear normal.  Left Ear: Tympanic membrane and external ear normal.  Eyes: EOM and lids are normal. Pupils are equal, round, and reactive to light.  Neck: Normal range of motion. Neck supple.  Carotid bruit is not present.  Cardiovascular: Normal rate, regular rhythm, normal heart sounds, intact distal pulses and normal pulses.   Pulmonary/Chest: Breath sounds normal. No respiratory distress.  Abdominal: Soft. Bowel sounds are normal. There is tenderness. There is no guarding.  Bowels sounds active. Tender in the epigastric area. Abd non distended. No mass. No CVAT.  Musculoskeletal: Normal range of motion.  Lymphadenopathy:       Head (right side): No submandibular adenopathy present.       Head (left side): No submandibular adenopathy present.    She has no cervical adenopathy.  Neurological: She is alert and oriented to person, place, and time. She has normal strength. No cranial nerve deficit or sensory deficit.  Skin: Skin is warm and dry.  Psychiatric: She has a normal mood and affect. Her speech is normal.  Nursing note and vitals reviewed.   ED Course  Procedures (including critical care time) Labs Review Labs Reviewed - No data to display  Imaging Review No results found.   EKG Interpretation None      MDM  Vital signs stable Cbc, and ua wnl. Preg test neg. Cmet reveals elevation of creat. (not new). CT reveal increase stool burden, but no obstruction. Internal organs wnl.  Exam suggest indigestion with reflux  Pt to use maalox and tums.   Final diagnoses:  Abdominal discomfort    *I have reviewed nursing notes, vital signs, and all appropriate lab and imaging results for this patient.[address] Garry Heater, PA-C 07/13/14 1149  Donnetta Hutching, MD 07/14/14 1052

## 2014-07-10 NOTE — ED Notes (Signed)
Patient with no complaints at this time. Respirations even and unlabored. Skin warm/dry. Discharge instructions reviewed with patient at this time. Patient given opportunity to voice concerns/ask questions. IV removed per policy and band-aid applied to site. Patient discharged at this time and left Emergency Department with steady gait.  

## 2014-07-10 NOTE — ED Notes (Signed)
Pt reports has been belching a lot recently and is concerned because of the strong odor.  Pt also c/o feeling bloated since yesterday.  Reports nausea, no vomiting.  Denies diarrhea.  LBM was yesterday morning but was small.

## 2014-07-10 NOTE — Discharge Instructions (Signed)
Please increase fiber. Please increase water. Use pepcid morning and evening. Use tums before meals and at bed time. Food Choices for Gastroesophageal Reflux Disease When you have gastroesophageal reflux disease (GERD), the foods you eat and your eating habits are very important. Choosing the right foods can help ease the discomfort of GERD. WHAT GENERAL GUIDELINES DO I NEED TO FOLLOW?  Choose fruits, vegetables, whole grains, low-fat dairy products, and low-fat meat, fish, and poultry.  Limit fats such as oils, salad dressings, butter, nuts, and avocado.  Keep a food diary to identify foods that cause symptoms.  Avoid foods that cause reflux. These may be different for different people.  Eat frequent small meals instead of three large meals each day.  Eat your meals slowly, in a relaxed setting.  Limit fried foods.  Cook foods using methods other than frying.  Avoid drinking alcohol.  Avoid drinking large amounts of liquids with your meals.  Avoid bending over or lying down until 2-3 hours after eating. WHAT FOODS ARE NOT RECOMMENDED? The following are some foods and drinks that may worsen your symptoms: Vegetables Tomatoes. Tomato juice. Tomato and spaghetti sauce. Chili peppers. Onion and garlic. Horseradish. Fruits Oranges, grapefruit, and lemon (fruit and juice). Meats High-fat meats, fish, and poultry. This includes hot dogs, ribs, ham, sausage, salami, and bacon. Dairy Whole milk and chocolate milk. Sour cream. Cream. Butter. Ice cream. Cream cheese.  Beverages Coffee and tea, with or without caffeine. Carbonated beverages or energy drinks. Condiments Hot sauce. Barbecue sauce.  Sweets/Desserts Chocolate and cocoa. Donuts. Peppermint and spearmint. Fats and Oils High-fat foods, including [place] fries and potato chips. Other Vinegar. Strong spices, such as black pepper, white pepper, red pepper, cayenne, curry powder, cloves, ginger, and chili powder. The items  listed above may not be a complete list of foods and beverages to avoid. Contact your dietitian for more information. Document Released: 06/22/2005 Document Revised: 06/27/2013 Document Reviewed: 04/26/2013 [facility] Patient Information 2015 [place], [place]. This information is not intended to replace advice given to you by your health care provider. Make sure you discuss any questions you have with your health care provider.  Gastroesophageal Reflux Disease, Adult Gastroesophageal reflux disease (GERD) happens when acid from your stomach goes into your food pipe (esophagus). The acid can cause a burning feeling in your chest. Over time, the acid can make small holes (ulcers) in your food pipe.  HOME CARE  Ask your doctor for advice about:  Losing weight.  Quitting smoking.  Alcohol use.  Avoid foods and drinks that make your problems worse. You may want to avoid:  Caffeine and alcohol.  Chocolate.  Mints.  Garlic and onions.  Spicy foods.  Citrus fruits, such as oranges, lemons, or limes.  Foods that contain tomato, such as sauce, chili, salsa, and pizza.  Fried and fatty foods.  Avoid lying down for 3 hours before you go to bed or before you take a nap.  Eat small meals often, instead of large meals.  Wear loose-fitting clothing. Do not wear anything tight around your waist.  Raise (elevate) the head of your bed 6 to 8 inches with wood blocks. Using extra pillows does not help.  Only take medicines as told by your doctor.  Do not take aspirin or ibuprofen. GET HELP RIGHT AWAY IF:   You have pain in your arms, neck, jaw, teeth, or back.  Your pain gets worse or changes.  You feel sick to your stomach (nauseous), throw up (vomit), or sweat (  diaphoresis).  You feel short of breath, or you pass out (faint).  Your throw up is green, yellow, black, or looks like coffee grounds or blood.  Your poop (stool) is red, bloody, or black. MAKE SURE YOU:   Understand  these instructions.  Will watch your condition.  Will get help right away if you are not doing well or get worse. Document Released: 12/09/2007 Document Revised: 09/14/2011 Document Reviewed: 01/09/2011 [facility] Patient Information 2015 [place]. This information is not intended to replace advice given to you by your health care provider. Make sure you discuss any questions you have with your health care provider.

## 2014-07-19 ENCOUNTER — Encounter (HOSPITAL_COMMUNITY): Payer: Self-pay | Admitting: Obstetrics and Gynecology

## 2014-08-09 ENCOUNTER — Ambulatory Visit (INDEPENDENT_AMBULATORY_CARE_PROVIDER_SITE_OTHER): Payer: Medicaid Other | Admitting: Adult Health

## 2014-08-09 ENCOUNTER — Encounter: Payer: Self-pay | Admitting: Adult Health

## 2014-08-09 VITALS — BP 110/62 | Ht 65.0 in | Wt 218.0 lb

## 2014-08-09 DIAGNOSIS — N898 Other specified noninflammatory disorders of vagina: Secondary | ICD-10-CM | POA: Insufficient documentation

## 2014-08-09 HISTORY — DX: Other specified noninflammatory disorders of vagina: N89.8

## 2014-08-09 LAB — POCT WET PREP (WET MOUNT)
WBC WET PREP: POSITIVE
WBC, Wet Prep HPF POC: NEGATIVE

## 2014-08-09 NOTE — Patient Instructions (Signed)
Review handouts on tubal and ablation and call me when decides

## 2014-08-09 NOTE — Progress Notes (Signed)
Subjective:     Patient ID: Holly Hodge, female   DOB: 12/15/1985, 29 y.o.   MRN: [id number]  HPI [name] is a 29 year old black female in complaining of brown vaginal discharge for 2-3 days, no itching.She is on depo and had BTB in past but that stopped with megace.She has history of PE and can't use COC.  Review of Systems Patient denies any headaches,hearing loss or fatigue, blurred vision, shortness of breath, chest pain, abdominal pain, problems with bowel movements, urination, or intercourse. No joint pain or mood swings.   Reviewed past medical,surgical, social and family history. Reviewed medications and allergies.   Objective:   Physical Exam BP 110/62 mmHg  Ht 5\' 5"  (1.651 m)  Wt 218 lb (98.884 kg)  BMI 36.28 kg/m2  LMP 08/08/2014  Breastfeeding? No Skin warm and dry.Pelvic: external genitalia is normal in appearance, vagina: dark brown discharge without odor, cervix:smooth and bulbous, uterus: normal size, shape and contour, non tender, no masses felt, adnexa: no masses or tenderness noted. Wet prep: + for RBC   Discussed may be old blood, but no infection, and that tubal and ablation is option if does not want any more children and periods are a problem.The other option is IUD and nexplanon. Next depo due in March.  Assessment:     Vaginal discharge    Plan:     Review handouts on tubal ligation and endo ablation Call if wants to do this and sign permit, she is aware 30 day wait

## 2014-09-26 ENCOUNTER — Encounter: Payer: Self-pay | Admitting: *Deleted

## 2014-09-26 ENCOUNTER — Ambulatory Visit (INDEPENDENT_AMBULATORY_CARE_PROVIDER_SITE_OTHER): Payer: Medicaid Other | Admitting: *Deleted

## 2014-09-26 DIAGNOSIS — Z3202 Encounter for pregnancy test, result negative: Secondary | ICD-10-CM

## 2014-09-26 DIAGNOSIS — Z3042 Encounter for surveillance of injectable contraceptive: Secondary | ICD-10-CM

## 2014-09-26 LAB — POCT URINE PREGNANCY: Preg Test, Ur: NEGATIVE

## 2014-09-26 MED ORDER — MEDROXYPROGESTERONE ACETATE 150 MG/ML IM SUSP
150.0000 mg | Freq: Once | INTRAMUSCULAR | Status: AC
  Administered 2014-09-26: 150 mg via INTRAMUSCULAR

## 2014-09-26 NOTE — Progress Notes (Signed)
Pt here for Depo shot. Pt wants to discuss other birth control options. Scheduled to return in 6 weeks to discuss options. JSY

## 2014-11-07 ENCOUNTER — Ambulatory Visit (INDEPENDENT_AMBULATORY_CARE_PROVIDER_SITE_OTHER): Payer: Medicaid Other | Admitting: Advanced Practice Midwife

## 2014-11-07 ENCOUNTER — Encounter: Payer: Self-pay | Admitting: Advanced Practice Midwife

## 2014-11-07 VITALS — BP 120/70 | HR 72 | Ht 64.0 in | Wt 233.0 lb

## 2014-11-07 DIAGNOSIS — Z3009 Encounter for other general counseling and advice on contraception: Secondary | ICD-10-CM

## 2014-11-07 DIAGNOSIS — Z309 Encounter for contraceptive management, unspecified: Secondary | ICD-10-CM | POA: Diagnosis not present

## 2014-11-08 NOTE — Progress Notes (Signed)
[facility]  Patient name: Holly Hodge MRN [id number]  Date of birth: 06/15/1986  CC & HPI:  Holly Hodge is a 29 y.o. African American female presenting today to discuss a different form of Birth control. She has been on depo for 6 months and has gained about 20 lbs. She craves carbs and eats a lot of sweets.  Has had Nexplanon in the past, soesn't remember if she gained weight.   Pertinent History Reviewed:  Medical & Surgical Hx:   Past Medical History  Diagnosis Date  . Anxiety   . 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/11/2012  . PE (pulmonary embolism) 2013  . Irregular intermenstrual bleeding 06/12/2014  . Vaginal discharge 08/09/2014   Past Surgical History  Procedure Laterality Date  . Cesarean section      x2  . Cesarean section N/A 02/14/2014    Procedure: CESAREAN SECTION;  Surgeon: Adam PhenixJames G Arnold, MD;  Location: [facility];  Service: Obstetrics;  Laterality: N/A;  . Cholecystectomy  2005  . Wisdom tooth extraction     Family History  Problem Relation Age of Onset  . Hypertension Mother   . Hypertension Father   . Stroke Father   . Cancer Other     runs on both sides of family  . Cancer Paternal Grandmother     Current outpatient prescriptions:  .  medroxyPROGESTERone (DEPO-PROVERA) 150 MG/ML injection, Inject 1 mL (150 mg total) into the muscle every 3 (three) months., Disp: 1 mL, Rfl: 3 .  ibuprofen (ADVIL,MOTRIN) 200 MG tablet, Take 200 mg by mouth every 4 (four) hours as needed., Disp: , Rfl:  Social History: Reviewed -  reports that she has been smoking Cigarettes.  She has a 5 pack-year smoking history. She has never used smokeless tobacco.  Review of Systems:   Constitutional: Negative for fever and chills Eyes: Negative for visual disturbances Respiratory: Negative for shortness of breath, dyspnea Cardiovascular: Negative for chest pain or palpitations  Gastrointestinal: Negative for vomiting, diarrhea and constipation; no abdominal  pain Genitourinary: Negative for dysuria and urgency, vaginal irritation or itching Musculoskeletal: Negative for back pain, joint pain, myalgias : Negative for dizziness and headaches    Objective Findings:  Vitals: BP 120/70 mmHg  Pulse 72  Ht 5\' 4"  (1.626 m)  Wt 233 lb (105.688 kg)  BMI 39.97 kg/m2  LMP 09/23/2014  Physical Examination: General appearance - alert, well appearing, and in no distress Mental status - alert, oriented to person, place, and time  Discussed different BC options.  Wants to try nexplanon again.  Talked about decreasing carbs (quit buying them!) to lose weight   Assessment & Plan:  A:   Contraception counseling P:     F/U 3 weeks for Nexplanon   CRESENZO-DISHMAN,Marceil Welp CNM 11/08/2014 4:34 PM

## 2014-12-06 ENCOUNTER — Encounter: Payer: Self-pay | Admitting: Advanced Practice Midwife

## 2014-12-06 ENCOUNTER — Ambulatory Visit (INDEPENDENT_AMBULATORY_CARE_PROVIDER_SITE_OTHER): Payer: Medicaid Other | Admitting: Advanced Practice Midwife

## 2014-12-06 VITALS — BP 122/60 | HR 92 | Wt 230.0 lb

## 2014-12-06 DIAGNOSIS — Z30018 Encounter for initial prescription of other contraceptives: Secondary | ICD-10-CM | POA: Diagnosis not present

## 2014-12-06 DIAGNOSIS — Z30017 Encounter for initial prescription of implantable subdermal contraceptive: Secondary | ICD-10-CM | POA: Insufficient documentation

## 2014-12-06 DIAGNOSIS — Z3202 Encounter for pregnancy test, result negative: Secondary | ICD-10-CM

## 2014-12-06 LAB — POCT URINE PREGNANCY: Preg Test, Ur: NEGATIVE

## 2014-12-06 NOTE — Progress Notes (Signed)
  HPI:  Holly Hodge is a 29 y.o. year old African American female here for Nexplanon insertion. She has been on depo (last shot end of March) and gained 20 lbs (craves carbs). Her pregnancy test today was negative.  Risks/benefits/side effects of Nexplanon have been discussed and her questions have been answered.  Specifically, a failure rate of 07/998 has been reported, with an increased failure rate if pt takes St. John's Wort and/or antiseizure medicaitons.  Holly Hodge is aware of the common side effect of irregular bleeding, which the incidence of decreases over time.   Past Medical History: Past Medical History  Diagnosis Date  . Anxiety   . 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/11/2012  . PE (pulmonary embolism) 2013  . Irregular intermenstrual bleeding 06/12/2014  . Vaginal discharge 08/09/2014    Past Surgical History: Past Surgical History  Procedure Laterality Date  . Cesarean section      x2  . Cesarean section N/A 02/14/2014    Procedure: CESAREAN SECTION;  Surgeon: Holly PhenixJames G Arnold, MD;  Location: [facility];  Service: Obstetrics;  Laterality: N/A;  . Cholecystectomy  2005  . Wisdom tooth extraction      Family History: Family History  Problem Relation Age of Onset  . Hypertension Mother   . Hypertension Father   . Stroke Father   . Cancer Other     runs on both sides of family  . Cancer Paternal Grandmother     Social History: History  Substance Use Topics  . Smoking status: Current Every Day Smoker -- 0.50 packs/day for 10 years    Types: Cigarettes  . Smokeless tobacco: Never Used  . Alcohol Use: Yes     Comment: occasionally    Allergies: No Known Allergies    Her left arm, approximatly 4 inches proximal from the elbow, was cleansed with alcohol and anesthetized with 2cc of 2% Lidocaine.  The area was cleansed again and the Nexplanon was inserted without difficulty.  A pressure bandage was applied.  Pt was instructed to remove pressure bandage in a few hours,  and keep insertion site covered with a bandaid for 3 days.  Follow-up scheduled PRN problems  CRESENZO-DISHMAN,[place] 12/06/2014 9:11 AM

## 2014-12-13 ENCOUNTER — Encounter (HOSPITAL_COMMUNITY): Payer: Self-pay | Admitting: Obstetrics and Gynecology

## 2014-12-24 ENCOUNTER — Encounter: Payer: Self-pay | Admitting: Adult Health

## 2014-12-24 ENCOUNTER — Ambulatory Visit (INDEPENDENT_AMBULATORY_CARE_PROVIDER_SITE_OTHER): Payer: Medicaid Other | Admitting: Adult Health

## 2014-12-24 VITALS — BP 110/70 | HR 76 | Ht 65.0 in | Wt 232.5 lb

## 2014-12-24 DIAGNOSIS — B379 Candidiasis, unspecified: Secondary | ICD-10-CM

## 2014-12-24 DIAGNOSIS — N898 Other specified noninflammatory disorders of vagina: Secondary | ICD-10-CM | POA: Insufficient documentation

## 2014-12-24 HISTORY — DX: Other specified noninflammatory disorders of vagina: N89.8

## 2014-12-24 HISTORY — DX: Candidiasis, unspecified: B37.9

## 2014-12-24 LAB — POCT WET PREP (WET MOUNT)

## 2014-12-24 MED ORDER — FLUCONAZOLE 150 MG PO TABS: ORAL_TABLET | ORAL | Status: DC

## 2014-12-24 MED ORDER — NYSTATIN-TRIAMCINOLONE 100000-0.1 UNIT/GM-% EX CREA: 1.0000 "application " | TOPICAL_CREAM | Freq: Two times a day (BID) | CUTANEOUS | Status: DC

## 2014-12-24 NOTE — Patient Instructions (Addendum)
Take diflucan  Wash with water no soap for now, keep it dry Follow up prn

## 2014-12-24 NOTE — Progress Notes (Signed)
Subjective:     Patient ID: Holly Hodge, female   DOB: [birth year], 29 y.o.   MRN: [id number]  HPI Holly Hodge is a 29 year old black female in complaining of vaginal irritation and itch at clitoral area.Has used new soap and detergent.  Review of Systems Patient denies any headaches, hearing loss, fatigue, blurred vision, shortness of breath, chest pain, abdominal pain, problems with bowel movements, urination, or intercourse(no sex lately). No joint pain or mood swings. See HPI for positives.  Reviewed past medical,surgical, social and family history. Reviewed medications and allergies.     Objective:   Physical Exam BP 110/70 mmHg  Pulse 76  Ht 5\' 5"  (1.651 m)  Wt 232 lb 8 oz (105.461 kg)  BMI 38.69 kg/m2  Breastfeeding? No Skin warm and dry.Pelvic: external genitalia is normal in appearance no lesions, vagina: white discharge without odor,urethra has no lesions or masses noted, cervix:smooth and bulbous, uterus: normal size, shape and contour, non tender, no masses felt, adnexa: no masses or tenderness noted. Bladder is non tender and no masses felt. Wet prep: + for few yeast  and +WBCs.     Assessment:     Vaginal irritation Yeast     Plan:    Rx mytrex cream use 2-3 x daily prn Rx diflucan 150 mg #2 take 1 now and 1 in 3 days with 1 refill Wash with water, no soap, and keep dry  Follow up prn

## 2015-07-30 ENCOUNTER — Ambulatory Visit: Payer: Medicaid Other | Admitting: Advanced Practice Midwife

## 2015-07-30 ENCOUNTER — Encounter: Payer: Self-pay | Admitting: Advanced Practice Midwife

## 2016-04-20 ENCOUNTER — Other Ambulatory Visit: Payer: Medicaid Other | Admitting: Adult Health

## 2016-04-24 ENCOUNTER — Emergency Department (HOSPITAL_COMMUNITY): Payer: Medicaid Other

## 2016-04-24 ENCOUNTER — Emergency Department (HOSPITAL_COMMUNITY)
Admission: EM | Admit: 2016-04-24 | Discharge: 2016-04-24 | Disposition: A | Discharge status: Home / Self Care | Payer: Medicaid Other | Attending: Emergency Medicine | Admitting: Emergency Medicine

## 2016-04-24 ENCOUNTER — Encounter (HOSPITAL_COMMUNITY): Payer: Self-pay | Admitting: Emergency Medicine

## 2016-04-24 DIAGNOSIS — R112 Nausea with vomiting, unspecified: Secondary | ICD-10-CM | POA: Insufficient documentation

## 2016-04-24 DIAGNOSIS — R1013 Epigastric pain: Secondary | ICD-10-CM | POA: Insufficient documentation

## 2016-04-24 DIAGNOSIS — Z791 Long term (current) use of non-steroidal anti-inflammatories (NSAID): Secondary | ICD-10-CM | POA: Insufficient documentation

## 2016-04-24 DIAGNOSIS — F17210 Nicotine dependence, cigarettes, uncomplicated: Secondary | ICD-10-CM | POA: Insufficient documentation

## 2016-04-24 LAB — CBC
HEMATOCRIT: 45.2 % (ref 36.0–46.0)
HEMOGLOBIN: 15.4 g/dL — AB (ref 12.0–15.0)
MCH: 31.1 pg (ref 26.0–34.0)
MCHC: 34.1 g/dL (ref 30.0–36.0)
MCV: 91.3 fL (ref 78.0–100.0)
PLATELETS: 308 10*3/uL (ref 150–400)
RBC: 4.95 MIL/uL (ref 3.87–5.11)
RDW: 13.4 % (ref 11.5–15.5)
WBC: 5.7 10*3/uL (ref 4.0–10.5)

## 2016-04-24 LAB — URINALYSIS, ROUTINE W REFLEX MICROSCOPIC
Bilirubin Urine: NEGATIVE
GLUCOSE, UA: NEGATIVE mg/dL
Ketones, ur: NEGATIVE mg/dL
LEUKOCYTES UA: NEGATIVE
Nitrite: NEGATIVE
pH: 6 (ref 5.0–8.0)

## 2016-04-24 LAB — COMPREHENSIVE METABOLIC PANEL
ALT: 22 U/L (ref 14–54)
AST: 26 U/L (ref 15–41)
Albumin: 4.2 g/dL (ref 3.5–5.0)
Alkaline Phosphatase: 82 U/L (ref 38–126)
Anion gap: 8 (ref 5–15)
BUN: 13 mg/dL (ref 6–20)
CHLORIDE: 104 mmol/L (ref 101–111)
CO2: 22 mmol/L (ref 22–32)
CREATININE: 1.09 mg/dL — AB (ref 0.44–1.00)
Calcium: 9.1 mg/dL (ref 8.9–10.3)
GFR calc non Af Amer: >60 mL/min (ref >60)
Glucose, Bld: 140 mg/dL — ABNORMAL HIGH (ref 65–99)
POTASSIUM: 3.4 mmol/L — AB (ref 3.5–5.1)
SODIUM: 134 mmol/L — AB (ref 135–145)
Total Bilirubin: 0.8 mg/dL (ref 0.3–1.2)
Total Protein: 7.7 g/dL (ref 6.5–8.1)

## 2016-04-24 LAB — PREGNANCY, URINE: Preg Test, Ur: NEGATIVE

## 2016-04-24 LAB — LIPASE, BLOOD: LIPASE: 35 U/L (ref 11–51)

## 2016-04-24 LAB — URINE MICROSCOPIC-ADD ON

## 2016-04-24 MED ORDER — GI COCKTAIL ~~LOC~~
30.0000 mL | Freq: Once | ORAL | Status: AC
  Administered 2016-04-24: 30 mL via ORAL
  Filled 2016-04-24: qty 30

## 2016-04-24 MED ORDER — FAMOTIDINE 20 MG PO TABS: 20.0000 mg | ORAL_TABLET | Freq: Two times a day (BID) | ORAL | 0 refills | Status: DC

## 2016-04-24 MED ORDER — ONDANSETRON 8 MG PO TBDP
8.0000 mg | ORAL_TABLET | Freq: Once | ORAL | Status: AC
  Administered 2016-04-24: 8 mg via ORAL
  Filled 2016-04-24: qty 1

## 2016-04-24 MED ORDER — ONDANSETRON HCL 4 MG PO TABS: 4.0000 mg | ORAL_TABLET | Freq: Three times a day (TID) | ORAL | 0 refills | Status: DC | PRN

## 2016-04-24 MED ORDER — FAMOTIDINE 20 MG PO TABS
40.0000 mg | ORAL_TABLET | Freq: Once | ORAL | Status: AC
  Administered 2016-04-24: 40 mg via ORAL
  Filled 2016-04-24: qty 2

## 2016-04-24 MED ORDER — DICYCLOMINE HCL 10 MG/ML IM SOLN
20.0000 mg | Freq: Once | INTRAMUSCULAR | Status: AC
  Administered 2016-04-24: 20 mg via INTRAMUSCULAR
  Filled 2016-04-24: qty 2

## 2016-04-24 MED ORDER — OXYCODONE-ACETAMINOPHEN 5-325 MG PO TABS
1.0000 | ORAL_TABLET | Freq: Once | ORAL | Status: AC
  Administered 2016-04-24: 1 via ORAL
  Filled 2016-04-24: qty 1

## 2016-04-24 NOTE — ED Triage Notes (Signed)
PT c/o upper abdominal discomfort more in the evening with n/v starting x3 days ago. PT states BM yesterday. PT denies any urinary symptoms or vaginal discharge.

## 2016-04-24 NOTE — ED Provider Notes (Signed)
[facility] Provider Note   CSN: [id number] Arrival date & time: 04/24/16  1112     History   Chief Complaint Chief Complaint  Patient presents with  . Abdominal Pain    HPI Holly Hodge is a 30 y.o. female.  HPI  Pt was seen at 1145.  Per pt, c/o gradual onset and persistence of multiple intermittent episodes of acute flair of her chronic/recurrent mid-epigastric abd "pain" for the past 3 days.  Has been associated with multiple intermittent episodes of N/V.  Describes the abd pain as "cramping."  Symptoms occur at night after she eats and goes to bed.  Denies diarrhea, no fevers, no back pain, no rash, no CP/SOB, no black or blood in stools or emesis.  The symptoms have been associated with no other complaints. The patient has a significant history of similar symptoms previously, recently being evaluated for this complaint and multiple prior evals for same.         Past Medical History:  Diagnosis Date  . Anxiety   . Irregular intermenstrual bleeding 06/12/2014  . 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/11/2012  . PE (pulmonary embolism) 2013  . Vaginal discharge 08/09/2014  . Vaginal irritation 12/24/2014  . Yeast infection 12/24/2014    Patient Active Problem List   Diagnosis Date Noted  . Vaginal irritation 12/24/2014  . Yeast infection 12/24/2014  . Nexplanon insertion 12/06/2014  . Vaginal discharge 08/09/2014  . Irregular intermenstrual bleeding 06/12/2014  . Abnormal Pap smear of cervix 05/21/2014  . BV (bacterial vaginosis) 04/10/2014  . Post-operative state 02/26/2014  . S/P cesarean section 02/14/2014  . 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/11/2012  . Nausea and vomiting 03/09/2012  . 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/08/2012  . Tobacco abuse 03/08/2012  . Obesity 03/08/2012  . Menorrhagia 03/08/2012    Past Surgical History:  Procedure Laterality Date  . CESAREAN SECTION     x2  . CESAREAN SECTION N/A 02/14/2014   Procedure: CESAREAN SECTION;   Surgeon: Adam Phenix, MD;  Location: [facility];  Service: Obstetrics;  Laterality: N/A;  . CHOLECYSTECTOMY  2005  . WISDOM TOOTH EXTRACTION      OB History    Gravida Para Term Preterm AB Living   3 3 3     3    SAB TAB Ectopic Multiple Live Births           1       Home Medications    Prior to Admission medications   Medication Sig Start Date End Date Taking? Authorizing Provider  etonogestrel (NEXPLANON) 68 MG IMPL implant 1 each by Subdermal route once.    Historical Provider, MD  fluconazole (DIFLUCAN) 150 MG tablet Take 1 now and 1 in 3 days 12/24/14   Adline Potter, NP  ibuprofen (ADVIL,MOTRIN) 200 MG tablet Take 200 mg by mouth every 4 (four) hours as needed.    Historical Provider, MD  nystatin-triamcinolone (MYCOLOG II) cream Apply 1 application topically 2 (two) times daily. 12/24/14   Adline Potter, NP    Family History Family History  Problem Relation Age of Onset  . Hypertension Mother   . Hypertension Father   . Stroke Father   . Cancer Paternal Grandmother   . Cancer Other     runs on both sides of family    Social History Social History  Substance Use Topics  . Smoking status: Current Every Day Smoker    Packs/day: 0.50    Years: 10.00    Types:  Cigarettes  . Smokeless tobacco: Never Used  . Alcohol use Yes     Comment: occasionally     Allergies   Review of patient's allergies indicates no known allergies.   Review of Systems Review of Systems ROS: Statement: All systems negative except as marked or noted in the HPI; Constitutional: Negative for fever and chills. ; ; Eyes: Negative for eye pain, redness and discharge. ; ; ENMT: Negative for ear pain, hoarseness, nasal congestion, sinus pressure and sore throat. ; ; Cardiovascular: Negative for chest pain, palpitations, diaphoresis, dyspnea and peripheral edema. ; ; Respiratory: Negative for cough, wheezing and stridor. ; ; Gastrointestinal: +N/V, abd pain. Negative for diarrhea, blood in  stool, hematemesis, jaundice and rectal bleeding. . ; ; Genitourinary: Negative for dysuria, flank pain and hematuria. ; ; Musculoskeletal: Negative for back pain and neck pain. Negative for swelling and trauma.; ; Skin: Negative for pruritus, rash, abrasions, blisters, bruising and skin lesion.; ; Neuro: Negative for headache, lightheadedness and neck stiffness. Negative for weakness, altered level of consciousness, altered mental status, extremity weakness, paresthesias, involuntary movement, seizure and syncope.       Physical Exam Updated Vital Signs BP 131/71 (BP Location: Left Arm)   Pulse 112   Temp 98.9 F (37.2 C) (Oral)   Resp 16   Ht 5\' 5"  (1.651 m)   Wt 230 lb (104.3 kg)   LMP 04/21/2016 Comment: neg preg test  SpO2 98%   BMI 38.27 kg/m   Physical Exam 1150: Physical examination:  Nursing notes reviewed; Vital signs and O2 SAT reviewed;  Constitutional: Well developed, Well nourished, Well hydrated, In no acute distress; Head:  Normocephalic, atraumatic; Eyes: EOMI, PERRL, No scleral icterus; ENMT: Mouth and pharynx normal, Mucous membranes moist; Neck: Supple, Full range of motion, No lymphadenopathy; Cardiovascular: Regular rate and rhythm, No murmur, rub, or gallop; Respiratory: Breath sounds clear & equal bilaterally, No rales, rhonchi, wheezes.  Speaking full sentences with ease, Normal respiratory effort/excursion; Chest: Nontender, Movement normal; Abdomen: Soft, +mid-epigastric tenderness to palp. No rebound or guarding. Nondistended, Normal bowel sounds; Genitourinary: No CVA tenderness; Extremities: Pulses normal, No tenderness, No edema, No calf edema or asymmetry.; Neuro: AA&Ox3, Major CN grossly intact.  Speech clear. No gross focal motor or sensory deficits in extremities.; Skin: Color normal, Warm, Dry.   ED Treatments / Results  Labs (all labs ordered are listed, but only abnormal results are displayed)   EKG  EKG Interpretation None        Radiology   Procedures Procedures (including critical care time)  Medications Ordered in ED Medications  ondansetron (ZOFRAN-ODT) disintegrating tablet 8 mg (8 mg Oral Given 04/24/16 1208)  gi cocktail (Maalox,Lidocaine,Donnatal) (30 mLs Oral Given 04/24/16 1208)  famotidine (PEPCID) tablet 40 mg (40 mg Oral Given 04/24/16 1208)  dicyclomine (BENTYL) injection 20 mg (20 mg Intramuscular Given 04/24/16 1209)  oxyCODONE-acetaminophen (PERCOCET/ROXICET) 5-325 MG per tablet 1 tablet (1 tablet Oral Given 04/24/16 1327)     Initial Impression / Assessment and Plan / ED Course  I have reviewed the triage vital signs and the nursing notes.  Pertinent labs & imaging results that were available during my care of the patient were reviewed by me and considered in my medical decision making (see chart for details).  MDM Reviewed: nursing note, vitals and previous chart Reviewed previous: labs and CT scan Interpretation: labs and x-ray   Results for orders placed or performed during the hospital encounter of 04/24/16  Lipase, blood  Result Value Ref  Range   Lipase 35 11 - 51 U/L  Comprehensive metabolic panel  Result Value Ref Range   Sodium 134 (L) 135 - 145 mmol/L   Potassium 3.4 (L) 3.5 - 5.1 mmol/L   Chloride 104 101 - 111 mmol/L   CO2 22 22 - 32 mmol/L   Glucose, Bld 140 (H) 65 - 99 mg/dL   BUN 13 6 - 20 mg/dL   Creatinine, Ser 4.09 (H) 0.44 - 1.00 mg/dL   Calcium 9.1 8.9 - 81.1 mg/dL   Total Protein 7.7 6.5 - 8.1 g/dL   Albumin 4.2 3.5 - 5.0 g/dL   AST 26 15 - 41 U/L   ALT 22 14 - 54 U/L   Alkaline Phosphatase 82 38 - 126 U/L   Total Bilirubin 0.8 0.3 - 1.2 mg/dL   GFR calc non Af Amer >60 >60 mL/min   GFR calc Af Amer >60 >60 mL/min   Anion gap 8 5 - 15  CBC  Result Value Ref Range   WBC 5.7 4.0 - 10.5 K/uL   RBC 4.95 3.87 - 5.11 MIL/uL   Hemoglobin 15.4 (H) 12.0 - 15.0 g/dL   HCT 91.4 78.2 - 95.6 %   MCV 91.3 78.0 - 100.0 fL   MCH 31.1 26.0 - 34.0 pg   MCHC  34.1 30.0 - 36.0 g/dL   RDW 21.3 08.6 - 57.8 %   Platelets 308 150 - 400 K/uL  Urinalysis, Routine w reflex microscopic  Result Value Ref Range   Color, Urine YELLOW YELLOW   APPearance HAZY (A) CLEAR   Specific Gravity, Urine >1.030 (H) 1.005 - 1.030   pH 6.0 5.0 - 8.0   Glucose, UA NEGATIVE NEGATIVE mg/dL   Hgb urine dipstick TRACE (A) NEGATIVE   Bilirubin Urine NEGATIVE NEGATIVE   Ketones, ur NEGATIVE NEGATIVE mg/dL   Protein, ur TRACE (A) NEGATIVE mg/dL   Nitrite NEGATIVE NEGATIVE   Leukocytes, UA NEGATIVE NEGATIVE  Pregnancy, urine  Result Value Ref Range   Preg Test, Ur NEGATIVE NEGATIVE  Urine microscopic-add on  Result Value Ref Range   Squamous Epithelial / LPF TOO NUMEROUS TO COUNT (A) NONE SEEN   WBC, UA 0-5 0 - 5 WBC/hpf   RBC / HPF 0-5 0 - 5 RBC/hpf   Bacteria, UA MANY (A) NONE SEEN   Dg Abd Acute W/chest Result Date: 04/24/2016 CLINICAL DATA:  Epigastric and abdominal pain with vomiting for 3 days. EXAM: DG ABDOMEN ACUTE W/ 1V CHEST COMPARISON:  07/10/2014 CT and prior exams. FINDINGS: The cardiomediastinal silhouette is unremarkable. The lungs are clear. No pleural effusion pneumothorax identified. Nondistended gas-filled loops of small bowel are present. No dilated bowel loops or pneumoperitoneum identified. Small amount of gas is noted within the colon and rectum. Cholecystectomy clips are present. No suspicious calcifications are noted. IMPRESSION: Nonspecific nonobstructive bowel gas pattern. No evidence of pneumoperitoneum. No evidence of active cardiopulmonary disease. Electronically Signed   By: Harmon Pier M.D.   On: 04/24/2016 12:45    1340:  Udip contaminated; pt denies dysuria. Cr elevated per baseline. Pt has tol PO well while in the ED without N/V.  No stooling while in the ED.  Abd benign, VSS. Feels better and wants to go home now. Tx symptomatically, f/u GI MD. Dx and testing d/w pt.  Questions answered.  Verb understanding, agreeable to d/c home with  outpt f/u.    Final Clinical Impressions(s) / ED Diagnoses   Final diagnoses:  None    New  Prescriptions New Prescriptions   No medications on file     Samuel JesterKathleen Earon Rivest, DO 04/26/16 1724

## 2016-04-24 NOTE — Discharge Instructions (Signed)
Eat a bland diet, avoiding greasy, fatty, fried foods, as well as spicy and acidic foods or beverages.  Avoid eating within the hour or 2 before going to bed or laying down.  Also avoid teas, colas, coffee, chocolate, pepermint and spearment. May also take over the counter maalox/mylanta, as directed on packaging, as needed for discomfort.  Take the prescriptions as directed.  Call your regular medical doctor today to schedule a follow up appointment in the next 2 days. Call the GI doctor today to schedule a follow up appointment next week.  Return to the Emergency Department immediately if worsening.

## 2016-08-21 ENCOUNTER — Other Ambulatory Visit: Payer: Medicaid Other | Admitting: Adult Health

## 2016-09-11 ENCOUNTER — Emergency Department (HOSPITAL_COMMUNITY): Payer: BLUE CROSS/BLUE SHIELD

## 2016-09-11 ENCOUNTER — Encounter (HOSPITAL_COMMUNITY): Payer: Self-pay

## 2016-09-11 ENCOUNTER — Emergency Department (HOSPITAL_COMMUNITY)
Admission: EM | Admit: 2016-09-11 | Discharge: 2016-09-11 | Disposition: A | Discharge status: Home / Self Care | Payer: BLUE CROSS/BLUE SHIELD | Attending: Emergency Medicine | Admitting: Emergency Medicine

## 2016-09-11 DIAGNOSIS — R1013 Epigastric pain: Secondary | ICD-10-CM | POA: Insufficient documentation

## 2016-09-11 DIAGNOSIS — F17210 Nicotine dependence, cigarettes, uncomplicated: Secondary | ICD-10-CM | POA: Insufficient documentation

## 2016-09-11 DIAGNOSIS — R1010 Upper abdominal pain, unspecified: Secondary | ICD-10-CM | POA: Diagnosis present

## 2016-09-11 DIAGNOSIS — R630 Anorexia: Secondary | ICD-10-CM | POA: Diagnosis not present

## 2016-09-11 DIAGNOSIS — Z79899 Other long term (current) drug therapy: Secondary | ICD-10-CM | POA: Insufficient documentation

## 2016-09-11 LAB — CBC WITH DIFFERENTIAL/PLATELET
BASOS ABS: 0 10*3/uL (ref 0.0–0.1)
BASOS PCT: 0 %
EOS PCT: 1 %
Eosinophils Absolute: 0.1 10*3/uL (ref 0.0–0.7)
HCT: 42.5 % (ref 36.0–46.0)
Hemoglobin: 14.5 g/dL (ref 12.0–15.0)
Lymphocytes Relative: 35 %
Lymphs Abs: 2.5 10*3/uL (ref 0.7–4.0)
MCH: 31.5 pg (ref 26.0–34.0)
MCHC: 34.1 g/dL (ref 30.0–36.0)
MCV: 92.4 fL (ref 78.0–100.0)
MONO ABS: 0.4 10*3/uL (ref 0.1–1.0)
MONOS PCT: 5 %
Neutro Abs: 4.2 10*3/uL (ref 1.7–7.7)
Neutrophils Relative %: 59 %
PLATELETS: 312 10*3/uL (ref 150–400)
RBC: 4.6 MIL/uL (ref 3.87–5.11)
RDW: 13.9 % (ref 11.5–15.5)
WBC: 7.1 10*3/uL (ref 4.0–10.5)

## 2016-09-11 LAB — COMPREHENSIVE METABOLIC PANEL
ALBUMIN: 3.8 g/dL (ref 3.5–5.0)
ALT: 20 U/L (ref 14–54)
AST: 20 U/L (ref 15–41)
Alkaline Phosphatase: 73 U/L (ref 38–126)
Anion gap: 8 (ref 5–15)
BUN: 8 mg/dL (ref 6–20)
CHLORIDE: 109 mmol/L (ref 101–111)
CO2: 22 mmol/L (ref 22–32)
Calcium: 9.1 mg/dL (ref 8.9–10.3)
Creatinine, Ser: 0.82 mg/dL (ref 0.44–1.00)
GFR calc Af Amer: >60 mL/min (ref >60)
Glucose, Bld: 99 mg/dL (ref 65–99)
POTASSIUM: 3.7 mmol/L (ref 3.5–5.1)
Sodium: 139 mmol/L (ref 135–145)
TOTAL PROTEIN: 7 g/dL (ref 6.5–8.1)
Total Bilirubin: 0.6 mg/dL (ref 0.3–1.2)

## 2016-09-11 LAB — PREGNANCY, URINE: Preg Test, Ur: NEGATIVE

## 2016-09-11 LAB — LIPASE, BLOOD: LIPASE: 13 U/L (ref 11–51)

## 2016-09-11 MED ORDER — FAMOTIDINE 20 MG PO TABS: 20.0000 mg | ORAL_TABLET | Freq: Two times a day (BID) | ORAL | 0 refills | Status: DC

## 2016-09-11 MED ORDER — GI COCKTAIL ~~LOC~~
30.0000 mL | Freq: Once | ORAL | Status: AC
  Administered 2016-09-11: 30 mL via ORAL
  Filled 2016-09-11: qty 30

## 2016-09-11 NOTE — ED Provider Notes (Signed)
The patient is an obese 31 year old female who complains of epigastric pain, this is been going on for the better part of the last 12 hours. She reports that last night they went and took their child out to dinner, she had baked chicken at a [employer] restaurant, seemed okay last night but this morning awoke with increasing pain. She has not been nauseated or vomiting and has had no stools but continues to have pain in the epigastrium. She has been able to eat and drink and states that this does not make the pain necessarily worse. She does report that she drinks increased amounts of alcohol including vodka and tequila, she uses ibuprofen sinus medication all the time. She denies any aspirin use.  On my exam the patient is obese, she does not appear to be in distress, she has epigastric tenderness to palpation but no other abdominal tenderness. Her abdomen is very soft, there is no tympanitic sounds to percussion and she has no peritoneal signs. Lungs and heart are normal, lower extremities without significant edema.  The patient does have a history of pulmonary embolus and, as far as we can tell she is not anticoagulated, her epigastric discomfort seems to be more related to a gastrointestinal source such as peptic ulcer disease or pancreatitis, labs have been ordered, GI cocktail ordered, will need repeat evaluation.  Medical screening examination/treatment/procedure(s) were conducted as a shared visit with non-physician practitioner(s) and myself.  I personally evaluated the patient during the encounter.  Clinical Impression:   Final diagnoses:  Abdominal pain, acute, epigastric  Epigastric pain         Eber HongBrian Atianna Haidar, MD 09/12/16 [phone]

## 2016-09-11 NOTE — ED Triage Notes (Signed)
Patient reports of waking up with abdominal pain. Last BM was last night. Denies n/v/d.

## 2016-09-11 NOTE — ED Provider Notes (Signed)
[facility] Provider Note   CSN: [id number] Arrival date & time: 09/11/16  1839     History   Chief Complaint Chief Complaint  Patient presents with  . Abdominal Pain    HPI Holly Hodge is a 31 y.o. female.  Patient presents with 15hr history of upper abdominal pain that she describes as sharp, rates it as 9/10 and is associated with absence of bowel movement today but denies nausea, vomiting or diarrhea.  She said the pain woke her from her sleep at 3am and has not been comfortable since. Normally by this time she would have had several bowel movements but she has not had one today. She has been passing gas. She has not eaten anything due to the pain. Denies eating anything out of the ordinary yesterday before onset. Reports a history of 3 abdominal/pelvic surgeries, history of PE, NSAID use and alcohol use (vodka and tequila). She denies fever, recent illness, chest pain, trouble breathing, blood in stool, urinary symptoms or weight changes.       Past Medical History:  Diagnosis Date  . Anxiety   . Irregular intermenstrual bleeding 06/12/2014  . 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/11/2012  . PE (pulmonary embolism) 2013  . Vaginal discharge 08/09/2014  . Vaginal irritation 12/24/2014  . Yeast infection 12/24/2014    Patient Active Problem List   Diagnosis Date Noted  . Vaginal irritation 12/24/2014  . Yeast infection 12/24/2014  . Nexplanon insertion 12/06/2014  . Vaginal discharge 08/09/2014  . Irregular intermenstrual bleeding 06/12/2014  . Abnormal Pap smear of cervix 05/21/2014  . BV (bacterial vaginosis) 04/10/2014  . Post-operative state 02/26/2014  . S/P cesarean section 02/14/2014  . 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/11/2012  . Nausea and vomiting 03/09/2012  . 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/08/2012  . Tobacco abuse 03/08/2012  . Obesity 03/08/2012  . Menorrhagia 03/08/2012    Past Surgical History:  Procedure Laterality Date  . CESAREAN  SECTION     x2  . CESAREAN SECTION N/A 02/14/2014   Procedure: CESAREAN SECTION;  Surgeon: Adam PhenixJames G Arnold, MD;  Location: [facility];  Service: Obstetrics;  Laterality: N/A;  . CHOLECYSTECTOMY  2005  . WISDOM TOOTH EXTRACTION      OB History    Gravida Para Term Preterm AB Living   3 3 3     3    SAB TAB Ectopic Multiple Live Births           1       Home Medications    Prior to Admission medications   Medication Sig Start Date End Date Taking? Authorizing Provider  etonogestrel (NEXPLANON) 68 MG IMPL implant 1 each by Subdermal route once.   Yes Historical Provider, MD  pseudoephedrine-acetaminophen (TYLENOL SINUS) 30-500 MG TABS tablet Take 1 tablet by mouth every 4 (four) hours as needed.   Yes Historical Provider, MD  famotidine (PEPCID) 20 MG tablet Take 1 tablet (20 mg total) by mouth 2 (two) times daily. 09/11/16   Dietrich PatesHina Bannie Lobban, PA    Family History Family History  Problem Relation Age of Onset  . Hypertension Mother   . Hypertension Father   . Stroke Father   . Cancer Paternal Grandmother   . Cancer Other     runs on both sides of family    Social History Social History  Substance Use Topics  . Smoking status: Current Every Day Smoker    Packs/day: 0.50    Years: 10.00    Types: Cigarettes  .  Smokeless tobacco: Never Used  . Alcohol use Yes     Comment: occasionally     Allergies   Patient has no known allergies.   Review of Systems Review of Systems  Constitutional: Positive for appetite change (decreased appetite). Negative for diaphoresis and fever.  HENT: Negative for rhinorrhea, sneezing and sore throat.   Eyes: Negative for photophobia and redness.  Respiratory: Negative for cough, chest tightness and shortness of breath.   Cardiovascular: Negative for chest pain and leg swelling.  Gastrointestinal: Positive for abdominal pain. Negative for blood in stool, constipation, diarrhea and vomiting.  Endocrine: Negative for polyuria.  Genitourinary:  Negative for difficulty urinating, dysuria, flank pain, hematuria, menstrual problem, pelvic pain, vaginal bleeding and vaginal pain.  Musculoskeletal: Negative for back pain and myalgias.  Skin: Negative for rash.  Neurological: Negative for dizziness, weakness and light-headedness.     Physical Exam Updated Vital Signs BP 124/74 (BP Location: Left Arm)   Pulse 90   Temp 98.9 F (37.2 C) (Oral)   Resp 16   Ht 5\' 5"  (1.651 m)   Wt 104.3 kg   LMP 08/31/2016   SpO2 100%   BMI 38.27 kg/m   Physical Exam  Constitutional: She appears well-developed and well-nourished. No distress.  HENT:  Head: Normocephalic and atraumatic.  Nose: Nose normal.  Eyes: Conjunctivae and EOM are normal. Left eye exhibits no discharge. No scleral icterus.  Neck: Normal range of motion. Neck supple.  Cardiovascular: Normal rate, regular rhythm, normal heart sounds and intact distal pulses.  Exam reveals no gallop and no friction rub.   No murmur heard. Pulmonary/Chest: Effort normal and breath sounds normal. No respiratory distress.  Abdominal: Soft. Bowel sounds are normal. She exhibits no distension. There is tenderness (epigastric tenderness that improves slightly with palpation; negative McBurney's). There is no rebound and no guarding.  Musculoskeletal: Normal range of motion. She exhibits no edema.       Right shoulder: She exhibits no tenderness (No CVA tenderness).  Neurological: She is alert. She exhibits normal muscle tone. Coordination normal.  Skin: Skin is warm and dry. No rash noted. She is not diaphoretic.  Psychiatric: She has a normal mood and affect.  Nursing note and vitals reviewed.    ED Treatments / Results  Labs (all labs ordered are listed, but only abnormal results are displayed) Labs Reviewed  CBC WITH DIFFERENTIAL/PLATELET  COMPREHENSIVE METABOLIC PANEL  LIPASE, BLOOD  PREGNANCY, URINE    EKG  EKG Interpretation None       Radiology Dg Abd 2 Views  Result  Date: 09/11/2016 CLINICAL DATA:  Upper abdominal pain EXAM: ABDOMEN - 2 VIEW COMPARISON:  04/24/2016 FINDINGS: Lung bases are clear. No free air beneath the diaphragm. Surgical clips in the right upper quadrant. Nonobstructed bowel-gas pattern with moderate stool. No pathologic calcifications. IMPRESSION: Non obstructed bowel gas pattern Electronically Signed   By: Jasmine Pang M.D.   On: 09/11/2016 20:57    Procedures Procedures (including critical care time)  Medications Ordered in ED Medications  gi cocktail (Maalox,Lidocaine,Donnatal) (30 mLs Oral Given 09/11/16 1929)     Initial Impression / Assessment and Plan / ED Course  I have reviewed the triage vital signs and the nursing notes.  Pertinent labs & imaging results that were available during my care of the patient were reviewed by me and considered in my medical decision making (see chart for details).     Patient's symptoms greatly improved 20 minutes after taking GI cocktail. States the  pain is basically nonexistent since. Also states she feels like she will have a bowel movement soon.  However, due to patient's history, concern for pancreatitis and PUD was warranted. CBC, CMP, lipase, Upreg and acute abdominal series were ordered. All tests were normal/negative. Acute abdominal series showed nonobstructed bowel gas pattern. Patient's condition improved while in ED. Will discharge and begin taking Pepcid twice a day until symptoms improve.  Final Clinical Impressions(s) / ED Diagnoses   Final diagnoses:  Abdominal pain, acute, epigastric  Epigastric pain    New Prescriptions New Prescriptions   FAMOTIDINE (PEPCID) 20 MG TABLET    Take 1 tablet (20 mg total) by mouth 2 (two) times daily.     [place], [place] 09/11/16 2142    Eber Hong, MD 09/12/16 [phone]

## 2016-09-11 NOTE — Discharge Instructions (Signed)
Return to ED for severe symptoms such as blood in stool or vomiting blood. Begin taking Pepcid twice a day until symptoms improve. Begin diet as instructed on sheet. Follow up with primary care in 1 week.

## 2017-03-11 ENCOUNTER — Other Ambulatory Visit (HOSPITAL_COMMUNITY)
Admission: RE | Admit: 2017-03-11 | Discharge: 2017-03-11 | Disposition: A | Discharge status: Home / Self Care | Payer: Medicaid Other | Source: Ambulatory Visit | Attending: Advanced Practice Midwife | Admitting: Advanced Practice Midwife

## 2017-03-11 ENCOUNTER — Encounter: Payer: Self-pay | Admitting: Advanced Practice Midwife

## 2017-03-11 ENCOUNTER — Ambulatory Visit (INDEPENDENT_AMBULATORY_CARE_PROVIDER_SITE_OTHER): Payer: Medicaid Other | Admitting: Advanced Practice Midwife

## 2017-03-11 VITALS — BP 104/62 | HR 74 | Ht 66.0 in | Wt 238.5 lb

## 2017-03-11 DIAGNOSIS — Z01419 Encounter for gynecological examination (general) (routine) without abnormal findings: Secondary | ICD-10-CM | POA: Insufficient documentation

## 2017-03-11 DIAGNOSIS — R1032 Left lower quadrant pain: Secondary | ICD-10-CM

## 2017-03-11 MED ORDER — MEGESTROL ACETATE 40 MG PO TABS: ORAL_TABLET | ORAL | 3 refills | Status: DC

## 2017-03-11 NOTE — Progress Notes (Signed)
Holly Hodge 31 y.o.  Vitals:   03/11/17 1343  BP: 104/62  Pulse: 74     Filed Weights   03/11/17 1343  Weight: 238 lb 8 oz (108.2 kg)    Past Medical History: Past Medical History:  Diagnosis Date  . Anxiety   . Irregular intermenstrual bleeding 06/12/2014  . 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/11/2012  . PE (pulmonary embolism) 2013  . Vaginal discharge 08/09/2014  . Vaginal irritation 12/24/2014  . Yeast infection 12/24/2014    Past Surgical History: Past Surgical History:  Procedure Laterality Date  . CESAREAN SECTION     x2  . CESAREAN SECTION N/A 02/14/2014   Procedure: CESAREAN SECTION;  Surgeon: Adam PhenixJames G Arnold, MD;  Location: [facility];  Service: Obstetrics;  Laterality: N/A;  . CHOLECYSTECTOMY  2005  . WISDOM TOOTH EXTRACTION      Family History: Family History  Problem Relation Age of Onset  . Hypertension Mother   . Hypertension Father   . Stroke Father   . Cancer Paternal Grandmother   . Cancer Other        runs on both sides of family    Social History: Social History  Substance Use Topics  . Smoking status: Current Every Day Smoker    Packs/day: 1.00    Years: 12.00    Types: Cigarettes  . Smokeless tobacco: Never Used  . Alcohol use Yes     Comment: daily    Allergies: No Known Allergies    Current Outpatient Prescriptions:  .  etonogestrel (NEXPLANON) 68 MG IMPL implant, 1 each by Subdermal route once., Disp: , Rfl:  .  pseudoephedrine-acetaminophen (TYLENOL SINUS) 30-500 MG TABS tablet, Take 1 tablet by mouth every 4 (four) hours as needed., Disp: , Rfl:  .  megestrol (MEGACE) 40 MG tablet, Take 3/day (at the same time) for 5 days; 2/day for 5 days, then 1/day PO prn bleeding, Disp: 60 tablet, Rfl: 3  History of Present Illness: Here for pap and physical. Last pap 10/15 with ASCUS/- HPV (normal). [facility].  Bleeds sometimes.  Bled last night,,has a pain, mainly in left side, that "vibrates" only when bleeding.  Ovarian cyst?  Uterine  cramping?  Muscle spasms?  .    Review of Systems   Patient denies any headaches, blurred vision, shortness of breath, chest pain, abdominal pain, problems with bowel movements, urination, or intercourse.   Physical Exam: General:  Well developed, well nourished, no acute distress Skin:  Warm and dry Neck:  Midline trachea, normal thyroid Lungs; Clear to auscultation bilaterally Breast:  No dominant palpable mass, retraction, or nipple discharge Cardiovascular: Regular rate and rhythm Abdomen:  Soft, non tender, no hepatosplenomegaly Pelvic:  External genitalia is normal in appearance.  The vagina is normal in appearance.  The cervix is bulbous.  Uterus is felt to be normal size, shape, and contour.  No adnexal masses or tenderness noted.  Extremities:  No swelling or varicosities noted Psych:  No mood changes.     Impression: normal gyn exam  LLQ pain, intermittent X 1 month   Plan: if pap neg, repeat q 3 years  Pelvic US Megace prn bleeding

## 2017-03-16 LAB — CYTOLOGY - PAP
Adequacy: ABSENT
Chlamydia: NEGATIVE
Diagnosis: NEGATIVE
HPV (WINDOPATH): NOT DETECTED
NEISSERIA GONORRHEA: NEGATIVE

## 2017-03-24 ENCOUNTER — Other Ambulatory Visit: Payer: Self-pay | Admitting: Advanced Practice Midwife

## 2017-03-24 DIAGNOSIS — R1032 Left lower quadrant pain: Secondary | ICD-10-CM

## 2017-03-25 ENCOUNTER — Ambulatory Visit: Payer: Medicaid Other | Admitting: Advanced Practice Midwife

## 2017-03-25 ENCOUNTER — Other Ambulatory Visit: Payer: Medicaid Other

## 2017-03-25 ENCOUNTER — Encounter: Payer: Self-pay | Admitting: Advanced Practice Midwife

## 2018-03-09 ENCOUNTER — Encounter (HOSPITAL_COMMUNITY): Payer: Self-pay | Admitting: *Deleted

## 2018-03-09 ENCOUNTER — Other Ambulatory Visit: Payer: Self-pay

## 2018-03-09 ENCOUNTER — Emergency Department (HOSPITAL_COMMUNITY)
Admission: EM | Admit: 2018-03-09 | Discharge: 2018-03-09 | Disposition: A | Discharge status: Home / Self Care | Payer: Medicaid Other | Attending: Emergency Medicine | Admitting: Emergency Medicine

## 2018-03-09 ENCOUNTER — Emergency Department (HOSPITAL_COMMUNITY): Payer: Medicaid Other

## 2018-03-09 DIAGNOSIS — R1013 Epigastric pain: Secondary | ICD-10-CM | POA: Insufficient documentation

## 2018-03-09 DIAGNOSIS — Z86718 Personal history of other venous thrombosis and embolism: Secondary | ICD-10-CM | POA: Diagnosis not present

## 2018-03-09 DIAGNOSIS — Z79899 Other long term (current) drug therapy: Secondary | ICD-10-CM | POA: Diagnosis not present

## 2018-03-09 DIAGNOSIS — R1011 Right upper quadrant pain: Secondary | ICD-10-CM | POA: Diagnosis not present

## 2018-03-09 DIAGNOSIS — F17210 Nicotine dependence, cigarettes, uncomplicated: Secondary | ICD-10-CM | POA: Diagnosis not present

## 2018-03-09 LAB — COMPREHENSIVE METABOLIC PANEL
ALBUMIN: 4 g/dL (ref 3.5–5.0)
ALK PHOS: 68 U/L (ref 38–126)
ALT: 55 U/L — ABNORMAL HIGH (ref 0–44)
ANION GAP: 9 (ref 5–15)
AST: 39 U/L (ref 15–41)
BILIRUBIN TOTAL: 1 mg/dL (ref 0.3–1.2)
BUN: 9 mg/dL (ref 6–20)
CALCIUM: 9 mg/dL (ref 8.9–10.3)
CO2: 21 mmol/L — AB (ref 22–32)
CREATININE: 1.02 mg/dL — AB (ref 0.44–1.00)
Chloride: 109 mmol/L (ref 98–111)
GFR calc Af Amer: >60 mL/min (ref >60)
GFR calc non Af Amer: >60 mL/min (ref >60)
GLUCOSE: 123 mg/dL — AB (ref 70–99)
Potassium: 3.3 mmol/L — ABNORMAL LOW (ref 3.5–5.1)
SODIUM: 139 mmol/L (ref 135–145)
TOTAL PROTEIN: 7.2 g/dL (ref 6.5–8.1)

## 2018-03-09 LAB — CBC
HCT: 41.6 % (ref 36.0–46.0)
HEMOGLOBIN: 13.9 g/dL (ref 12.0–15.0)
MCH: 31.9 pg (ref 26.0–34.0)
MCHC: 33.4 g/dL (ref 30.0–36.0)
MCV: 95.4 fL (ref 78.0–100.0)
PLATELETS: 290 10*3/uL (ref 150–400)
RBC: 4.36 MIL/uL (ref 3.87–5.11)
RDW: 13 % (ref 11.5–15.5)
WBC: 5.9 10*3/uL (ref 4.0–10.5)

## 2018-03-09 LAB — URINALYSIS, ROUTINE W REFLEX MICROSCOPIC
Bilirubin Urine: NEGATIVE
GLUCOSE, UA: NEGATIVE mg/dL
HGB URINE DIPSTICK: NEGATIVE
Ketones, ur: NEGATIVE mg/dL
LEUKOCYTES UA: NEGATIVE
Nitrite: NEGATIVE
PH: 5 (ref 5.0–8.0)
PROTEIN: NEGATIVE mg/dL
SPECIFIC GRAVITY, URINE: 1.028 (ref 1.005–1.030)

## 2018-03-09 LAB — LIPASE, BLOOD: Lipase: 26 U/L (ref 11–51)

## 2018-03-09 LAB — PREGNANCY, URINE: Preg Test, Ur: NEGATIVE

## 2018-03-09 MED ORDER — ONDANSETRON 8 MG PO TBDP: 8.0000 mg | ORAL_TABLET | Freq: Three times a day (TID) | ORAL | 0 refills | Status: DC | PRN

## 2018-03-09 MED ORDER — MORPHINE SULFATE (PF) 4 MG/ML IV SOLN
4.0000 mg | Freq: Once | INTRAVENOUS | Status: AC
  Administered 2018-03-09: 4 mg via INTRAVENOUS
  Filled 2018-03-09: qty 1

## 2018-03-09 MED ORDER — FAMOTIDINE IN NACL 20-0.9 MG/50ML-% IV SOLN
20.0000 mg | Freq: Once | INTRAVENOUS | Status: AC
  Administered 2018-03-09: 20 mg via INTRAVENOUS
  Filled 2018-03-09: qty 50

## 2018-03-09 MED ORDER — OMEPRAZOLE 20 MG PO CPDR: 20.0000 mg | DELAYED_RELEASE_CAPSULE | Freq: Every day | ORAL | 0 refills | Status: DC

## 2018-03-09 MED ORDER — ONDANSETRON HCL 4 MG/2ML IJ SOLN
4.0000 mg | Freq: Once | INTRAMUSCULAR | Status: AC
  Administered 2018-03-09: 4 mg via INTRAVENOUS
  Filled 2018-03-09: qty 2

## 2018-03-09 NOTE — ED Provider Notes (Signed)
[facility] EMERGENCY DEPARTMENT Provider Note   CSN: [id number] Arrival date & time: 03/09/18  1032     History   Chief Complaint Chief Complaint  Patient presents with  . Abdominal Pain    HPI Holly Hodge is a 32 y.o. female.  The history is provided by the patient.  Abdominal Pain   This is a new problem. The current episode started 12 to 24 hours ago. The problem occurs constantly. The problem has not changed since onset.The pain is located in the epigastric region and RUQ. The quality of the pain is aching. Pain severity now: moderate to severe. Associated symptoms include nausea. Pertinent negatives include fever, vomiting, constipation and dysuria. Nothing aggravates the symptoms. Nothing relieves the symptoms.  Pt has had prior gallbladder surgery.  She feels naueated but has not vomited.  She has not had a BM today.  No bloating.  Past Medical History:  Diagnosis Date  . Anxiety   . Irregular intermenstrual bleeding 06/12/2014  . 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/11/2012  . PE (pulmonary embolism) 2013  . Vaginal discharge 08/09/2014  . Vaginal irritation 12/24/2014  . Yeast infection 12/24/2014    Patient Active Problem List   Diagnosis Date Noted  . Vaginal irritation 12/24/2014  . Yeast infection 12/24/2014  . Nexplanon insertion 12/06/2014  . Vaginal discharge 08/09/2014  . Irregular intermenstrual bleeding 06/12/2014  . Abnormal Pap smear of cervix 05/21/2014  . BV (bacterial vaginosis) 04/10/2014  . Post-operative state 02/26/2014  . S/P cesarean section 02/14/2014  . 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/11/2012  . Nausea and vomiting 03/09/2012  . 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/08/2012  . Tobacco abuse 03/08/2012  . Obesity 03/08/2012  . Menorrhagia 03/08/2012    Past Surgical History:  Procedure Laterality Date  . CESAREAN SECTION     x2  . CESAREAN SECTION N/A 02/14/2014   Procedure: CESAREAN SECTION;  Surgeon: Adam Phenix, MD;  Location:  [facility];  Service: Obstetrics;  Laterality: N/A;  . CHOLECYSTECTOMY  2005  . WISDOM TOOTH EXTRACTION       OB History    Gravida  3   Para  3   Term  3   Preterm      AB      Living  3     SAB      TAB      Ectopic      Multiple      Live Births  3            Home Medications    Prior to Admission medications   Medication Sig Start Date End Date Taking? Authorizing Provider  pseudoephedrine-acetaminophen (TYLENOL SINUS) 30-500 MG TABS tablet Take 1 tablet by mouth every 4 (four) hours as needed.   Yes [provider]  etonogestrel (NEXPLANON) 68 MG IMPL implant 1 each by Subdermal route once.    [provider]  megestrol (MEGACE) 40 MG tablet Take 3/day (at the same time) for 5 days; 2/day for 5 days, then 1/day PO prn bleeding 03/11/17   Cresenzo-Dishmon, Scarlette Calico, CNM  omeprazole (PRILOSEC) 20 MG capsule Take 1 capsule (20 mg total) by mouth daily. 03/09/18   Linwood Dibbles, MD  ondansetron (ZOFRAN ODT) 8 MG disintegrating tablet Take 1 tablet (8 mg total) by mouth every 8 (eight) hours as needed for nausea or vomiting. 03/09/18   Linwood Dibbles, MD    Family History Family History  Problem Relation Age of Onset  . Hypertension Mother   .  Hypertension Father   . Stroke Father   . Cancer Paternal Grandmother   . Cancer Other        runs on both sides of family    Social History Social History   Tobacco Use  . Smoking status: Current Every Day Smoker    Packs/day: 0.50    Years: 12.00    Pack years: 6.00    Types: Cigarettes  . Smokeless tobacco: Never Used  Substance Use Topics  . Alcohol use: Yes    Comment: daily  . Drug use: Yes    Types: Marijuana    Comment: 3-4 days a week     Allergies   Patient has no known allergies.   Review of Systems Review of Systems  Constitutional: Negative for fever.  Respiratory: Negative for cough and shortness of breath.   Cardiovascular: Negative for chest pain.  Gastrointestinal: Positive  for abdominal pain and nausea. Negative for constipation and vomiting.  Genitourinary: Negative for dysuria.  All other systems reviewed and are negative.    Physical Exam Updated Vital Signs BP (!) 153/88 (BP Location: Right Arm)   Pulse 86   Temp 98 F (36.7 C) (Oral)   Resp 18   Ht 1.676 m (5\' 6" )   Wt 104.3 kg   SpO2 100%   BMI 37.12 kg/m   Physical Exam  Constitutional: She appears well-developed and well-nourished. No distress.  HENT:  Head: Normocephalic and atraumatic.  Right Ear: External ear normal.  Left Ear: External ear normal.  Eyes: Conjunctivae are normal. Right eye exhibits no discharge. Left eye exhibits no discharge. No scleral icterus.  Neck: Neck supple. No tracheal deviation present.  Cardiovascular: Normal rate, regular rhythm and intact distal pulses.  Pulmonary/Chest: Effort normal and breath sounds normal. No stridor. No respiratory distress. She has no wheezes. She has no rales.  Abdominal: Soft. Bowel sounds are normal. She exhibits no distension. There is tenderness in the right upper quadrant and epigastric area. There is no rebound and no guarding.  Musculoskeletal: She exhibits no edema or tenderness.  Neurological: She is alert. She has normal strength. No cranial nerve deficit (no facial droop, extraocular movements intact, no slurred speech) or sensory deficit. She exhibits normal muscle tone. She displays no seizure activity. Coordination normal.  Skin: Skin is warm and dry. No rash noted.  Psychiatric: She has a normal mood and affect.  Nursing note and vitals reviewed.    ED Treatments / Results  Labs (all labs ordered are listed, but only abnormal results are displayed) Labs Reviewed  COMPREHENSIVE METABOLIC PANEL - Abnormal; Notable for the following components:      Result Value   Potassium 3.3 (*)    CO2 21 (*)    Glucose, Bld 123 (*)    Creatinine, Ser 1.02 (*)    ALT 55 (*)    All other components within normal limits    URINALYSIS, ROUTINE W REFLEX MICROSCOPIC - Abnormal; Notable for the following components:   APPearance CLOUDY (*)    All other components within normal limits  LIPASE, BLOOD  CBC  PREGNANCY, URINE    EKG None  Radiology Dg Abdomen Acute W/chest  Result Date: 03/09/2018 CLINICAL DATA:  Right upper quadrant abdominal pain and nausea since last night. EXAM: DG ABDOMEN ACUTE W/ 1V CHEST COMPARISON:  09/11/2016 FINDINGS: There is no evidence of dilated bowel loops or free intraperitoneal air. No radiopaque calculi or other significant radiographic abnormality is seen. Status post cholecystectomy. Heart size  and mediastinal contours are within normal limits. Both lungs are clear. IMPRESSION: Negative abdominal radiographs.  No acute cardiopulmonary disease. Electronically Signed   By: Amie Portland M.D.   On: 03/09/2018 13:14    Procedures Procedures (including critical care time)  Medications Ordered in ED Medications  famotidine (PEPCID) IVPB 20 mg premix (0 mg Intravenous Stopped 03/09/18 1303)  morphine 4 MG/ML injection 4 mg (4 mg Intravenous Given 03/09/18 1234)  ondansetron (ZOFRAN) injection 4 mg (4 mg Intravenous Given 03/09/18 1234)     Initial Impression / Assessment and Plan / ED Course  I have reviewed the triage vital signs and the nursing notes.  Pertinent labs & imaging results that were available during my care of the patient were reviewed by me and considered in my medical decision making (see chart for details).  Clinical Course as of Mar 10 1419  Wed Mar 09, 2018  1419 Labs are all normal.   [JK]  1419 Abdominal series without signs of obstruction   [JK]    Clinical Course User Index [JK] Linwood Dibbles, MD   She presented with upper abdominal pain.  She has had a cholecystectomy before.  No signs of hepatitis, pancreatitis or biliary obstruction.  Patient's plain film x-rays are also negative for any signs of obstruction and the patient has not had any vomiting.   Unclear etiology of her abdominal pain but I have a low suspicion for any acute vascular emergencies, infection, or obstruction.  We will try a course of antacids and antinausea medications.  Patient instructed to return to the ED in 24 hours if her symptoms are worsening to consider abdominal CT scanning.  Patient also understands to return sooner for any worsening symptoms otherwise follow-up with her primary care doctor. Final Clinical Impressions(s) / ED Diagnoses   Final diagnoses:  Epigastric pain    ED Discharge Orders         Ordered    omeprazole (PRILOSEC) 20 MG capsule  Daily     03/09/18 1418    ondansetron (ZOFRAN ODT) 8 MG disintegrating tablet  Every 8 hours PRN     03/09/18 1418           Linwood Dibbles, MD 03/09/18 1420

## 2018-03-09 NOTE — ED Triage Notes (Signed)
Pt c/o abdominal pain that started last night; pt has some complaints of nausea; pt states the pain is mostly on the right upper quaudrant

## 2018-03-09 NOTE — Discharge Instructions (Signed)
Take over-the-counter medications for pain, take the omeprazole and Zofran to see if that helps with your abdominal discomfort, return to the emergency room for worsening symptoms fever vomiting, follow-up with primary care doctor later this week or early next week if symptoms have not completely resolved

## 2018-04-12 ENCOUNTER — Other Ambulatory Visit: Payer: Medicaid Other | Admitting: Advanced Practice Midwife

## 2018-04-13 ENCOUNTER — Other Ambulatory Visit: Payer: Medicaid Other | Admitting: Advanced Practice Midwife

## 2018-04-13 ENCOUNTER — Encounter: Payer: Self-pay | Admitting: *Deleted

## 2018-04-27 ENCOUNTER — Other Ambulatory Visit: Payer: Self-pay

## 2018-04-27 ENCOUNTER — Ambulatory Visit: Payer: Medicaid Other | Admitting: Adult Health

## 2018-04-27 ENCOUNTER — Other Ambulatory Visit: Payer: Medicaid Other | Admitting: Advanced Practice Midwife

## 2018-04-27 ENCOUNTER — Encounter: Payer: Self-pay | Admitting: Adult Health

## 2018-04-27 VITALS — BP 112/72 | HR 69 | Ht 66.0 in | Wt 242.0 lb

## 2018-04-27 DIAGNOSIS — Z309 Encounter for contraceptive management, unspecified: Secondary | ICD-10-CM

## 2018-04-27 DIAGNOSIS — Z01419 Encounter for gynecological examination (general) (routine) without abnormal findings: Secondary | ICD-10-CM

## 2018-04-27 DIAGNOSIS — Z975 Presence of (intrauterine) contraceptive device: Secondary | ICD-10-CM | POA: Diagnosis not present

## 2018-04-27 DIAGNOSIS — F419 Anxiety disorder, unspecified: Secondary | ICD-10-CM | POA: Diagnosis not present

## 2018-04-27 DIAGNOSIS — F329 Major depressive disorder, single episode, unspecified: Secondary | ICD-10-CM

## 2018-04-27 DIAGNOSIS — F32A Depression, unspecified: Secondary | ICD-10-CM | POA: Insufficient documentation

## 2018-04-27 MED ORDER — ESCITALOPRAM OXALATE 10 MG PO TABS: 10.0000 mg | ORAL_TABLET | Freq: Every day | ORAL | 6 refills | Status: DC
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
## 2018-05-02 ENCOUNTER — Other Ambulatory Visit: Payer: Self-pay | Admitting: Adult Health

## 2018-05-02 MED ORDER — ESCITALOPRAM OXALATE 10 MG PO TABS: 10.0000 mg | ORAL_TABLET | Freq: Every day | ORAL | 6 refills | Status: DC

## 2018-05-02 NOTE — Progress Notes (Signed)
rx lexapro to [facility] in [place]

## 2018-05-03 ENCOUNTER — Other Ambulatory Visit: Payer: Self-pay

## 2018-05-03 ENCOUNTER — Ambulatory Visit (INDEPENDENT_AMBULATORY_CARE_PROVIDER_SITE_OTHER): Payer: Medicaid Other | Admitting: Adult Health

## 2018-05-03 ENCOUNTER — Encounter: Payer: Self-pay | Admitting: Adult Health

## 2018-05-03 VITALS — BP 122/69 | HR 76 | Ht 66.0 in | Wt 244.0 lb

## 2018-05-03 DIAGNOSIS — Z3202 Encounter for pregnancy test, result negative: Secondary | ICD-10-CM

## 2018-05-03 DIAGNOSIS — Z3049 Encounter for surveillance of other contraceptives: Secondary | ICD-10-CM

## 2018-05-03 DIAGNOSIS — Z3046 Encounter for surveillance of implantable subdermal contraceptive: Secondary | ICD-10-CM

## 2018-05-03 DIAGNOSIS — Z30017 Encounter for initial prescription of implantable subdermal contraceptive: Secondary | ICD-10-CM

## 2018-05-03 LAB — POCT URINE PREGNANCY: PREG TEST UR: NEGATIVE

## 2018-05-03 MED ORDER — ETONOGESTREL 68 MG ~~LOC~~ IMPL
68.0000 mg | DRUG_IMPLANT | Freq: Once | SUBCUTANEOUS | Status: AC
  Administered 2018-05-03: 68 mg via SUBCUTANEOUS

## 2018-05-03 NOTE — Progress Notes (Signed)
  Subjective:     Patient ID: Holly Hodge, female   DOB: 11/16/1985, 32 y.o.   MRN: [id number]  HPI Bliss is a 32 year old black female in for nexplanon removal and reinsertion.   Review of Systems For nexplanon removal and reinsertion Reviewed past medical,surgical, social and family history. Reviewed medications and allergies.     Objective:   Physical Exam BP 122/69 (BP Location: Right Arm, Patient Position: Sitting, Cuff Size: Normal)   Pulse 76   Ht 5\' 6"  (1.676 m)   Wt 244 lb (110.7 kg)   BMI 39.38 kg/m UPT negative. Consent signed, time out called. Left arm cleansed with betadine, and injected with 1.5 cc 1% lidocaine and waited til numb.Under sterile technique a #11 blade was used to make small vertical incision, and a curved forceps was used to easily remove rod, and nexplanon easily inserted and palpated by provider and pt. Steri strips applied. Pressure dressing applied.    Assessment:     1. Encounter for Nexplanon removal   2. Nexplanon insertion   3. Negative pregnancy test   Lot#   [id number]                         Exp: 10/27/20  Plan:     Use condoms x 2 weeks, keep clean and dry x 24 hours, no heavy lifting, keep steri strips on x 72 hours, Keep pressure dressing on x 24 hours. Follow up prn problems. Removal in 3 years

## 2018-05-03 NOTE — Patient Instructions (Signed)
Use condoms x 2 weeks, keep clean and dry x 24 hours, no heavy lifting, keep steri strips on x 72 hours, Keep pressure dressing on x 24 hours. Follow up prn problems.  

## 2018-05-04 ENCOUNTER — Telehealth: Payer: Self-pay | Admitting: *Deleted

## 2018-05-04 NOTE — Telephone Encounter (Signed)
Called patient and advised that she keep area clean and dry for 24 hours. Try to keep strips on for 72 hours. Patient voiced understanding and has no further questions at this time.

## 2018-06-08 ENCOUNTER — Ambulatory Visit: Payer: Medicaid Other | Admitting: Adult Health

## 2018-06-16 ENCOUNTER — Encounter: Payer: Self-pay | Admitting: *Deleted

## 2018-06-16 ENCOUNTER — Ambulatory Visit: Payer: Medicaid Other | Admitting: Adult Health

## 2019-03-16 ENCOUNTER — Emergency Department (HOSPITAL_COMMUNITY)
Admission: EM | Admit: 2019-03-16 | Discharge: 2019-03-16 | Disposition: A | Discharge status: Home / Self Care | Payer: Medicaid Other | Attending: Emergency Medicine | Admitting: Emergency Medicine

## 2019-03-16 ENCOUNTER — Emergency Department (HOSPITAL_COMMUNITY): Payer: Medicaid Other

## 2019-03-16 ENCOUNTER — Encounter (HOSPITAL_COMMUNITY): Payer: Self-pay

## 2019-03-16 DIAGNOSIS — M7989 Other specified soft tissue disorders: Secondary | ICD-10-CM | POA: Insufficient documentation

## 2019-03-16 DIAGNOSIS — F1210 Cannabis abuse, uncomplicated: Secondary | ICD-10-CM | POA: Diagnosis not present

## 2019-03-16 DIAGNOSIS — Z86711 Personal history of pulmonary embolism: Secondary | ICD-10-CM | POA: Diagnosis not present

## 2019-03-16 DIAGNOSIS — M79601 Pain in right arm: Secondary | ICD-10-CM | POA: Diagnosis not present

## 2019-03-16 DIAGNOSIS — L930 Discoid lupus erythematosus: Secondary | ICD-10-CM | POA: Diagnosis not present

## 2019-03-16 DIAGNOSIS — F17210 Nicotine dependence, cigarettes, uncomplicated: Secondary | ICD-10-CM | POA: Insufficient documentation

## 2019-03-16 DIAGNOSIS — M79641 Pain in right hand: Secondary | ICD-10-CM | POA: Diagnosis present

## 2019-03-16 NOTE — ED Provider Notes (Signed)
Emergency Department Provider Note   I have reviewed the triage vital signs and the nursing notes.   HISTORY  Chief Complaint Hand Pain   HPI Holly Hodge is a 33 y.o. female with PMH of Lupus anticoagulant and PE, not anticoagulated, presents to the emergency department for evaluation of right hand/wrist pain and swelling.  Symptoms began without a traumatic event.  She noticed initial symptoms about 10 days ago.  She has had swelling into the right hand and has difficulty closing her hand to make a fist because of swelling.  Denies pain in the elbow or shoulder.  No neck pain.  No tingling or numbness.  Denies rash, fever.  No pain with moving the wrist.  Patient states that in 2010 she was diagnosed with PE and bridged with Lovenox to Coumadin.  She stopped taking the Coumadin approximately 1 year later because she felt like she was improved.   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   PE (pulmonary embolism) 2013   Vaginal discharge 08/09/2014   Vaginal irritation 12/24/2014   Yeast infection 12/24/2014    Patient Active Problem List   Diagnosis Date Noted   Nexplanon in place 04/27/2018   Anxiety and depression 04/27/2018   Encounter for well woman exam with routine gynecological exam 04/27/2018   Vaginal irritation 12/24/2014   Yeast infection 12/24/2014   Nexplanon insertion 12/06/2014   Vaginal discharge 08/09/2014   Irregular intermenstrual bleeding 06/12/2014   Abnormal Pap smear of cervix 05/21/2014   BV (bacterial vaginosis) 04/10/2014   Post-operative state 02/26/2014   S/P cesarean section 02/14/2014   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/11/2012   Nausea and vomiting 03/09/2012   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/08/2012   Tobacco abuse 03/08/2012   Obesity 03/08/2012   Menorrhagia 03/08/2012    Past Surgical History:  Procedure  Laterality Date   CESAREAN SECTION     x2   CESAREAN SECTION N/A 02/14/2014   Procedure: CESAREAN SECTION;  Surgeon: Adam Phenix, MD;  Location: [facility];  Service: Obstetrics;  Laterality: N/A;   CHOLECYSTECTOMY  2005   WISDOM TOOTH EXTRACTION      Allergies Patient has no known allergies.  Family History  Problem Relation Age of Onset   Hypertension Mother    Hypertension Father    Stroke Father    Cancer Paternal Grandmother    Cancer Other        runs on both sides of family    Social History Social History   Tobacco Use   Smoking status: Current Every Day Smoker    Packs/day: 0.50    Years: 12.00    Pack years: 6.00    Types: Cigarettes   Smokeless tobacco: Never Used  Substance Use Topics   Alcohol use: Yes    Comment: daily   Drug use: Yes    Types: Marijuana    Comment: 3-4 days a week    Review of Systems  Constitutional: No fever/chills Eyes: No visual changes. ENT: No sore throat. Cardiovascular: Denies chest pain. Respiratory: Denies shortness of breath. Gastrointestinal: No abdominal pain.  No nausea, no vomiting.  No diarrhea.  No constipation. Genitourinary: Negative for dysuria. Musculoskeletal: Negative for back pain. Positive right hand/forearm pain and swelling.  Skin: Negative for rash. Neurological: Negative for headaches, focal weakness or numbness.  10-point ROS otherwise negative.  ____________________________________________   PHYSICAL EXAM:  VITAL  SIGNS: ED Triage Vitals [03/16/19 0705]  Enc Vitals Group     BP 140/85     Pulse Rate 76     Resp 12     Temp 98.6 F (37 C)     Temp Source Oral     SpO2 95 %     Weight 242 lb (109.8 kg)     Height 5\' 5"  (1.651 m)   Constitutional: Alert and oriented. Well appearing and in no acute distress. Eyes: Conjunctivae are normal. Head: Atraumatic. Nose: No congestion/rhinnorhea. Mouth/Throat: Mucous membranes are moist.  Neck: No stridor.  Cardiovascular: Normal  rate, regular rhythm. Good peripheral circulation. Grossly normal heart sounds.   Respiratory: Normal respiratory effort.  Gastrointestinal: No distention.  Musculoskeletal: Normal range of motion of the right wrist without warmth, redness.  The right hand is diffusely swollen with some mild tenderness in the distal forearm.  No overlying cellulitis.  Normal range of motion of the elbow and shoulder. Neurologic:  Normal speech and language. No gross focal neurologic deficits are appreciated.  Skin:  Skin is warm, dry and intact. No rash noted.  ____________________________________________  RADIOLOGY  [place] Venous Img Upper Right (dvt Study)  Result Date: 03/16/2019 CLINICAL DATA:  33 year old female with right arm and hand swelling EXAM: RIGHT UPPER EXTREMITY VENOUS DOPPLER ULTRASOUND TECHNIQUE: Gray-scale sonography with graded compression, as well as color Doppler and duplex ultrasound were performed to evaluate the upper extremity deep venous system from the level of the subclavian vein and including the jugular, axillary, basilic, radial, ulnar and upper cephalic vein. Spectral Doppler was utilized to evaluate flow at rest and with distal augmentation maneuvers. COMPARISON:  None. FINDINGS: Contralateral Subclavian Vein: Respiratory phasicity is normal and symmetric with the symptomatic side. No evidence of thrombus. Normal compressibility. Internal Jugular Vein: No evidence of thrombus. Normal compressibility, respiratory phasicity and response to augmentation. Subclavian Vein: No evidence of thrombus. Normal compressibility, respiratory phasicity and response to augmentation. Axillary Vein: No evidence of thrombus. Normal compressibility, respiratory phasicity and response to augmentation. Cephalic Vein: No evidence of thrombus. Normal compressibility, respiratory phasicity and response to augmentation. Basilic Vein: No evidence of thrombus. Normal compressibility, respiratory phasicity and response  to augmentation. Brachial Veins: No evidence of thrombus. Normal compressibility, respiratory phasicity and response to augmentation. Radial Veins: No evidence of thrombus. Normal compressibility, respiratory phasicity and response to augmentation. Ulnar Veins: No evidence of thrombus. Normal compressibility, respiratory phasicity and response to augmentation. Other Findings:  None visualized. IMPRESSION: Sonographic survey of the right upper extremity negative for DVT Electronically Signed   By: Gilmer MorJaime  Wagner D.O.   On: 03/16/2019 10:39    ____________________________________________   PROCEDURES  Procedure(s) performed:   Procedures  None  ____________________________________________   INITIAL IMPRESSION / ASSESSMENT AND PLAN / ED COURSE  Pertinent labs & imaging results that were available during my care of the patient were reviewed by me and considered in my medical decision making (see chart for details).   Patient with history of lupus anticoagulant and PE, not on anticoagulation, presents to the emergency department with atraumatic right forearm and hand swelling.  No evidence of cellulitis or clinical concern for septic arthritis.  No bony tenderness on examination to prompt x-ray.  Given the patient's history, plan for right upper extremity DVT study.  If negative, consider wrist splinting for comfort and NSAIDs.   DVT study is negative. Splint provided for support/comfort. NSAIDs at home and PCP follow up plan discussed.  ____________________________________________  FINAL CLINICAL IMPRESSION(S) /  ED DIAGNOSES  Final diagnoses:  Right arm pain  Swelling of right hand    Note:  This document was prepared using Dragon voice recognition software and may include unintentional dictation errors.  Nanda Quinton, MD Emergency Medicine    Clevon Khader, [facility], MD 03/17/19 [phone]

## 2019-03-16 NOTE — Discharge Instructions (Signed)
You were seen in the emergency department today with right arm pain and some hand swelling.  You do not have evidence of blood clot in the arm.  This may be from muscle strain.  Please use the splint as needed for comfort.  You should remove the splint and move your wrist frequently.  Call your primary care physician to reestablish care and discuss whether or not she need to be on anticoagulation.

## 2019-03-16 NOTE — ED Triage Notes (Signed)
Pt complaining of right hand and wrist pain. Slight swelling and pain for the last week with no relief. Unable to fully make a fist.

## 2019-03-17 DIAGNOSIS — G5601 Carpal tunnel syndrome, right upper limb: Secondary | ICD-10-CM | POA: Diagnosis not present

## 2019-03-17 DIAGNOSIS — M67833 Other specified disorders of tendon, right wrist: Secondary | ICD-10-CM | POA: Diagnosis not present

## 2019-03-24 ENCOUNTER — Telehealth: Payer: Self-pay | Admitting: Women's Health

## 2019-03-24 NOTE — Telephone Encounter (Signed)
Called patient regarding appointment scheduled in our office encouraged to come alone to the visit if possible, however, a support person, over age 34, may accompany her  to appointment if assistance is needed for safety or care concerns. Otherwise, support persons should remain outside until the visit is complete.  ° °We ask if you have had any exposure to anyone suspected or confirmed of having COVID-19 or if you are experiencing any of the following, to call and reschedule your appointment: fever, cough, shortness of breath, muscle pain, diarrhea, rash, vomiting, abdominal pain, red eye, weakness, bruising, bleeding, joint pain, or a severe headache.  ° °Please know we will ask you these questions or similar questions when you arrive for your appointment and again it’s how we are keeping everyone safe.   ° °Also,to keep you safe, please use the provided hand sanitizer when you enter the office. We are asking everyone in the office to wear a mask to help prevent the spread of °germs. If you have a mask of your own, please wear it to your appointment, if not, we are happy to provide one for you. ° °Thank you for understanding and your cooperation.  ° ° °[facility] Staff ° ° ° °

## 2019-03-27 ENCOUNTER — Encounter: Payer: Self-pay | Admitting: Advanced Practice Midwife

## 2019-03-27 ENCOUNTER — Other Ambulatory Visit: Payer: Self-pay

## 2019-03-27 ENCOUNTER — Ambulatory Visit (INDEPENDENT_AMBULATORY_CARE_PROVIDER_SITE_OTHER): Payer: Medicaid Other | Admitting: Advanced Practice Midwife

## 2019-03-27 ENCOUNTER — Ambulatory Visit (INDEPENDENT_AMBULATORY_CARE_PROVIDER_SITE_OTHER): Payer: Medicaid Other | Admitting: *Deleted

## 2019-03-27 VITALS — BP 121/75 | HR 81 | Ht 65.0 in | Wt 244.0 lb

## 2019-03-27 DIAGNOSIS — Z3202 Encounter for pregnancy test, result negative: Secondary | ICD-10-CM | POA: Diagnosis not present

## 2019-03-27 DIAGNOSIS — Z3042 Encounter for surveillance of injectable contraceptive: Secondary | ICD-10-CM

## 2019-03-27 DIAGNOSIS — Z3046 Encounter for surveillance of implantable subdermal contraceptive: Secondary | ICD-10-CM

## 2019-03-27 LAB — POCT URINE PREGNANCY: Preg Test, Ur: NEGATIVE

## 2019-03-27 MED ORDER — MEDROXYPROGESTERONE ACETATE 150 MG/ML IM SUSP
150.0000 mg | Freq: Once | INTRAMUSCULAR | Status: AC
  Administered 2019-03-27: 150 mg via INTRAMUSCULAR

## 2019-03-27 MED ORDER — MEDROXYPROGESTERONE ACETATE 150 MG/ML IM SUSP: 150.0000 mg | INTRAMUSCULAR | 3 refills | Status: DC

## 2019-03-27 NOTE — Progress Notes (Signed)
HPI:  Holly Hodge 33 y.o. here for Nexplanon removal.  Her future plans for birth control are depo. This was her 3rd Nexplanon, but it has been making her arm feel weird.  Offered to remove it and put a new one in a different spot, wants to go w/depo.   Marland Kitchen  Past Medical History: Past Medical History:  Diagnosis Date  . Anxiety   . Irregular intermenstrual bleeding 06/12/2014  . 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/11/2012  . PE (pulmonary embolism) 2013  . Vaginal discharge 08/09/2014  . Vaginal irritation 12/24/2014  . Yeast infection 12/24/2014    Past Surgical History: Past Surgical History:  Procedure Laterality Date  . CESAREAN SECTION     x2  . CESAREAN SECTION N/A 02/14/2014   Procedure: CESAREAN SECTION;  Surgeon: Woodroe Mode, MD;  Location: [facility];  Service: Obstetrics;  Laterality: N/A;  . CHOLECYSTECTOMY  2005  . WISDOM TOOTH EXTRACTION      Family History: Family History  Problem Relation Age of Onset  . Hypertension Mother   . Hypertension Father   . Stroke Father   . Cancer Paternal Grandmother   . Cancer Other        runs on both sides of family    Social History: Social History   Tobacco Use  . Smoking status: Current Every Day Smoker    Packs/day: 0.50    Years: 12.00    Pack years: 6.00    Types: Cigarettes  . Smokeless tobacco: Never Used  Substance Use Topics  . Alcohol use: Yes    Comment: daily  . Drug use: Yes    Types: Marijuana    Comment: 3-4 days a week    Allergies: No Known Allergies  Meds: (Not in a hospital admission)     Patient given informed consent for removal of her Nexplanon, time out was performed.  Signed copy in the chart.  Appropriate time out taken. Implanon site identified.  Area prepped in usual sterile fashon. One cc of 1% lidocaine was used to anesthetize the area at the distal end of the implant. A small stab incision was made right beside the implant on the distal portion.  The Nexplanon rod was grasped using hemostats  and removed without difficulty.  There was less than 3 cc blood loss. There were no complications.  A small amount of antibiotic ointment and steri-strips were applied over the small incision.  A pressure bandage was applied to reduce any bruising.  The patient tolerated the procedure well and was given post procedure instructions.

## 2019-03-27 NOTE — Progress Notes (Signed)
Pt in for depo. Given IM in right deltoid. Patient tolerated well. Next dose in 12 weeks

## 2019-04-05 ENCOUNTER — Other Ambulatory Visit: Payer: Self-pay

## 2019-04-05 DIAGNOSIS — Z20822 Contact with and (suspected) exposure to covid-19: Secondary | ICD-10-CM

## 2019-04-08 LAB — NOVEL CORONAVIRUS, NAA: SARS-CoV-2, NAA: NOT DETECTED

## 2019-06-12 ENCOUNTER — Telehealth: Payer: Self-pay | Admitting: Adult Health

## 2019-06-12 NOTE — Telephone Encounter (Signed)
We have you scheduled for an upcoming appointment at our office. At this time, we are still not allowing visitors or children during your appointment, however, a support person, over age 34, may accompany you to your appointment if assistance is needed for safety or care concerns. Otherwise, support persons should remain outside until the visit is complete.  ° °We ask if you have had any exposure to anyone suspected or confirmed of having COVID-19 or if you are experiencing any of the following, to call and reschedule your appointment: fever, cough, shortness of breath, muscle pain, diarrhea, rash, vomiting, abdominal pain, red eye, weakness, bruising, bleeding, joint pain, or a severe headache.  ° °Please know we will ask you these questions or similar questions when you arrive for your appointment and again it’s how we are keeping everyone safe.   ° °Also,to keep you safe, please use the provided hand sanitizer when you enter the office. We are asking everyone in the office to wear a mask to help prevent the spread of °germs. If you have a mask of your own, please wear it to your appointment, if not, we are happy to provide one for you. ° °Thank you for understanding and your cooperation.  ° ° °[facility] Staff °

## 2019-06-13 ENCOUNTER — Other Ambulatory Visit: Payer: Medicaid Other | Admitting: Adult Health

## 2019-06-16 ENCOUNTER — Telehealth: Payer: Self-pay | Admitting: Obstetrics & Gynecology

## 2019-06-16 NOTE — Telephone Encounter (Signed)

## 2019-06-19 ENCOUNTER — Ambulatory Visit: Payer: Medicaid Other

## 2019-06-23 ENCOUNTER — Telehealth: Payer: Self-pay | Admitting: Obstetrics & Gynecology

## 2019-06-23 NOTE — Telephone Encounter (Signed)
Called patient regarding appointment scheduled in our office and advised to come alone to the visits, however, a support person, over age 34, may accompany her  to appointment if assistance is needed for safety or care concerns. Otherwise, support persons should remain outside until the visit is complete.   We ask if you have had any exposure to anyone suspected or confirmed of having COVID-19, are awaiting test results for COVID-19 or if you are experiencing any of the following, to call and reschedule your appointment: fever, cough, shortness of breath, muscle pain, diarrhea, rash, vomiting, abdominal pain, red eye, weakness, bruising, bleeding, joint pain, or a severe headache.   Please know we will ask you these questions or similar questions when you arrive for your appointment and again it's how we are keeping everyone safe.    Also,to keep you safe, please use the provided hand sanitizer when you enter the office. We are asking everyone in the office to wear a mask to help prevent the spread of germs. If you have a mask of your own, please wear it to your appointment, if not, we are happy to provide one for you.  Thank you for understanding and your cooperation.    [facility] Staff    

## 2019-06-26 ENCOUNTER — Other Ambulatory Visit: Payer: Self-pay

## 2019-06-26 ENCOUNTER — Encounter: Payer: Self-pay | Admitting: *Deleted

## 2019-06-26 ENCOUNTER — Ambulatory Visit (INDEPENDENT_AMBULATORY_CARE_PROVIDER_SITE_OTHER): Payer: Medicaid Other | Admitting: *Deleted

## 2019-06-26 DIAGNOSIS — Z3202 Encounter for pregnancy test, result negative: Secondary | ICD-10-CM

## 2019-06-26 DIAGNOSIS — Z308 Encounter for other contraceptive management: Secondary | ICD-10-CM

## 2019-06-26 DIAGNOSIS — Z3042 Encounter for surveillance of injectable contraceptive: Secondary | ICD-10-CM

## 2019-06-26 LAB — POCT URINE PREGNANCY: Preg Test, Ur: NEGATIVE

## 2019-06-26 MED ORDER — MEDROXYPROGESTERONE ACETATE 150 MG/ML IM SUSP
150.0000 mg | Freq: Once | INTRAMUSCULAR | Status: AC
  Administered 2019-06-26: 16:00:00 150 mg via INTRAMUSCULAR

## 2019-06-26 NOTE — Progress Notes (Signed)
   NURSE VISIT- INJECTION  SUBJECTIVE:  Holly Hodge is a 33 y.o. G58P3003 female here for a Depo Provera for contraception/period management. She is a GYN patient.   OBJECTIVE:  There were no vitals taken for this visit.  Appears well, in no apparent distress  Injection administered in: Left deltoid  Meds ordered this encounter  Medications  . medroxyPROGESTERone (DEPO-PROVERA) injection 150 mg    ASSESSMENT: GYN patient Depo Provera for contraception/period management PLAN: Follow-up: in 11-13 weeks for next Depo   Levy Pupa  06/26/2019 4:31 PM

## 2019-07-24 ENCOUNTER — Other Ambulatory Visit: Payer: Medicaid Other

## 2019-07-25 ENCOUNTER — Other Ambulatory Visit: Payer: Self-pay

## 2019-07-25 ENCOUNTER — Ambulatory Visit: Payer: Medicaid Other | Attending: Internal Medicine

## 2019-07-25 ENCOUNTER — Other Ambulatory Visit: Payer: Medicaid Other

## 2019-07-25 DIAGNOSIS — Z20822 Contact with and (suspected) exposure to covid-19: Secondary | ICD-10-CM

## 2019-07-26 LAB — NOVEL CORONAVIRUS, NAA: SARS-CoV-2, NAA: NOT DETECTED

## 2019-07-27 ENCOUNTER — Telehealth: Payer: Self-pay

## 2019-07-27 NOTE — Telephone Encounter (Signed)
Pt notified of negative COVID-19 results. Understanding verbalized.  Holly Hodge   

## 2019-08-14 ENCOUNTER — Telehealth: Payer: Self-pay | Admitting: Adult Health

## 2019-08-14 NOTE — Telephone Encounter (Signed)

## 2019-08-15 ENCOUNTER — Other Ambulatory Visit: Payer: Self-pay

## 2019-08-15 ENCOUNTER — Ambulatory Visit (INDEPENDENT_AMBULATORY_CARE_PROVIDER_SITE_OTHER): Payer: Medicaid Other | Admitting: Adult Health

## 2019-08-15 ENCOUNTER — Other Ambulatory Visit (HOSPITAL_COMMUNITY)
Admission: RE | Admit: 2019-08-15 | Discharge: 2019-08-15 | Disposition: A | Discharge status: Home / Self Care | Payer: Medicaid Other | Source: Ambulatory Visit | Attending: Adult Health | Admitting: Adult Health

## 2019-08-15 ENCOUNTER — Encounter: Payer: Self-pay | Admitting: Adult Health

## 2019-08-15 VITALS — BP 145/90 | HR 73 | Ht 65.0 in | Wt 239.4 lb

## 2019-08-15 DIAGNOSIS — F329 Major depressive disorder, single episode, unspecified: Secondary | ICD-10-CM

## 2019-08-15 DIAGNOSIS — Z0000 Encounter for general adult medical examination without abnormal findings: Secondary | ICD-10-CM

## 2019-08-15 DIAGNOSIS — N898 Other specified noninflammatory disorders of vagina: Secondary | ICD-10-CM | POA: Diagnosis not present

## 2019-08-15 DIAGNOSIS — Z01419 Encounter for gynecological examination (general) (routine) without abnormal findings: Secondary | ICD-10-CM | POA: Insufficient documentation

## 2019-08-15 DIAGNOSIS — F32A Depression, unspecified: Secondary | ICD-10-CM

## 2019-08-15 DIAGNOSIS — Z3042 Encounter for surveillance of injectable contraceptive: Secondary | ICD-10-CM | POA: Insufficient documentation

## 2019-08-15 DIAGNOSIS — M940 Chondrocostal junction syndrome [Tietze]: Secondary | ICD-10-CM | POA: Insufficient documentation

## 2019-08-15 LAB — POCT WET PREP (WET MOUNT)
Trichomonas Wet Prep HPF POC: ABSENT
WBC, Wet Prep HPF POC: POSITIVE

## 2019-08-15 MED ORDER — MEDROXYPROGESTERONE ACETATE 150 MG/ML IM SUSP: 150.0000 mg | INTRAMUSCULAR | 4 refills | Status: DC

## 2019-08-15 MED ORDER — NAPROXEN 500 MG PO TABS: 500.0000 mg | ORAL_TABLET | Freq: Two times a day (BID) | ORAL | 1 refills | Status: DC

## 2019-08-15 NOTE — Progress Notes (Signed)
Patient ID: NEAH SPORRER, female   DOB: 1986/05/29, 34 y.o.   MRN: [id number] History of Present Illness: Holly Hodge is a 34 year old black female, single, G3P3 in for a well woman gyn exam and pap. She is on depo and has not been sexually active in about 8 months then had sex and has irritation. PCP is [facility].    Current Medications, Allergies, Past Medical History, Past Surgical History, Family History and Social History were reviewed in [employer] record.     Review of Systems: Patient denies any headaches, hearing loss, fatigue, blurred vision, shortness of breath,  abdominal pain, problems with bowel movements, urination, or intercourse. No joint pain or mood swings. Has noticed pain in chest when stretching like with a yawn  +vaginal irritation   Physical Exam:BP (!) 145/90 (BP Location: Right Arm, Patient Position: Sitting, Cuff Size: Normal)   Pulse 73   Ht 5\' 5"  (1.651 m)   Wt 239 lb 6.4 oz (108.6 kg)   BMI 39.84 kg/m  General:  Well developed, well nourished, no acute distress Skin:  Warm and dry Neck:  Midline trachea, normal thyroid, good ROM, no lymphadenopathy Lungs; Clear to auscultation bilaterally Breast:  No dominant palpable mass, retraction, or nipple discharge Has +tnedernss of sternum and costal margin.  Cardiovascular: Regular rate and rhythm Abdomen:  Soft, non tender, no hepatosplenomegaly Pelvic:  External genitalia is normal in appearance, no lesions.  The vagina is normal in appearance,has white discharge, no odor, noted. Urethra has no lesions or masses. The cervix is bulbous.Pap with GC/CHL and high risk HPV 16/178 genotyping performed Uterus is felt to be normal size, shape, and contour.  No adnexal masses or tenderness noted.Bladder is non tender, no masses felt. Wet prep was negative. Extremities/musculoskeletal:  No swelling or varicosities noted, no clubbing or cyanosis Psych:  No mood changes, alert and cooperative,seems  happy Fall risk is low PHQ 9 score is 14, denies being suicidal.  Exam performed without chaperone, with her permission.  Impression and Plan: 1. Encounter for gynecological examination with Papanicolaou smear of cervix Pap sent Physical in 1 year Pap in 3 if normal Labs with PCP  2. Costochondritis Will rx naprosyn and try alternating ice and heat Review handout on costochondritis  Meds ordered this encounter  Medications  . naproxen (NAPROSYN) 500 MG tablet    Sig: Take 1 tablet (500 mg total) by mouth 2 (two) times daily with a meal.    Dispense:  30 tablet    Refill:  1    Order Specific Question:   Supervising Provider    Answer:   Marland Kitchen, LUTHER H [[id number]]  . medroxyPROGESTERone (DEPO-PROVERA) 150 MG/ML injection    Sig: Inject 1 mL (150 mg total) into the muscle every 3 (three) months.    Dispense:  1 mL    Refill:  4    Order Specific Question:   Supervising Provider    Answer:   Despina Hidden, LUTHER H [[id number]]    3. Vaginal irritation   4. Encounter for surveillance of injectable contraceptive Refilled depo   5. Depression, unspecified depression type

## 2019-08-15 NOTE — Patient Instructions (Signed)
Costochondritis Costochondritis is swelling and irritation (inflammation) of the tissue (cartilage) that connects your ribs to your breastbone (sternum). This causes pain in the front of your chest. Usually, the pain:  Starts gradually.  Is in more than one rib. This condition usually goes away on its own over time. Follow these instructions at home:  Do not do anything that makes your pain worse.  If directed, put ice on the painful area: ? Put ice in a plastic bag. ? Place a towel between your skin and the bag. ? Leave the ice on for 20 minutes, 2-3 times a day.  If directed, put heat on the affected area as often as told by your doctor. Use the heat source that your doctor tells you to use, such as a moist heat pack or a heating pad. ? Place a towel between your skin and the heat source. ? Leave the heat on for 20-30 minutes. ? Take off the heat if your skin turns bright red. This is very important if you cannot feel pain, heat, or cold. You may have a greater risk of getting burned.  Take over-the-counter and prescription medicines only as told by your doctor.  Return to your normal activities as told by your doctor. Ask your doctor what activities are safe for you.  Keep all follow-up visits as told by your doctor. This is important. Contact a doctor if:  You have chills or a fever.  Your pain does not go away or it gets worse.  You have a cough that does not go away. Get help right away if:  You are short of breath. This information is not intended to replace advice given to you by your health care provider. Make sure you discuss any questions you have with your health care provider. Document Revised: 07/07/2017 Document Reviewed: 10/16/2015 Elsevier Patient Education  2020 Elsevier Inc.  

## 2019-08-17 ENCOUNTER — Telehealth: Payer: Self-pay | Admitting: Adult Health

## 2019-08-17 LAB — CYTOLOGY - PAP
Adequacy: ABSENT
Chlamydia: NEGATIVE
Comment: NEGATIVE
Comment: NEGATIVE
Comment: NORMAL
Diagnosis: NEGATIVE
High risk HPV: NEGATIVE
Neisseria Gonorrhea: NEGATIVE

## 2019-08-17 MED ORDER — FLUCONAZOLE 150 MG PO TABS: ORAL_TABLET | ORAL | 1 refills | Status: DC

## 2019-08-17 NOTE — Telephone Encounter (Signed)
Left message that pap was negative for malignancy and HPV and GC/CHL but has yeast will rx diflucan

## 2019-09-20 ENCOUNTER — Telehealth: Payer: Self-pay | Admitting: Obstetrics & Gynecology

## 2019-09-20 NOTE — Telephone Encounter (Signed)

## 2019-09-21 ENCOUNTER — Ambulatory Visit: Payer: Medicaid Other

## 2019-11-07 ENCOUNTER — Telehealth: Payer: Self-pay | Admitting: Obstetrics & Gynecology

## 2019-11-07 NOTE — Telephone Encounter (Signed)

## 2019-11-08 ENCOUNTER — Other Ambulatory Visit: Payer: Self-pay

## 2019-11-08 ENCOUNTER — Ambulatory Visit (INDEPENDENT_AMBULATORY_CARE_PROVIDER_SITE_OTHER): Payer: Medicaid Other | Admitting: *Deleted

## 2019-11-08 ENCOUNTER — Other Ambulatory Visit: Payer: Medicaid Other

## 2019-11-08 DIAGNOSIS — Z3042 Encounter for surveillance of injectable contraceptive: Secondary | ICD-10-CM | POA: Diagnosis not present

## 2019-11-08 LAB — BETA HCG QUANT (REF LAB): hCG Quant: <1 m[IU]/mL

## 2019-11-08 MED ORDER — MEDROXYPROGESTERONE ACETATE 150 MG/ML IM SUSP
150.0000 mg | Freq: Once | INTRAMUSCULAR | Status: AC
  Administered 2019-11-08: 150 mg via INTRAMUSCULAR

## 2019-11-08 NOTE — Progress Notes (Signed)
   NURSE VISIT- INJECTION  SUBJECTIVE:  Holly Hodge is a 34 y.o. G28P3003 female here for a Depo Provera for contraception/period management. She is a GYN patient.   OBJECTIVE:  There were no vitals taken for this visit.  Appears well, in no apparent distress  Injection administered in: Right deltoid  Meds ordered this encounter  Medications  . medroxyPROGESTERone (DEPO-PROVERA) injection 150 mg    ASSESSMENT: GYN patient Depo Provera for contraception/period management PLAN: Follow-up: in 11-13 weeks for next Depo   Stoney Bang  11/08/2019 4:10 PM

## 2020-01-31 ENCOUNTER — Ambulatory Visit: Payer: Medicaid Other

## 2020-02-22 ENCOUNTER — Other Ambulatory Visit: Payer: Self-pay

## 2020-02-22 ENCOUNTER — Other Ambulatory Visit: Payer: Medicaid Other

## 2020-02-22 ENCOUNTER — Ambulatory Visit (INDEPENDENT_AMBULATORY_CARE_PROVIDER_SITE_OTHER): Payer: Medicaid Other | Admitting: *Deleted

## 2020-02-22 DIAGNOSIS — Z308 Encounter for other contraceptive management: Secondary | ICD-10-CM

## 2020-02-22 DIAGNOSIS — Z3042 Encounter for surveillance of injectable contraceptive: Secondary | ICD-10-CM

## 2020-02-22 LAB — BETA HCG QUANT (REF LAB): hCG Quant: <1 m[IU]/mL

## 2020-02-22 MED ORDER — MEDROXYPROGESTERONE ACETATE 150 MG/ML IM SUSP
150.0000 mg | Freq: Once | INTRAMUSCULAR | Status: AC
  Administered 2020-02-22: 150 mg via INTRAMUSCULAR

## 2020-02-22 NOTE — Progress Notes (Signed)
   NURSE VISIT- INJECTION  SUBJECTIVE:  Holly Hodge is a 34 y.o. G35P3003 female here for a Depo Provera for contraception/period management. She is a GYN patient.   OBJECTIVE:  There were no vitals taken for this visit.  Appears well, in no apparent distress  Injection administered in: Left deltoid  Meds ordered this encounter  Medications  . medroxyPROGESTERone (DEPO-PROVERA) injection 150 mg    ASSESSMENT: GYN patient Depo Provera for contraception/period management PLAN: Follow-up: in 11-13 weeks for next Depo   Malachy Mood  02/22/2020 3:35 PM

## 2020-03-08 ENCOUNTER — Emergency Department (HOSPITAL_COMMUNITY)
Admission: EM | Admit: 2020-03-08 | Discharge: 2020-03-08 | Discharge status: Against Medical Advice | Payer: Medicaid Other | Attending: Emergency Medicine | Admitting: Emergency Medicine

## 2020-03-08 ENCOUNTER — Encounter (HOSPITAL_COMMUNITY): Payer: Self-pay

## 2020-03-08 ENCOUNTER — Other Ambulatory Visit: Payer: Self-pay

## 2020-03-08 DIAGNOSIS — H9201 Otalgia, right ear: Secondary | ICD-10-CM | POA: Diagnosis present

## 2020-03-08 DIAGNOSIS — F17210 Nicotine dependence, cigarettes, uncomplicated: Secondary | ICD-10-CM | POA: Diagnosis not present

## 2020-03-08 DIAGNOSIS — H6121 Impacted cerumen, right ear: Secondary | ICD-10-CM | POA: Diagnosis not present

## 2020-03-08 MED ORDER — HYDROGEN PEROXIDE 3 % EX SOLN
Freq: Once | CUTANEOUS | Status: AC
  Filled 2020-03-08: qty 473

## 2020-03-08 NOTE — ED Triage Notes (Signed)
Pt to er, pt c/o ear fullness and pain for the past two months. States that sometimes her ear will pop and then it is better, but today it wouldn't pop.  Pt c/o R ear fullness.  Reports some problems with hearing out of R ear

## 2020-03-08 NOTE — ED Provider Notes (Signed)
[facility] EMERGENCY DEPARTMENT Provider Note   CSN: [id number] Arrival date & time: 03/08/20  1033     History Chief Complaint  Patient presents with  . Otalgia    Holly Hodge is a 34 y.o. female with a history as outlined below including problems with increased cerumen production presenting with a gradual worsening of right ear fullness and intermittent episodes of pain along with decreased hearing acuity from her right ear.  She reports occasional episodes of popping that then clears her hearing, but has been able to pop or improve her hearing for the past several days.  There is been no drainage from the ear, she denies fevers or chills, no dental or throat pain, no nasal congestion or sinus concerns.  She has had no treatment prior to arrival for her symptoms.  HPI     Past Medical History:  Diagnosis Date  . Anxiety   . Irregular intermenstrual bleeding 06/12/2014  . 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/11/2012  . PE (pulmonary embolism) 2013  . Vaginal discharge 08/09/2014  . Vaginal irritation 12/24/2014  . Yeast infection 12/24/2014    Patient Active Problem List   Diagnosis Date Noted  . Costochondritis 08/15/2019  . Encounter for gynecological examination with Papanicolaou smear of cervix 08/15/2019  . Vaginal irritation 08/15/2019  . Encounter for surveillance of injectable contraceptive 08/15/2019  . Depression 04/27/2018  . Encounter for well woman exam with routine gynecological exam 04/27/2018  . Nexplanon insertion 12/06/2014  . Abnormal Pap smear of cervix 05/21/2014  . 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/11/2012  . Nausea and vomiting 03/09/2012  . 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/08/2012  . Tobacco abuse 03/08/2012  . Obesity 03/08/2012  . Menorrhagia 03/08/2012    Past Surgical History:  Procedure Laterality Date  . CESAREAN SECTION     x2  . CESAREAN SECTION N/A 02/14/2014   Procedure: CESAREAN SECTION;  Surgeon: Adam Phenix, MD;  Location: [facility];  Service: Obstetrics;  Laterality: N/A;  . CHOLECYSTECTOMY  2005  . WISDOM TOOTH EXTRACTION       OB History    Gravida  3   Para  3   Term  3   Preterm      AB      Living  3     SAB      TAB      Ectopic      Multiple      Live Births  3           Family History  Problem Relation Age of Onset  . Hypertension Mother   . Hypertension Father   . Stroke Father   . Cancer Paternal Grandmother   . Cancer Other        runs on both sides of family    Social History   Tobacco Use  . Smoking status: Current Every Day Smoker    Packs/day: 0.50    Years: 12.00    Pack years: 6.00    Types: Cigarettes  . Smokeless tobacco: Never Used  Vaping Use  . Vaping Use: Never used  Substance Use Topics  . Alcohol use: Yes    Comment: occ  . Drug use: Yes    Types: Marijuana    Comment: 3-4 days a week    Home Medications Prior to Admission medications   Medication Sig Start Date End Date Taking? Authorizing Provider  fluconazole (DIFLUCAN) 150 MG tablet Take 1 now and repeat 1 in 3 days  08/17/19   Adline Potter, NP  medroxyPROGESTERone (DEPO-PROVERA) 150 MG/ML injection Inject 1 mL (150 mg total) into the muscle every 3 (three) months. 08/15/19   Adline Potter, NP  naproxen (NAPROSYN) 500 MG tablet Take 1 tablet (500 mg total) by mouth 2 (two) times daily with a meal. 08/15/19   Adline Potter, NP    Allergies    Patient has no known allergies.  Review of Systems   Review of Systems  Constitutional: Negative for chills and fever.  HENT: Positive for ear pain and hearing loss. Negative for congestion, rhinorrhea, sinus pressure, sore throat, trouble swallowing and voice change.   Eyes: Negative for discharge.  Respiratory: Negative for cough, shortness of breath, wheezing and stridor.   Cardiovascular: Negative for chest pain.  Gastrointestinal: Negative for abdominal pain.  Genitourinary: Negative.     Physical Exam Updated Vital  Signs BP (!) 151/94 (BP Location: Right Arm)   Pulse 73   Temp 98.6 F (37 C) (Oral)   Resp 20   Ht 5\' 5"  (1.651 m)   Wt 108.9 kg   SpO2 99%   BMI 39.94 kg/m   Physical Exam Constitutional:      Appearance: She is well-developed.  HENT:     Head: Normocephalic and atraumatic.     Right Ear: There is impacted cerumen.     Left Ear: Tympanic membrane and ear canal normal.     Nose: Mucosal edema present. No rhinorrhea.     Mouth/Throat:     Pharynx: Uvula midline. No oropharyngeal exudate or posterior oropharyngeal erythema.     Tonsils: No tonsillar abscesses.  Eyes:     Conjunctiva/sclera: Conjunctivae normal.  Cardiovascular:     Rate and Rhythm: Normal rate.  Pulmonary:     Effort: Pulmonary effort is normal.  Musculoskeletal:        General: Normal range of motion.  Skin:    General: Skin is warm and dry.     Findings: No rash.  Neurological:     Mental Status: She is alert and oriented to person, place, and time.     ED Results / Procedures / Treatments   Labs (all labs ordered are listed, but only abnormal results are displayed) Labs Reviewed - No data to display  EKG None  Radiology No results found.  Procedures .Ear Cerumen Removal  Date/Time: 03/08/2020 12:40 PM Performed by: 05/08/2020, RN Authorized by: Rachel Moulds, PA-C   Consent:    Consent obtained:  Verbal   Consent given by:  Patient   Risks discussed:  Bleeding, infection, pain, TM perforation and dizziness   Alternatives discussed:  No treatment Procedure details:    Location:  R ear   Procedure type: irrigation   Post-procedure details:    Inspection:  TM intact   Hearing quality:  Improved   Patient tolerance of procedure:  Tolerated well, no immediate complications   (including critical care time)  Medications Ordered in ED Medications  hydrogen peroxide 3 % external solution (has no administration in time range)    ED Course  I have reviewed the triage vital signs  and the nursing notes.  Pertinent labs & imaging results that were available during my care of the patient were reviewed by me and considered in my medical decision making (see chart for details).    MDM Rules/Calculators/A&P  Patient with successful removal of cerumen impaction with symptoms resolved..  Follow-up anticipated. Final Clinical Impression(s) / ED Diagnoses Final diagnoses:  Impacted cerumen of right ear    Rx / DC Orders ED Discharge Orders    None       Victoriano Lain 03/08/20 1242    Bethann Berkshire, MD 03/12/20 [phone]

## 2020-03-26 ENCOUNTER — Ambulatory Visit
Admission: EM | Admit: 2020-03-26 | Discharge: 2020-03-26 | Disposition: A | Discharge status: Home / Self Care | Payer: Medicaid Other | Attending: Family Medicine | Admitting: Family Medicine

## 2020-03-26 ENCOUNTER — Other Ambulatory Visit: Payer: Self-pay

## 2020-03-26 DIAGNOSIS — R6889 Other general symptoms and signs: Secondary | ICD-10-CM | POA: Diagnosis not present

## 2020-03-26 DIAGNOSIS — Z1152 Encounter for screening for COVID-19: Secondary | ICD-10-CM | POA: Diagnosis not present

## 2020-03-26 NOTE — ED Triage Notes (Signed)
covid test

## 2020-03-29 LAB — NOVEL CORONAVIRUS, NAA

## 2020-04-29 ENCOUNTER — Ambulatory Visit
Admission: EM | Admit: 2020-04-29 | Discharge: 2020-04-29 | Disposition: A | Discharge status: Home / Self Care | Payer: Medicaid Other | Attending: Family Medicine | Admitting: Family Medicine

## 2020-04-29 ENCOUNTER — Other Ambulatory Visit: Payer: Self-pay

## 2020-04-29 DIAGNOSIS — L02213 Cutaneous abscess of chest wall: Secondary | ICD-10-CM

## 2020-04-29 NOTE — ED Triage Notes (Signed)
Pt presents with abscess on chest that developed  this past week

## 2020-04-29 NOTE — ED Provider Notes (Signed)
[facility]    CSN: [id number] Arrival date & time: 04/29/20  1011      History   Chief Complaint Chief Complaint  Patient presents with  . Abscess    HPI Holly Hodge is a 34 y.o. female.   HPI  Patient presents with an abscess localized to her mid chest which is progressively worsened over a week.  Patient is not a diabetic. Reports the abscess is heavy and she feels pressure when lying down. No fever, nausea, or vomiting. Past Medical History:  Diagnosis Date  . Anxiety   . Irregular intermenstrual bleeding 06/12/2014  . 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/11/2012  . PE (pulmonary embolism) 2013  . Vaginal discharge 08/09/2014  . Vaginal irritation 12/24/2014  . Yeast infection 12/24/2014    Patient Active Problem List   Diagnosis Date Noted  . Costochondritis 08/15/2019  . Encounter for gynecological examination with Papanicolaou smear of cervix 08/15/2019  . Vaginal irritation 08/15/2019  . Encounter for surveillance of injectable contraceptive 08/15/2019  . Depression 04/27/2018  . Encounter for well woman exam with routine gynecological exam 04/27/2018  . Nexplanon insertion 12/06/2014  . Abnormal Pap smear of cervix 05/21/2014  . 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/11/2012  . Nausea and vomiting 03/09/2012  . 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/08/2012  . Tobacco abuse 03/08/2012  . Obesity 03/08/2012  . Menorrhagia 03/08/2012    Past Surgical History:  Procedure Laterality Date  . CESAREAN SECTION     x2  . CESAREAN SECTION N/A 02/14/2014   Procedure: CESAREAN SECTION;  Surgeon: Adam Phenix, MD;  Location: [facility];  Service: Obstetrics;  Laterality: N/A;  . CHOLECYSTECTOMY  2005  . WISDOM TOOTH EXTRACTION      OB History    Gravida  3   Para  3   Term  3   Preterm      AB      Living  3     SAB      TAB      Ectopic      Multiple      Live Births  3            Home Medications    Prior to Admission  medications   Medication Sig Start Date End Date Taking? Authorizing Provider  fluconazole (DIFLUCAN) 150 MG tablet Take 1 now and repeat 1 in 3 days 08/17/19   Adline Potter, NP  medroxyPROGESTERone (DEPO-PROVERA) 150 MG/ML injection Inject 1 mL (150 mg total) into the muscle every 3 (three) months. 08/15/19   Adline Potter, NP  naproxen (NAPROSYN) 500 MG tablet Take 1 tablet (500 mg total) by mouth 2 (two) times daily with a meal. 08/15/19   Adline Potter, NP    Family History Family History  Problem Relation Age of Onset  . Hypertension Mother   . Hypertension Father   . Stroke Father   . Cancer Paternal Grandmother   . Cancer Other        runs on both sides of family    Social History Social History   Tobacco Use  . Smoking status: Current Every Day Smoker    Packs/day: 0.50    Years: 12.00    Pack years: 6.00    Types: Cigarettes  . Smokeless tobacco: Never Used  Vaping Use  . Vaping Use: Never used  Substance Use Topics  . Alcohol use: Yes    Comment: occ  . Drug use: Yes  Types: Marijuana    Comment: 3-4 days a week     Allergies   Patient has no known allergies.   Review of Systems Review of Systems Pertinent negatives listed in HPI  Physical Exam Triage Vital Signs ED Triage Vitals [04/29/20 1057]  Enc Vitals Group     BP 129/83     Pulse Rate 90     Resp 20     Temp 99.7 F (37.6 C)     Temp src      SpO2 98 %     Weight      Height      Head Circumference      Peak Flow      Pain Score 6     Pain Loc      Pain Edu?      Excl. in GC?    No data found.  Updated Vital Signs BP 129/83   Pulse 90   Temp 99.7 F (37.6 C)   Resp 20   SpO2 98%   Visual Acuity Right Eye Distance:   Left Eye Distance:   Bilateral Distance:    Right Eye Near:   Left Eye Near:    Bilateral Near:     Physical Exam General appearance: alert, well developed, well nourished, cooperative and in no distress Head: Normocephalic, without  obvious abnormality, atraumatic Respiratory: Respirations even and unlabored, normal respiratory rate Heart: rate and rhythm normal. No gallop or murmurs noted on exam  Chest Wall: abscess , pustular lesion present  Skin: Skin color, texture, turgor normal. No rashes seen  Psych: Appropriate mood and affect. UC Treatments / Results  Labs (all labs ordered are listed, but only abnormal results are displayed) Labs Reviewed - No data to display  EKG   Radiology No results found.  Procedures Incision and Drainage  Date/Time: 04/29/2020 11:15 AM Performed by: Bing Neighbors, FNP Authorized by: Bing Neighbors, FNP   Consent:    Consent obtained:  Verbal   Consent given by:  Patient   Risks discussed:  Bleeding, incomplete drainage and infection   Alternatives discussed:  No treatment Location:    Type:  Abscess   Location:  Trunk   Trunk location:  Chest Pre-procedure details:    Skin preparation:  Betadine Anesthesia (see MAR for exact dosages):    Anesthesia method:  Topical application and local infiltration   Local anesthetic:  Lidocaine 2% WITH epi Procedure type:    Complexity:  Simple Procedure details:    Needle aspiration: no     Incision types:  Single straight   Incision depth:  Subcutaneous   Scalpel blade:  10   Wound management:  Irrigated with saline   Drainage:  Purulent and bloody   Drainage amount:  Copious   Wound treatment:  Wound left open   Packing materials:  1/2 in iodoform gauze Post-procedure details:    Patient tolerance of procedure:  Tolerated well, no immediate complications   (including critical care time)  Medications Ordered in UC Medications - No data to display  Initial Impression / Assessment and Plan / UC Course  I have reviewed the triage vital signs and the nursing notes.  Pertinent labs & imaging results that were available during my care of the patient were reviewed by me and considered in my medical decision  making (see chart for details).    I&D performed of a sebaceous abscess located on the chest wall.  Large amount of purulent discharge expressed  from abscess.  Patient tolerated.  No indication of infection therefore no antibiotics were prescribed.  Dressed with a pressure dressing.  Advised to return in 3 days for a wound check and evaluation.  Patient also instructed to change bandage at least twice daily.  Return sooner if any evidence of infection develops. Final Clinical Impressions(s) / UC Diagnoses   Final diagnoses:  Abscess of chest wall     Discharge Instructions     Monitor for signs of infection.  Return in 3 days for a wound check.  Change dressing at least twice daily you may continue to have some mild drainage but this is an expected finding.    ED Prescriptions    None     PDMP not reviewed this encounter.   Bing Neighbors, FNP 04/29/20 1143

## 2020-04-29 NOTE — Discharge Instructions (Signed)
Monitor for signs of infection.  Return in 3 days for a wound check.  Change dressing at least twice daily you may continue to have some mild drainage but this is an expected finding.

## 2020-05-01 ENCOUNTER — Ambulatory Visit
Admission: EM | Admit: 2020-05-01 | Discharge: 2020-05-01 | Disposition: A | Discharge status: Home / Self Care | Payer: Medicaid Other

## 2020-05-01 ENCOUNTER — Telehealth: Payer: Self-pay

## 2020-05-01 ENCOUNTER — Other Ambulatory Visit: Payer: Self-pay

## 2020-05-01 MED ORDER — CEPHALEXIN 500 MG PO CAPS: 500.0000 mg | ORAL_CAPSULE | Freq: Four times a day (QID) | ORAL | 0 refills | Status: DC

## 2020-05-01 NOTE — ED Triage Notes (Signed)
Pt return for wound check , wound draining, provider made aware, antibiotic sent , provider visit not made

## 2020-05-16 ENCOUNTER — Ambulatory Visit (INDEPENDENT_AMBULATORY_CARE_PROVIDER_SITE_OTHER): Payer: Medicaid Other | Admitting: *Deleted

## 2020-05-16 DIAGNOSIS — Z3042 Encounter for surveillance of injectable contraceptive: Secondary | ICD-10-CM | POA: Diagnosis not present

## 2020-05-16 MED ORDER — MEDROXYPROGESTERONE ACETATE 150 MG/ML IM SUSP
150.0000 mg | Freq: Once | INTRAMUSCULAR | Status: AC
  Administered 2020-05-16: 150 mg via INTRAMUSCULAR

## 2020-05-16 NOTE — Progress Notes (Signed)
   NURSE VISIT- INJECTION  SUBJECTIVE:  Holly Hodge is a 34 y.o. G74P3003 female here for a Depo Provera for contraception/period management. She is a GYN patient.   OBJECTIVE:  There were no vitals taken for this visit.  Appears well, in no apparent distress  Injection administered in: Right deltoid  Meds ordered this encounter  Medications  . medroxyPROGESTERone (DEPO-PROVERA) injection 150 mg    ASSESSMENT: GYN patient Depo Provera for contraception/period management PLAN: Follow-up: in 11-13 weeks for next Depo   Annamarie Dawley  05/16/2020 3:28 PM

## 2020-06-25 ENCOUNTER — Other Ambulatory Visit: Payer: Self-pay

## 2020-06-25 ENCOUNTER — Emergency Department (HOSPITAL_COMMUNITY)
Admission: EM | Admit: 2020-06-25 | Discharge: 2020-06-25 | Disposition: A | Discharge status: Home / Self Care | Payer: Medicaid Other | Attending: Emergency Medicine | Admitting: Emergency Medicine

## 2020-06-25 ENCOUNTER — Encounter (HOSPITAL_COMMUNITY): Payer: Self-pay | Admitting: *Deleted

## 2020-06-25 DIAGNOSIS — S3992XA Unspecified injury of lower back, initial encounter: Secondary | ICD-10-CM | POA: Diagnosis present

## 2020-06-25 DIAGNOSIS — F17210 Nicotine dependence, cigarettes, uncomplicated: Secondary | ICD-10-CM | POA: Insufficient documentation

## 2020-06-25 DIAGNOSIS — X58XXXA Exposure to other specified factors, initial encounter: Secondary | ICD-10-CM | POA: Insufficient documentation

## 2020-06-25 DIAGNOSIS — S39012A Strain of muscle, fascia and tendon of lower back, initial encounter: Secondary | ICD-10-CM | POA: Diagnosis not present

## 2020-06-25 LAB — URINALYSIS, ROUTINE W REFLEX MICROSCOPIC
Bacteria, UA: NONE SEEN
Bilirubin Urine: NEGATIVE
Glucose, UA: NEGATIVE mg/dL
Hgb urine dipstick: NEGATIVE
Ketones, ur: NEGATIVE mg/dL
Leukocytes,Ua: NEGATIVE
Nitrite: NEGATIVE
Protein, ur: 30 mg/dL — AB
Specific Gravity, Urine: 1.027 (ref 1.005–1.030)
pH: 5 (ref 5.0–8.0)

## 2020-06-25 LAB — BASIC METABOLIC PANEL
Anion gap: 12 (ref 5–15)
BUN: 7 mg/dL (ref 6–20)
CO2: 20 mmol/L — ABNORMAL LOW (ref 22–32)
Calcium: 9 mg/dL (ref 8.9–10.3)
Chloride: 104 mmol/L (ref 98–111)
Creatinine, Ser: 0.8 mg/dL (ref 0.44–1.00)
GFR, Estimated: >60 mL/min (ref >60)
Glucose, Bld: 95 mg/dL (ref 70–99)
Potassium: 3.8 mmol/L (ref 3.5–5.1)
Sodium: 136 mmol/L (ref 135–145)

## 2020-06-25 LAB — CBC WITH DIFFERENTIAL/PLATELET
Abs Immature Granulocytes: 0.03 10*3/uL (ref 0.00–0.07)
Basophils Absolute: 0.1 10*3/uL (ref 0.0–0.1)
Basophils Relative: 1 %
Eosinophils Absolute: 0.4 10*3/uL (ref 0.0–0.5)
Eosinophils Relative: 5 %
HCT: 44.2 % (ref 36.0–46.0)
Hemoglobin: 14.5 g/dL (ref 12.0–15.0)
Immature Granulocytes: 0 %
Lymphocytes Relative: 36 %
Lymphs Abs: 2.7 10*3/uL (ref 0.7–4.0)
MCH: 31.6 pg (ref 26.0–34.0)
MCHC: 32.8 g/dL (ref 30.0–36.0)
MCV: 96.3 fL (ref 80.0–100.0)
Monocytes Absolute: 0.4 10*3/uL (ref 0.1–1.0)
Monocytes Relative: 6 %
Neutro Abs: 4 10*3/uL (ref 1.7–7.7)
Neutrophils Relative %: 52 %
Platelets: 380 10*3/uL (ref 150–400)
RBC: 4.59 MIL/uL (ref 3.87–5.11)
RDW: 13.5 % (ref 11.5–15.5)
WBC: 7.6 10*3/uL (ref 4.0–10.5)
nRBC: 0 % (ref 0.0–0.2)

## 2020-06-25 LAB — PREGNANCY, URINE: Preg Test, Ur: NEGATIVE

## 2020-06-25 MED ORDER — DIAZEPAM 5 MG PO TABS
5.0000 mg | ORAL_TABLET | Freq: Once | ORAL | Status: AC
  Administered 2020-06-25: 15:00:00 5 mg via ORAL
  Filled 2020-06-25: qty 1

## 2020-06-25 MED ORDER — KETOROLAC TROMETHAMINE 30 MG/ML IJ SOLN
30.0000 mg | Freq: Once | INTRAMUSCULAR | Status: AC
  Administered 2020-06-25: 15:00:00 30 mg via INTRAMUSCULAR
  Filled 2020-06-25: qty 1

## 2020-06-25 MED ORDER — IBUPROFEN 600 MG PO TABS: 600.0000 mg | ORAL_TABLET | Freq: Four times a day (QID) | ORAL | 0 refills | Status: DC | PRN

## 2020-06-25 MED ORDER — DIAZEPAM 5 MG PO TABS: 5.0000 mg | ORAL_TABLET | Freq: Two times a day (BID) | ORAL | 0 refills | Status: DC

## 2020-06-25 NOTE — ED Triage Notes (Signed)
Pain in low back after having a bowel movement around 4 am today.

## 2020-06-25 NOTE — ED Provider Notes (Signed)
[facility] EMERGENCY DEPARTMENT Provider Note   CSN: [id number] Arrival date & time: 06/25/20  1334     History No chief complaint on file.   Holly Hodge is a 34 y.o. female.  Pt presents to the ED today with low back pain.  Pt said she had a bowel movement around 0400 and had back pain after that.  She did not feel like she strained.  Pain is mainly in the left lower back.  It does come around to the front.  It feels like "period pain" in the front and sharp in the back.  The pt denies any sob.  She denies any cp.  No leg numbness or weakness.  Pain with movement.        Past Medical History:  Diagnosis Date  . Anxiety   . Irregular intermenstrual bleeding 06/12/2014  . 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/11/2012  . PE (pulmonary embolism) 2013  . Vaginal discharge 08/09/2014  . Vaginal irritation 12/24/2014  . Yeast infection 12/24/2014    Patient Active Problem List   Diagnosis Date Noted  . Costochondritis 08/15/2019  . Encounter for gynecological examination with Papanicolaou smear of cervix 08/15/2019  . Vaginal irritation 08/15/2019  . Encounter for surveillance of injectable contraceptive 08/15/2019  . Depression 04/27/2018  . Encounter for well woman exam with routine gynecological exam 04/27/2018  . Nexplanon insertion 12/06/2014  . Abnormal Pap smear of cervix 05/21/2014  . 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/11/2012  . Nausea and vomiting 03/09/2012  . 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/08/2012  . Tobacco abuse 03/08/2012  . Obesity 03/08/2012  . Menorrhagia 03/08/2012    Past Surgical History:  Procedure Laterality Date  . CESAREAN SECTION     x2  . CESAREAN SECTION N/A 02/14/2014   Procedure: CESAREAN SECTION;  Surgeon: Adam Phenix, MD;  Location: [facility];  Service: Obstetrics;  Laterality: N/A;  . CHOLECYSTECTOMY  2005  . WISDOM TOOTH EXTRACTION       OB History    Gravida  3   Para  3   Term  3   Preterm      AB      Living  3      SAB      IAB      Ectopic      Multiple      Live Births  3           Family History  Problem Relation Age of Onset  . Hypertension Mother   . Hypertension Father   . Stroke Father   . Cancer Paternal Grandmother   . Cancer Other        runs on both sides of family    Social History   Tobacco Use  . Smoking status: Current Every Day Smoker    Packs/day: 0.50    Years: 12.00    Pack years: 6.00    Types: Cigarettes  . Smokeless tobacco: Never Used  Vaping Use  . Vaping Use: Never used  Substance Use Topics  . Alcohol use: Yes    Comment: occ  . Drug use: Yes    Types: Marijuana    Comment: 3-4 days a week    Home Medications Prior to Admission medications   Medication Sig Start Date End Date Taking? Authorizing Provider  Chlorphen-Phenyleph-Ibuprofen (ADVIL ALLERGY & CONGESTION PO) Take 1 tablet by mouth daily as needed.   Yes [provider]  medroxyPROGESTERone (DEPO-PROVERA) 150 MG/ML injection Inject 1 mL (150  mg total) into the muscle every 3 (three) months. 08/15/19  Yes Adline Potter, NP  naproxen (NAPROSYN) 500 MG tablet Take 1 tablet (500 mg total) by mouth 2 (two) times daily with a meal. 08/15/19  Yes Cyril Mourning A, NP  cephALEXin (KEFLEX) 500 MG capsule Take 1 capsule (500 mg total) by mouth 4 (four) times daily. Patient not taking: No sig reported 05/01/20   Avegno, Zachery Dakins, FNP  diazepam (VALIUM) 5 MG tablet Take 1 tablet (5 mg total) by mouth 2 (two) times daily. 06/25/20   Jacalyn Lefevre, MD  fluconazole (DIFLUCAN) 150 MG tablet Take 1 now and repeat 1 in 3 days Patient not taking: No sig reported 08/17/19   Cyril Mourning A, NP  ibuprofen (ADVIL) 600 MG tablet Take 1 tablet (600 mg total) by mouth every 6 (six) hours as needed. 06/25/20   Jacalyn Lefevre, MD    Allergies    Patient has no known allergies.  Review of Systems   Review of Systems  Musculoskeletal: Positive for back pain.  All other systems reviewed  and are negative.   Physical Exam Updated Vital Signs BP 117/79   Pulse 85   Temp 98.5 F (36.9 C) (Oral)   Resp 14   Ht 5\' 6"  (1.676 m)   Wt 108.9 kg   SpO2 98%   BMI 38.74 kg/m   Physical Exam Vitals and nursing note reviewed.  Constitutional:      Appearance: Normal appearance.  HENT:     Head: Normocephalic and atraumatic.     Right Ear: External ear normal.     Left Ear: External ear normal.     Nose: Nose normal.     Mouth/Throat:     Mouth: Mucous membranes are moist.     Pharynx: Oropharynx is clear.  Eyes:     Extraocular Movements: Extraocular movements intact.     Conjunctiva/sclera: Conjunctivae normal.     Pupils: Pupils are equal, round, and reactive to light.  Cardiovascular:     Rate and Rhythm: Normal rate and regular rhythm.     Pulses: Normal pulses.     Heart sounds: Normal heart sounds.  Pulmonary:     Effort: Pulmonary effort is normal.     Breath sounds: Normal breath sounds.  Abdominal:     General: Abdomen is flat. Bowel sounds are normal.     Palpations: Abdomen is soft.  Musculoskeletal:     Cervical back: Normal range of motion and neck supple.       Back:  Skin:    General: Skin is warm.     Capillary Refill: Capillary refill takes less than 2 seconds.  Neurological:     General: No focal deficit present.     Mental Status: She is alert and oriented to person, place, and time.     ED Results / Procedures / Treatments   Labs (all labs ordered are listed, but only abnormal results are displayed) Labs Reviewed  URINALYSIS, ROUTINE W REFLEX MICROSCOPIC - Abnormal; Notable for the following components:      Result Value   Color, Urine AMBER (*)    APPearance HAZY (*)    Protein, ur 30 (*)    All other components within normal limits  BASIC METABOLIC PANEL - Abnormal; Notable for the following components:   CO2 20 (*)    All other components within normal limits  PREGNANCY, URINE  CBC WITH DIFFERENTIAL/PLATELET     EKG None  Radiology No  results found.  Procedures Procedures (including critical care time)  Medications Ordered in ED Medications  ketorolac (TORADOL) 30 MG/ML injection 30 mg (30 mg Intramuscular Given 06/25/20 1527)  diazepam (VALIUM) tablet 5 mg (5 mg Oral Given 06/25/20 1526)    ED Course  I have reviewed the triage vital signs and the nursing notes.  Pertinent labs & imaging results that were available during my care of the patient were reviewed by me and considered in my medical decision making (see chart for details).    MDM Rules/Calculators/A&P                          Pt's pain has improved.  Labs are nl.  Pt is stable for d/c.  Return if worse.  F/u with pcp. Final Clinical Impression(s) / ED Diagnoses Final diagnoses:  Strain of lumbar region, initial encounter    Rx / DC Orders ED Discharge Orders         Ordered    ibuprofen (ADVIL) 600 MG tablet  Every 6 hours PRN        06/25/20 1640    diazepam (VALIUM) 5 MG tablet  2 times daily        06/25/20 1640           Jacalyn Lefevre, MD 06/25/20 1640

## 2020-06-26 ENCOUNTER — Telehealth: Payer: Self-pay

## 2020-06-26 NOTE — Telephone Encounter (Signed)
Transition Care Management Unsuccessful Follow-up Telephone Call  Date of discharge and from where:  06/25/2020 [facility]  Attempts:  1st Attempt  Reason for unsuccessful TCM follow-up call:  Left voice message

## 2020-06-27 NOTE — Telephone Encounter (Signed)
Transition Care Management Unsuccessful Follow-up Telephone Call  Date of discharge and from where:  06/25/[facility]  Attempts:  2nd Attempt  Reason for unsuccessful TCM follow-up call:  Left voice message

## 2020-07-01 NOTE — Telephone Encounter (Signed)
Transition Care Management Unsuccessful Follow-up Telephone Call  Date of discharge and from where:  06/25/2020 from [facility]  Attempts:  3rd Attempt  Reason for unsuccessful TCM follow-up call:  Unable to reach patient

## 2020-08-01 ENCOUNTER — Ambulatory Visit: Payer: Medicaid Other

## 2020-08-02 ENCOUNTER — Ambulatory Visit: Payer: Medicaid Other

## 2020-08-07 ENCOUNTER — Other Ambulatory Visit: Payer: Self-pay

## 2020-08-07 ENCOUNTER — Ambulatory Visit (INDEPENDENT_AMBULATORY_CARE_PROVIDER_SITE_OTHER): Payer: Medicaid Other | Admitting: *Deleted

## 2020-08-07 DIAGNOSIS — Z308 Encounter for other contraceptive management: Secondary | ICD-10-CM

## 2020-08-07 MED ORDER — MEDROXYPROGESTERONE ACETATE 150 MG/ML IM SUSP
150.0000 mg | Freq: Once | INTRAMUSCULAR | Status: AC
  Administered 2020-08-07: 150 mg via INTRAMUSCULAR

## 2020-08-07 NOTE — Progress Notes (Signed)
   NURSE VISIT- INJECTION  SUBJECTIVE:  Holly Hodge is a 35 y.o. G43P3003 female here for a Depo Provera for contraception/period management. She is a GYN patient.   OBJECTIVE:  There were no vitals taken for this visit.  Appears well, in no apparent distress  Injection administered in: Left deltoid  Meds ordered this encounter  Medications  . medroxyPROGESTERone (DEPO-PROVERA) injection 150 mg    ASSESSMENT: GYN patient Depo Provera for contraception/period management PLAN: Follow-up: in 11-13 weeks for next Depo   Malachy Mood  08/07/2020 2:48 PM

## 2020-10-29 ENCOUNTER — Other Ambulatory Visit: Payer: Self-pay | Admitting: Adult Health

## 2020-10-30 ENCOUNTER — Other Ambulatory Visit: Payer: Self-pay

## 2020-10-30 ENCOUNTER — Ambulatory Visit (INDEPENDENT_AMBULATORY_CARE_PROVIDER_SITE_OTHER): Payer: Medicaid Other | Admitting: *Deleted

## 2020-10-30 VITALS — BP 114/73 | HR 78 | Ht 65.0 in | Wt 236.0 lb

## 2020-10-30 DIAGNOSIS — Z308 Encounter for other contraceptive management: Secondary | ICD-10-CM | POA: Diagnosis not present

## 2020-10-30 MED ORDER — MEDROXYPROGESTERONE ACETATE 150 MG/ML IM SUSP
150.0000 mg | Freq: Once | INTRAMUSCULAR | Status: AC
  Administered 2020-10-30: 150 mg via INTRAMUSCULAR

## 2020-10-30 NOTE — Progress Notes (Signed)
   NURSE VISIT- INJECTION  SUBJECTIVE:  Holly Hodge is a 35 y.o. G37P3003 female here for a Depo Provera for contraception/period management. She is a GYN patient.   OBJECTIVE:  BP 114/73 (BP Location: Left Arm, Patient Position: Sitting, Cuff Size: Large)   Pulse 78   Ht 5\' 5"  (1.651 m)   Wt 236 lb (107 kg)   BMI 39.27 kg/m   Appears well, in no apparent distress  Injection administered in: Right deltoid  Meds ordered this encounter  Medications  . medroxyPROGESTERone (DEPO-PROVERA) injection 150 mg    ASSESSMENT: GYN patient Depo Provera for contraception/period management PLAN: Follow-up: in 11-13 weeks for next Depo   08-23-1971  10/30/2020 2:40 PM

## 2020-12-22 DIAGNOSIS — R69 Illness, unspecified: Secondary | ICD-10-CM | POA: Diagnosis not present

## 2020-12-22 DIAGNOSIS — R5381 Other malaise: Secondary | ICD-10-CM | POA: Diagnosis not present

## 2021-01-22 ENCOUNTER — Ambulatory Visit: Payer: Medicaid Other

## 2021-03-03 ENCOUNTER — Other Ambulatory Visit: Payer: Medicaid Other | Admitting: Women's Health

## 2021-04-21 ENCOUNTER — Encounter: Payer: Self-pay | Admitting: Adult Health

## 2021-04-21 ENCOUNTER — Other Ambulatory Visit (HOSPITAL_COMMUNITY)
Admission: RE | Admit: 2021-04-21 | Discharge: 2021-04-21 | Disposition: A | Discharge status: Home / Self Care | Payer: Medicaid Other | Source: Ambulatory Visit | Attending: Adult Health | Admitting: Adult Health

## 2021-04-21 ENCOUNTER — Ambulatory Visit (INDEPENDENT_AMBULATORY_CARE_PROVIDER_SITE_OTHER): Payer: Medicaid Other | Admitting: Adult Health

## 2021-04-21 ENCOUNTER — Other Ambulatory Visit: Payer: Self-pay

## 2021-04-21 VITALS — BP 140/87 | HR 80 | Ht 66.0 in | Wt 225.0 lb

## 2021-04-21 DIAGNOSIS — Z30013 Encounter for initial prescription of injectable contraceptive: Secondary | ICD-10-CM | POA: Diagnosis not present

## 2021-04-21 DIAGNOSIS — Z01419 Encounter for gynecological examination (general) (routine) without abnormal findings: Secondary | ICD-10-CM | POA: Diagnosis not present

## 2021-04-21 DIAGNOSIS — Z113 Encounter for screening for infections with a predominantly sexual mode of transmission: Secondary | ICD-10-CM | POA: Insufficient documentation

## 2021-04-21 DIAGNOSIS — F419 Anxiety disorder, unspecified: Secondary | ICD-10-CM | POA: Diagnosis not present

## 2021-04-21 DIAGNOSIS — Z3202 Encounter for pregnancy test, result negative: Secondary | ICD-10-CM | POA: Diagnosis not present

## 2021-04-21 DIAGNOSIS — F32A Depression, unspecified: Secondary | ICD-10-CM | POA: Diagnosis not present

## 2021-04-21 DIAGNOSIS — F17200 Nicotine dependence, unspecified, uncomplicated: Secondary | ICD-10-CM

## 2021-04-21 LAB — POCT URINE PREGNANCY: Preg Test, Ur: NEGATIVE

## 2021-04-21 MED ORDER — BUSPIRONE HCL 5 MG PO TABS: 5.0000 mg | ORAL_TABLET | Freq: Three times a day (TID) | ORAL | 3 refills | Status: AC

## 2021-04-21 MED ORDER — MEDROXYPROGESTERONE ACETATE 150 MG/ML IM SUSP: 150.0000 mg | INTRAMUSCULAR | 4 refills | Status: AC

## 2021-04-21 MED ORDER — MEDROXYPROGESTERONE ACETATE 150 MG/ML IM SUSP
150.0000 mg | Freq: Once | INTRAMUSCULAR | Status: AC
  Administered 2021-04-21: 150 mg via INTRAMUSCULAR

## 2021-04-21 MED ORDER — BUPROPION HCL ER (XL) 150 MG PO TB24: 150.0000 mg | ORAL_TABLET | Freq: Every day | ORAL | 6 refills | Status: AC

## 2021-04-21 NOTE — Progress Notes (Signed)
Patient ID: Holly Hodge, female   DOB: [birth year], 35 y.o.   MRN: [id number] History of Present Illness: Holly Hodge is a 35 year old black female, single, G3P3 in for a well woman gyn exam. And she wants to discuss birth control, and wants depo. Everlean Alstrom boyfriend died 54, dad died 11/13/2019, both in 12-13-2022.Has some depression, and  Working 2 jobs.  Lab Results  Component Value Date   DIAGPAP  08/15/2019    - Negative for intraepithelial lesion or malignancy (NILM)   HPV NOT DETECTED 03/11/2017   HPVHIGH Negative 08/15/2019      Current Medications, Allergies, Past Medical History, Past Surgical History, Family History and Social History were reviewed in [employer] record.     Review of Systems: Patient denies any headaches, hearing loss, fatigue, blurred vision, shortness of breath,  abdominal pain, problems with bowel movements, urination, or intercourse. No joint pain or mood swings.  Has pain in neck to check at times.  Not sleeping well    Physical Exam:BP 140/87 (BP Location: Left Arm, Patient Position: Sitting, Cuff Size: Large)   Pulse 80   Ht 5\' 6"  (1.676 m)   Wt 225 lb (102.1 kg)   BMI 36.32 kg/m  UPT is negative. No sex in 3 weeks or so.  General:  Well developed, well nourished, no acute distress Skin:  Warm and dry Neck:  Midline trachea, normal thyroid, good ROM, no lymphadenopathy Lungs; Clear to auscultation bilaterally Breast:  No dominant palpable mass, retraction, or nipple discharge Cardiovascular: Regular rate and rhythm Abdomen:  Soft, non tender, no hepatosplenomegaly Pelvic:  External genitalia is normal in appearance, no lesions.  The vagina is normal in appearance. Urethra has no lesions or masses. The cervix is bulbous.CV swab obtained.  Uterus is felt to be normal size, shape, and contour.  No adnexal masses or tenderness noted.Bladder is non tender, no masses felt. Rectal: Deferred. Extremities/musculoskeletal:  No swelling or varicosities  noted, no clubbing or cyanosis Psych:  No mood changes, alert and cooperative,seems happy AA is 6, consider going  to AA meeting Fall risk is low Depression screen [facility] 2/9 04/21/2021 08/15/2019 04/27/2018  Decreased Interest 2 2 0  Down, Depressed, Hopeless 2 3 2   PHQ - 2 Score 4 5 2   Altered sleeping 2 3 1   Tired, decreased energy 2 1 1   Change in appetite 2 3 1   Feeling bad or failure about yourself  1 1 1   Trouble concentrating 0 0 1  Moving slowly or fidgety/restless 0 1 0  Suicidal thoughts 0 0 0  PHQ-9 Score 11 14 7   Difficult doing work/chores - Somewhat difficult -    GAD 7 : Generalized Anxiety Score 04/21/2021  Nervous, Anxious, on Edge 1  Control/stop worrying 2  Worry too much - different things 2  Trouble relaxing 0  Restless 0  Easily annoyed or irritable 1  Afraid - awful might happen 0  Total GAD 7 Score 6      Upstream - 04/21/21 1347       Pregnancy Intention Screening   Does the patient want to become pregnant in the next year? No    Does the patient's partner want to become pregnant in the next year? No    Would the patient like to discuss contraceptive options today? Yes      Contraception Wrap Up   Current Method Female Condom    End Method Hormonal Injection    Contraception Counseling Provided Yes  Examination chaperoned by Malachy Mood LPN   Impression and Plan: 1. Pregnancy examination or test, negative result  2. Encounter for well woman exam with routine gynecological exam Physical in 1 year Pap in 2024  3. Encounter for initial prescription of injectable contraceptive First depo today in office and rx sent  Next depo in 12 weeks,use condoms    4. Screening examination for STD (sexually transmitted disease) CV swab sent for GC/CHL,trich and BV and yeast  5. Anxiety and depression Will rx wellbutrin and buspar Meds ordered this encounter  Medications   medroxyPROGESTERone (DEPO-PROVERA) 150 MG/ML injection    Sig:  Inject 1 mL (150 mg total) into the muscle every 3 (three) months.    Dispense:  1 mL    Refill:  4    Order Specific Question:   Supervising Provider    Answer:   Despina Hidden, LUTHER H [[id number]]   buPROPion (WELLBUTRIN XL) 150 MG 24 hr tablet    Sig: Take 1 tablet (150 mg total) by mouth daily.    Dispense:  30 tablet    Refill:  6    Order Specific Question:   Supervising Provider    Answer:   Despina Hidden, LUTHER H [[id number]]   busPIRone (BUSPAR) 5 MG tablet    Sig: Take 1 tablet (5 mg total) by mouth 3 (three) times daily.    Dispense:  90 tablet    Refill:  3    Order Specific Question:   Supervising Provider    Answer:   Duane Lope H [[id number]]    Follow up in 8 weeks for ROS  6. Smoker Try to decrease cigarettes, and pick quit date and Wellbutrin will help too

## 2021-04-23 LAB — CERVICOVAGINAL ANCILLARY ONLY
Bacterial Vaginitis (gardnerella): POSITIVE — AB
Candida Glabrata: NEGATIVE
Candida Vaginitis: NEGATIVE
Chlamydia: NEGATIVE
Comment: NEGATIVE
Comment: NEGATIVE
Comment: NEGATIVE
Comment: NEGATIVE
Comment: NEGATIVE
Comment: NORMAL
Neisseria Gonorrhea: NEGATIVE
Trichomonas: NEGATIVE

## 2021-04-24 ENCOUNTER — Other Ambulatory Visit: Payer: Self-pay | Admitting: Adult Health

## 2021-04-24 MED ORDER — METRONIDAZOLE 500 MG PO TABS: 500.0000 mg | ORAL_TABLET | Freq: Two times a day (BID) | ORAL | 0 refills | Status: DC

## 2021-04-24 NOTE — Progress Notes (Signed)
+  BV on vaginal swab will rx flagyl 

## 2021-04-29 ENCOUNTER — Telehealth: Payer: Self-pay | Admitting: Adult Health

## 2021-04-29 NOTE — Telephone Encounter (Signed)
Called patient back and informed her swab positive for BV and rx sent to [facility]. No other questions at this time.

## 2021-04-29 NOTE — Telephone Encounter (Signed)
Patient was calling about lab results.

## 2021-06-16 ENCOUNTER — Ambulatory Visit: Payer: Medicaid Other | Admitting: Adult Health

## 2021-07-14 ENCOUNTER — Ambulatory Visit: Payer: Medicaid Other

## 2021-07-15 ENCOUNTER — Ambulatory Visit: Payer: Medicaid Other | Admitting: *Deleted

## 2021-07-15 ENCOUNTER — Other Ambulatory Visit: Payer: Self-pay

## 2021-07-15 DIAGNOSIS — Z3042 Encounter for surveillance of injectable contraceptive: Secondary | ICD-10-CM

## 2021-07-15 MED ORDER — MEDROXYPROGESTERONE ACETATE 150 MG/ML IM SUSP
150.0000 mg | Freq: Once | INTRAMUSCULAR | Status: AC
  Administered 2021-07-15: 150 mg via INTRAMUSCULAR

## 2021-07-15 NOTE — Progress Notes (Signed)
° °  NURSE VISIT- INJECTION  SUBJECTIVE:  Holly Hodge is a 36 y.o. G80P3003 female here for a Depo Provera for contraception/period management. She is a GYN patient.   OBJECTIVE:  There were no vitals taken for this visit.  Appears well, in no apparent distress  Injection administered in: Left deltoid  Meds ordered this encounter  Medications   medroxyPROGESTERone (DEPO-PROVERA) injection 150 mg    ASSESSMENT: GYN patient Depo Provera for contraception/period management PLAN: Follow-up: in 11-13 weeks for next Depo   Jobe Marker  07/15/2021 3:54 PM

## 2021-09-17 DIAGNOSIS — F339 Major depressive disorder, recurrent, unspecified: Secondary | ICD-10-CM | POA: Diagnosis not present

## 2021-09-25 ENCOUNTER — Encounter (HOSPITAL_COMMUNITY): Payer: Self-pay | Admitting: *Deleted

## 2021-09-25 ENCOUNTER — Emergency Department (HOSPITAL_COMMUNITY)
Admission: EM | Admit: 2021-09-25 | Discharge: 2021-09-25 | Disposition: A | Discharge status: Home / Self Care | Payer: Medicaid Other | Attending: Emergency Medicine | Admitting: Emergency Medicine

## 2021-09-25 ENCOUNTER — Emergency Department (HOSPITAL_COMMUNITY): Payer: Medicaid Other

## 2021-09-25 DIAGNOSIS — R0789 Other chest pain: Secondary | ICD-10-CM | POA: Insufficient documentation

## 2021-09-25 DIAGNOSIS — R079 Chest pain, unspecified: Secondary | ICD-10-CM | POA: Diagnosis not present

## 2021-09-25 LAB — COMPREHENSIVE METABOLIC PANEL
ALT: 23 U/L (ref 0–44)
AST: 19 U/L (ref 15–41)
Albumin: 3.9 g/dL (ref 3.5–5.0)
Alkaline Phosphatase: 75 U/L (ref 38–126)
Anion gap: 9 (ref 5–15)
BUN: 9 mg/dL (ref 6–20)
CO2: 22 mmol/L (ref 22–32)
Calcium: 9 mg/dL (ref 8.9–10.3)
Chloride: 106 mmol/L (ref 98–111)
Creatinine, Ser: 0.72 mg/dL (ref 0.44–1.00)
GFR, Estimated: >60 mL/min (ref >60)
Glucose, Bld: 89 mg/dL (ref 70–99)
Potassium: 4.1 mmol/L (ref 3.5–5.1)
Sodium: 137 mmol/L (ref 135–145)
Total Bilirubin: 0.7 mg/dL (ref 0.3–1.2)
Total Protein: 7.2 g/dL (ref 6.5–8.1)

## 2021-09-25 LAB — D-DIMER, QUANTITATIVE: D-Dimer, Quant: 0.39 ug/mL-FEU (ref 0.00–0.50)

## 2021-09-25 LAB — CBC
HCT: 40.1 % (ref 36.0–46.0)
Hemoglobin: 13.4 g/dL (ref 12.0–15.0)
MCH: 33.3 pg (ref 26.0–34.0)
MCHC: 33.4 g/dL (ref 30.0–36.0)
MCV: 99.8 fL (ref 80.0–100.0)
Platelets: 376 10*3/uL (ref 150–400)
RBC: 4.02 MIL/uL (ref 3.87–5.11)
RDW: 13.6 % (ref 11.5–15.5)
WBC: 9.5 10*3/uL (ref 4.0–10.5)
nRBC: 0 % (ref 0.0–0.2)

## 2021-09-25 LAB — TROPONIN I (HIGH SENSITIVITY): Troponin I (High Sensitivity): 3 ng/L (ref <18)

## 2021-09-25 LAB — LIPASE, BLOOD: Lipase: 31 U/L (ref 11–51)

## 2021-09-25 MED ORDER — KETOROLAC TROMETHAMINE 30 MG/ML IJ SOLN
30.0000 mg | Freq: Once | INTRAMUSCULAR | Status: AC
  Administered 2021-09-25: 30 mg via INTRAVENOUS
  Filled 2021-09-25: qty 1

## 2021-09-25 MED ORDER — NAPROXEN 500 MG PO TABS: 500.0000 mg | ORAL_TABLET | Freq: Two times a day (BID) | ORAL | 0 refills | Status: AC

## 2021-09-25 MED ORDER — ALUM & MAG HYDROXIDE-SIMETH 200-200-20 MG/5ML PO SUSP
30.0000 mL | Freq: Once | ORAL | Status: AC
  Administered 2021-09-25: 30 mL via ORAL
  Filled 2021-09-25: qty 30

## 2021-09-25 MED ORDER — METHOCARBAMOL 500 MG PO TABS: 500.0000 mg | ORAL_TABLET | Freq: Two times a day (BID) | ORAL | 0 refills | Status: AC

## 2021-09-25 MED ORDER — LIDOCAINE VISCOUS HCL 2 % MT SOLN
15.0000 mL | Freq: Once | OROMUCOSAL | Status: AC
  Administered 2021-09-25: 15 mL via ORAL
  Filled 2021-09-25: qty 15

## 2021-09-25 NOTE — ED Provider Notes (Signed)
?[facility] EMERGENC[facility] DEPARTMENT ?Provider Note ? ? ?CSN: [id number] ?Arrival date & time: 09/25/21  1241 ? ?  ? ?History ?PMH: tobacco use, Pulmonary Embolism (2013) not on anticoagulation, obesity, and lupus ?No chief complaint on file. ? ? ?Holly Hodge is a 36 y.o. female. ?Patient presents the emergency department the chief complaint of chest pain.  She states that she started having the symptoms about 2 to 3 weeks ago.  She says the symptoms have been intermittent.  There are some days where she does not get them at all, however sometimes they are very severe.  She says they are worse at rest and when she is lying flat.  She describes the pain as being in the center of her chest goes across both breast.  It sometimes radiates into her back, neck, and shoulders.  She rates the pain about 7 out of 10 right now.  She denies any shortness of breath, nausea, vomiting, abdominal pain, numbness, or weakness of extremities.  She says that she has had chest pain in the past when she was diagnosed with a pulmonary embolism.  She says that she does not feel like her symptoms are similar to that episode though.  At that time, she was having associated shortness of breath and cough.  She was admitted in 2013 and put on anticoagulation.  She has since been off of this.  ? ?HPI ? ?  ? ?Home Medications ?Prior to Admission medications   ?Medication Sig Start Date End Date Taking? Authorizing Provider  ?methocarbamol (ROBAXIN) 500 MG tablet Take 1 tablet (500 mg total) by mouth 2 (two) times daily for 7 days. 09/25/21 10/02/21 Yes Kwesi Sangha, Finis Bud, PA-C  ?naproxen (NAPROSYN) 500 MG tablet Take 1 tablet (500 mg total) by mouth 2 (two) times daily for 7 days. 09/25/21 10/02/21 Yes Adasia Hoar, Finis Bud, PA-C  ?buPROPion (WELLBUTRIN XL) 150 MG 24 hr tablet Take 1 tablet (150 mg total) by mouth daily. 04/21/21   Adline Potter, NP  ?busPIRone (BUSPAR) 5 MG tablet Take 1 tablet (5 mg total) by mouth 3 (three) times daily. 04/21/21    Adline Potter, NP  ?Chlorphen-Phenyleph-Ibuprofen (ADVIL ALLERGY & CONGESTION PO) Take 1 tablet by mouth daily as needed.    [provider]  ?diphenhydrAMINE HCl (BENADRYL PO) Take by mouth.    [provider]  ?medroxyPROGESTERone (DEPO-PROVERA) 150 MG/ML injection Inject 1 mL (150 mg total) into the muscle every 3 (three) months. 04/21/21   Adline Potter, NP  ?metroNIDAZOLE (FLAGYL) 500 MG tablet Take 1 tablet (500 mg total) by mouth 2 (two) times daily. 04/24/21   Adline Potter, NP  ?   ? ?Allergies    ?Patient has no known allergies.   ? ?Review of Systems   ?Review of Systems  ?Respiratory:  Negative for shortness of breath.   ?Cardiovascular:  Positive for chest pain. Negative for leg swelling.  ?Gastrointestinal:  Negative for nausea and vomiting.  ?Musculoskeletal:  Positive for back pain and neck pain.  ?All other systems reviewed and are negative. ? ?Physical Exam ?Updated Vital Signs ?BP (!) 141/91 (BP Location: Left Arm)   Pulse 80   Temp 98.2 ?F (36.8 ?C) (Oral)   Resp 18   Ht 5\' 6"  (1.676 m)   Wt 108.9 kg   SpO2 100%   BMI 38.74 kg/m?  ?Physical Exam ?Vitals and nursing note reviewed.  ?Constitutional:   ?   General: She is not in acute distress. ?  Appearance: Normal appearance. She is not ill-appearing, toxic-appearing or diaphoretic.  ?HENT:  ?   Head: Normocephalic and atraumatic.  ?   Nose: No nasal deformity.  ?   Mouth/Throat:  ?   Lips: Pink. No lesions.  ?   Mouth: Mucous membranes are moist. No injury, lacerations, oral lesions or angioedema.  ?   Pharynx: Oropharynx is clear. Uvula midline. No pharyngeal swelling, oropharyngeal exudate, posterior oropharyngeal erythema or uvula swelling.  ?Eyes:  ?   General: Gaze aligned appropriately. No scleral icterus.    ?   Right eye: No discharge.     ?   Left eye: No discharge.  ?   Conjunctiva/sclera: Conjunctivae normal.  ?   Right eye: Right conjunctiva is not injected. No exudate or hemorrhage. ?    Left eye: Left conjunctiva is not injected. No exudate or hemorrhage. ?   Pupils: Pupils are equal, round, and reactive to light.  ?Cardiovascular:  ?   Rate and Rhythm: Normal rate and regular rhythm.  ?   Pulses: Normal pulses.     ?     Radial pulses are 2+ on the right side and 2+ on the left side.  ?     Dorsalis pedis pulses are 2+ on the right side and 2+ on the left side.  ?   Heart sounds: Normal heart sounds, S1 normal and S2 normal. Heart sounds not distant. No murmur heard. ?  No friction rub. No gallop. No S3 or S4 sounds.  ?Pulmonary:  ?   Effort: Pulmonary effort is normal. No accessory muscle usage or respiratory distress.  ?   Breath sounds: Normal breath sounds. No stridor. No wheezing, rhonchi or rales.  ?Chest:  ?   Chest wall: No tenderness.  ?Abdominal:  ?   General: Abdomen is flat. Bowel sounds are normal. There is no distension.  ?   Palpations: Abdomen is soft. There is no mass or pulsatile mass.  ?   Tenderness: There is no abdominal tenderness. There is no guarding or rebound.  ?Musculoskeletal:  ?   Right lower leg: No edema.  ?   Left lower leg: No edema.  ?Skin: ?   General: Skin is warm and dry.  ?   Coloration: Skin is not jaundiced or pale.  ?   Findings: No bruising, erythema, lesion or rash.  ?Neurological:  ?   General: No focal deficit present.  ?   Mental Status: She is alert and oriented to person, place, and time.  ?   GCS: GCS eye subscore is 4. GCS verbal subscore is 5. GCS motor subscore is 6.  ?Psychiatric:     ?   Mood and Affect: Mood normal.     ?   Behavior: Behavior normal. Behavior is cooperative.  ? ? ?ED Results / Procedures / Treatments   ?Labs ?(all labs ordered are listed, but only abnormal results are displayed) ?Labs Reviewed  ?CBC  ?D-DIMER, QUANTITATIVE  ?COMPREHENSIVE METABOLIC PANEL  ?LIPASE, BLOOD  ?TROPONIN I (HIGH SENSITIVITY)  ? ? ?EKG ?EKG Interpretation ? ?Date/Time:  Thursday September 25 2021 12:50:10 EDT ?Ventricular Rate:  75 ?PR Interval:  122 ?QRS  Duration: 78 ?QT Interval:  420 ?QTC Calculation: 469 ?R Axis:   44 ?Text Interpretation: Normal sinus rhythm T wave abnormality borderline long qt Abnormal ECG Confirmed by Gerhard Munch [phone]) on 09/25/2021 1:12:08 PM ? ?Radiology ?DG Chest Port 1 View ? ?Result Date: 09/25/2021 ?CLINICAL DATA:  Chest pain EXAM: PORTABLE  CHEST 1 VIEW COMPARISON:  Chest radiograph 03/08/2012 FINDINGS: The cardiomediastinal silhouette is normal. There is no focal consolidation or pulmonary edema. There is no pleural effusion or pneumothorax There is no acute osseous abnormality. IMPRESSION: No radiographic evidence of acute cardiopulmonary process. Electronically Signed   By: Lesia HausenPeter  Noone M.D.   On: 09/25/2021 14:41   ? ?Procedures ?Procedures  ?This patient was on telemetry or cardiac monitoring during their time in the ED.  ? ? ?Medications Ordered in ED ?Medications  ?alum & mag hydroxide-simeth (MAALOX/MYLANTA) 200-200-20 MG/5ML suspension 30 mL (30 mLs Oral Given 09/25/21 1451)  ?  And  ?lidocaine (XYLOCAINE) 2 % viscous mouth solution 15 mL (15 mLs Oral Given 09/25/21 1451)  ?ketorolac (TORADOL) 30 MG/ML injection 30 mg (30 mg Intravenous Given 09/25/21 1515)  ? ? ?ED Course/ Medical Decision Making/ A&P ?  ?                        ?Medical Decision Making ?Amount and/or Complexity of Data Reviewed ?Labs: ordered. ?Radiology: ordered. ? ?Risk ?OTC drugs. ?Prescription drug management. ? ? ? ?MDM  ?This is a 36 y.o. female who presents to the ED with chest pain ?The differential of this patient includes but is not limited to ACS/NSTEMI, Aortic Dissection, Esophageal Rupture, PE, PTX, Tamponade, Endocarditis, GERD, MSK, Pericarditis, PNA, and Dermatomal Rash ? ?My Impression, Plan, and ED Course: Patient is well-appearing in no acute distress.  The timeline of her chest pain is reassuring.  It does not seem to be associated with exertion.  No associated symptoms such as shortness of breath, nausea, vomiting, leg swelling that are  concerning.  I suspect that this is gastrointestinal or MSK, however will work-up for ACS.  She also has a history of pulmonary embolism and is not on blood thinners.  This is a different presentation, however

## 2021-09-25 NOTE — Discharge Instructions (Addendum)
Your workup was reassuring today. I think that your pain is likely caused by a musculoskeletal cause. I have prescribed you a weeks worth of muscle relaxer and antiinflammatories.  ?I have also referred you to a primary care in this area. Please set up an appointment so that you can become established.  ? ?If your pain does not improve with treatment, please return to see [place] again if you are not able to get in with your PCP.  ?

## 2021-09-25 NOTE — ED Triage Notes (Signed)
Multiple complaints, chest pain, constipation, back pain x 2-3 weeks ?

## 2021-09-26 ENCOUNTER — Telehealth: Payer: Self-pay

## 2021-09-26 NOTE — Telephone Encounter (Signed)
Transition Care Management Unsuccessful Follow-up Telephone Call ? ?Date of discharge and from where:  09/25/2021-[facility] ? ?Attempts:  1st Attempt ? ?Reason for unsuccessful TCM follow-up call:  Voice mail full ? ?  ?

## 2021-09-29 NOTE — Telephone Encounter (Signed)
Transition Care Management Unsuccessful Follow-up Telephone Call ? ?Date of discharge and from where:  09/25/2021 from [facility] ? ?Attempts:  2nd Attempt ? ?Reason for unsuccessful TCM follow-up call:  Voice mail full ? ? ? ?

## 2021-10-01 NOTE — Telephone Encounter (Signed)
Transition Care Management Unsuccessful Follow-up Telephone Call ? ?Date of discharge and from where:  09/25/2021 from [facility] ? ?Attempts:  3rd Attempt ? ?Reason for unsuccessful TCM follow-up call:  Unable to reach patient ? ? ? ?

## 2021-10-07 ENCOUNTER — Ambulatory Visit: Payer: Medicaid Other

## 2021-10-13 ENCOUNTER — Ambulatory Visit: Payer: Medicaid Other

## 2021-11-10 ENCOUNTER — Ambulatory Visit (INDEPENDENT_AMBULATORY_CARE_PROVIDER_SITE_OTHER): Payer: Medicaid Other | Admitting: Adult Health

## 2021-11-10 ENCOUNTER — Encounter: Payer: Self-pay | Admitting: Adult Health

## 2021-11-10 VITALS — BP 131/81 | HR 84 | Ht 66.0 in | Wt 227.0 lb

## 2021-11-10 DIAGNOSIS — Z30017 Encounter for initial prescription of implantable subdermal contraceptive: Secondary | ICD-10-CM

## 2021-11-10 DIAGNOSIS — Z3202 Encounter for pregnancy test, result negative: Secondary | ICD-10-CM

## 2021-11-10 LAB — POCT URINE PREGNANCY: Preg Test, Ur: NEGATIVE

## 2021-11-10 MED ORDER — ETONOGESTREL 68 MG ~~LOC~~ IMPL
68.0000 mg | DRUG_IMPLANT | Freq: Once | SUBCUTANEOUS | Status: AC
  Administered 2021-11-10: 68 mg via SUBCUTANEOUS

## 2021-11-10 NOTE — Progress Notes (Signed)
?  Subjective:  ?  ? Patient ID: Holly Hodge, female   DOB: 1985/12/28, 36 y.o.   MRN: [id number] ? ?HPI ?Holly Hodge is a 36 year old black female, single, G3P3 51 in to discuss birth control, she wants nexplanon, she had hair loss with depo. She has history of PE and is a smoker. ? ?Lab Results  ?Component Value Date  ? DIAGPAP  08/15/2019  ?  - Negative for intraepithelial lesion or malignancy (NILM)  ? HPV NOT DETECTED 03/11/2017  ? HPVHIGH Negative 08/15/2019  ?  ?Review of Systems ?For nexplanon insertion ?No sex in over a month ?Reviewed past medical,surgical, social and family history. Reviewed medications and allergies.  ?   ?Objective:  ? Physical Exam ?BP 131/81 (BP Location: Right Arm, Patient Position: Sitting, Cuff Size: Normal)   Pulse 84   Ht 5\' 6"  (1.676 m)   Wt 227 lb (103 kg)   BMI 36.64 kg/m?  UPT is negative.  ?  Consent signed, time out called. Left arm cleansed with betadine, and injected with 1.5 cc 2% lidocaine and waited til numb. Nexplanon easily inserted and steri strips applied.Rod easily palpated by provider and pt. Pressure dressing applied.  ? [facility] - 11/10/21 1413   ? ?  ? Pregnancy Intention Screening  ? Does the patient want to become pregnant in the next year? No   ? Does the patient's partner want to become pregnant in the next year? No   ? Would the patient like to discuss contraceptive options today? Yes   ?  ? Contraception Wrap Up  ? Current Method Abstinence   ? End Method Hormonal Implant   ? Contraception Counseling Provided --   ? ?  ?  ? ?  ?  ?Assessment:  ?   ?1. Pregnancy test negative ? ?2. Nexplanon insertion ?Lot # 01/10/22 ?Exp [id number] ? ?   ?Plan:  ?  Return in about 9 months for pap and physical  ?Use condoms x 2 weeks, keep clean and dry x 24 hours, no heavy lifting, keep steri strips on x 72 hours, Keep pressure dressing on x 24 hours. Follow up prn problems.  ?   ?

## 2021-11-10 NOTE — Patient Instructions (Signed)
Use condoms x 2 weeks, keep clean and dry x 24 hours, no heavy lifting, keep steri strips on x 72 hours, Keep pressure dressing on x 24 hours. Follow up prn problems.  

## 2021-11-10 NOTE — Addendum Note (Signed)
Addended by: Annamarie Dawley on: 11/10/2021 04:54 PM ? ? Modules accepted: Orders ? ?

## 2021-12-03 ENCOUNTER — Ambulatory Visit: Payer: Medicaid Other | Admitting: Family Medicine

## 2021-12-23 DIAGNOSIS — F339 Major depressive disorder, recurrent, unspecified: Secondary | ICD-10-CM | POA: Diagnosis not present

## 2022-04-24 ENCOUNTER — Encounter: Payer: Self-pay | Admitting: Emergency Medicine

## 2022-04-24 ENCOUNTER — Ambulatory Visit
Admission: EM | Admit: 2022-04-24 | Discharge: 2022-04-24 | Disposition: A | Discharge status: Home / Self Care | Payer: Medicaid Other | Attending: Family Medicine | Admitting: Family Medicine

## 2022-04-24 DIAGNOSIS — J069 Acute upper respiratory infection, unspecified: Secondary | ICD-10-CM | POA: Diagnosis not present

## 2022-04-24 DIAGNOSIS — Z20822 Contact with and (suspected) exposure to covid-19: Secondary | ICD-10-CM | POA: Diagnosis not present

## 2022-04-24 LAB — RESP PANEL BY RT-PCR (FLU A&B, COVID) ARPGX2
Influenza A by PCR: NEGATIVE
Influenza B by PCR: NEGATIVE
SARS Coronavirus 2 by RT PCR: NEGATIVE

## 2022-04-24 MED ORDER — PROMETHAZINE-DM 6.25-15 MG/5ML PO SYRP: 5.0000 mL | ORAL_SOLUTION | Freq: Four times a day (QID) | ORAL | 0 refills | Status: AC | PRN

## 2022-04-24 NOTE — ED Triage Notes (Signed)
Exposure to covid.  States she developed a fever yesterday and body aches started yesterday.  Has been taking cold and flu medication.

## 2022-04-24 NOTE — ED Provider Notes (Signed)
[facility]    CSN: [id number] Arrival date & time: 04/24/22  1745      History   Chief Complaint No chief complaint on file.   HPI Holly Hodge is a 36 y.o. female.   Patient presenting today with 2 day history of fever, body aches, congestion, cough, fatigue.  Denies chest pain, shortness of breath, abdominal pain, nausea vomiting or diarrhea.  So far trying cold and flu medication with mild temporary relief of symptoms.  Recent exposure to COVID-19.  No known history of chronic pulmonary disease though does have a history of PE.    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   PE (pulmonary embolism) 2013   Vaginal discharge 08/09/2014   Vaginal irritation 12/24/2014   Yeast infection 12/24/2014    Patient Active Problem List   Diagnosis Date Noted   Pregnancy test negative 11/10/2021   Pregnancy examination or test, negative result 04/21/2021   Screening examination for STD (sexually transmitted disease) 04/21/2021   Anxiety and depression 04/21/2021   Smoker 04/21/2021   Costochondritis 08/15/2019   Encounter for gynecological examination with Papanicolaou smear of cervix 08/15/2019   Vaginal irritation 08/15/2019   Depression 04/27/2018   Encounter for well woman exam with routine gynecological exam 04/27/2018   Nexplanon insertion 12/06/2014   Abnormal Pap smear of cervix 05/21/2014   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/11/2012   Nausea and vomiting 03/09/2012   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/08/2012   Tobacco abuse 03/08/2012   Obesity 03/08/2012   Menorrhagia 03/08/2012    Past Surgical History:  Procedure Laterality Date   CESAREAN SECTION     x2   CESAREAN SECTION N/A 02/14/2014   Procedure: CESAREAN SECTION;  Surgeon: Adam Phenix, MD;  Location: [facility];  Service: Obstetrics;  Laterality: N/A;   CHOLECYSTECTOMY  2005   WISDOM TOOTH  EXTRACTION      OB History     Gravida  3   Para  3   Term  3   Preterm      AB      Living  3      SAB      IAB      Ectopic      Multiple      Live Births  3            Home Medications    Prior to Admission medications   Medication Sig Start Date End Date Taking? Authorizing Provider  promethazine-dextromethorphan (PROMETHAZINE-DM) 6.25-15 MG/5ML syrup Take 5 mLs by mouth 4 (four) times daily as needed. 04/24/22  Yes Particia Nearing, PA-C  buPROPion (WELLBUTRIN XL) 150 MG 24 hr tablet Take 1 tablet (150 mg total) by mouth daily. Patient not taking: Reported on 11/10/2021 04/21/21   Adline Potter, NP  busPIRone (BUSPAR) 5 MG tablet Take 1 tablet (5 mg total) by mouth 3 (three) times daily. Patient not taking: Reported on 11/10/2021 04/21/21   Adline Potter, NP  Chlorphen-Phenyleph-Ibuprofen (ADVIL ALLERGY & CONGESTION PO) Take 1 tablet by mouth daily as needed. Patient not taking: Reported on 11/10/2021    [provider]  diphenhydrAMINE HCl (BENADRYL PO) Take by mouth.    [provider]  medroxyPROGESTERone (DEPO-PROVERA) 150 MG/ML injection Inject 1 mL (150 mg total) into the muscle every 3 (three) months. Patient not taking: Reported on 11/10/2021 04/21/21   Adline Potter,  NP    Family History Family History  Problem Relation Age of Onset   Hypertension Mother    Hypertension Father    Stroke Father    Cancer Paternal Grandmother    Cancer Other        runs on both sides of family    Social History Social History   Tobacco Use   Smoking status: Every Day    Packs/day: 1.00    Years: 12.00    Total pack years: 12.00    Types: Cigarettes   Smokeless tobacco: Never  Vaping Use   Vaping Use: Never used  Substance Use Topics   Alcohol use: Yes    Comment: occ   Drug use: Yes    Types: Marijuana    Comment: 3-4 days a week     Allergies   Patient has no known allergies.   Review of  Systems Review of Systems Per HPI  Physical Exam Triage Vital Signs ED Triage Vitals  Enc Vitals Group     BP 04/24/22 1751 (!) 145/83     Pulse Rate 04/24/22 1751 89     Resp 04/24/22 1751 18     Temp 04/24/22 1751 98.3 F (36.8 C)     Temp Source 04/24/22 1751 Oral     SpO2 04/24/22 1751 95 %     Weight --      Height --      Head Circumference --      Peak Flow --      Pain Score 04/24/22 1753 6     Pain Loc --      Pain Edu? --      Excl. in [place]? --    No data found.  Updated Vital Signs BP (!) 145/83 (BP Location: Right Arm)   Pulse 89   Temp 98.3 F (36.8 C) (Oral)   Resp 18   SpO2 95%   Visual Acuity Right Eye Distance:   Left Eye Distance:   Bilateral Distance:    Right Eye Near:   Left Eye Near:    Bilateral Near:     Physical Exam Vitals and nursing note reviewed.  Constitutional:      Appearance: Normal appearance.  HENT:     Head: Atraumatic.     Right Ear: Tympanic membrane and external ear normal.     Left Ear: Tympanic membrane and external ear normal.     Nose: Rhinorrhea present.     Mouth/Throat:     Mouth: Mucous membranes are moist.     Pharynx: Posterior oropharyngeal erythema present.  Eyes:     Extraocular Movements: Extraocular movements intact.     Conjunctiva/sclera: Conjunctivae normal.  Cardiovascular:     Rate and Rhythm: Normal rate and regular rhythm.     Heart sounds: Normal heart sounds.  Pulmonary:     Effort: Pulmonary effort is normal.     Breath sounds: Normal breath sounds. No wheezing or rales.  Musculoskeletal:        General: Normal range of motion.     Cervical back: Normal range of motion and neck supple.  Skin:    General: Skin is warm and dry.  Neurological:     Mental Status: She is alert and oriented to person, place, and time.     Motor: No weakness.     Gait: Gait normal.  Psychiatric:        Mood and Affect: Mood normal.        Thought  Content: Thought content normal.      UC Treatments /  Results  Labs (all labs ordered are listed, but only abnormal results are displayed) Labs Reviewed  RESP PANEL BY RT-PCR (FLU A&B, COVID) [id number]    EKG   Radiology No results found.  Procedures Procedures (including critical care time)  Medications Ordered in UC Medications - No data to display  Initial Impression / Assessment and Plan / UC Course  I have reviewed the triage vital signs and the nursing notes.  Pertinent labs & imaging results that were available during my care of the patient were reviewed by me and considered in my medical decision making (see chart for details).     Vitals and exam overall reassuring, suspect viral upper respiratory infection.  Respiratory panel pending, treat with Phenergan DM, supportive over-the-counter medications and home care.  Return for any worsening symptoms.  Work note given.  Final Clinical Impressions(s) / UC Diagnoses   Final diagnoses:  Viral URI with cough  Exposure to COVID-19 virus   Discharge Instructions   None    ED Prescriptions     Medication Sig Dispense Auth. Provider   promethazine-dextromethorphan (PROMETHAZINE-DM) 6.25-15 MG/5ML syrup Take 5 mLs by mouth 4 (four) times daily as needed. 100 mL Particia Nearing, [place]      PDMP not reviewed this encounter.   Particia Nearing, [place] 04/24/22 [phone]

## 2022-06-05 IMAGING — DX DG CHEST 1V PORT
1 series · 1 of 1 positions shown · non-contrast
Comparison: Chest radiograph 03/08/2012

CLINICAL DATA: Chest pain

EXAM:
PORTABLE CHEST 1 VIEW

[chest ap]
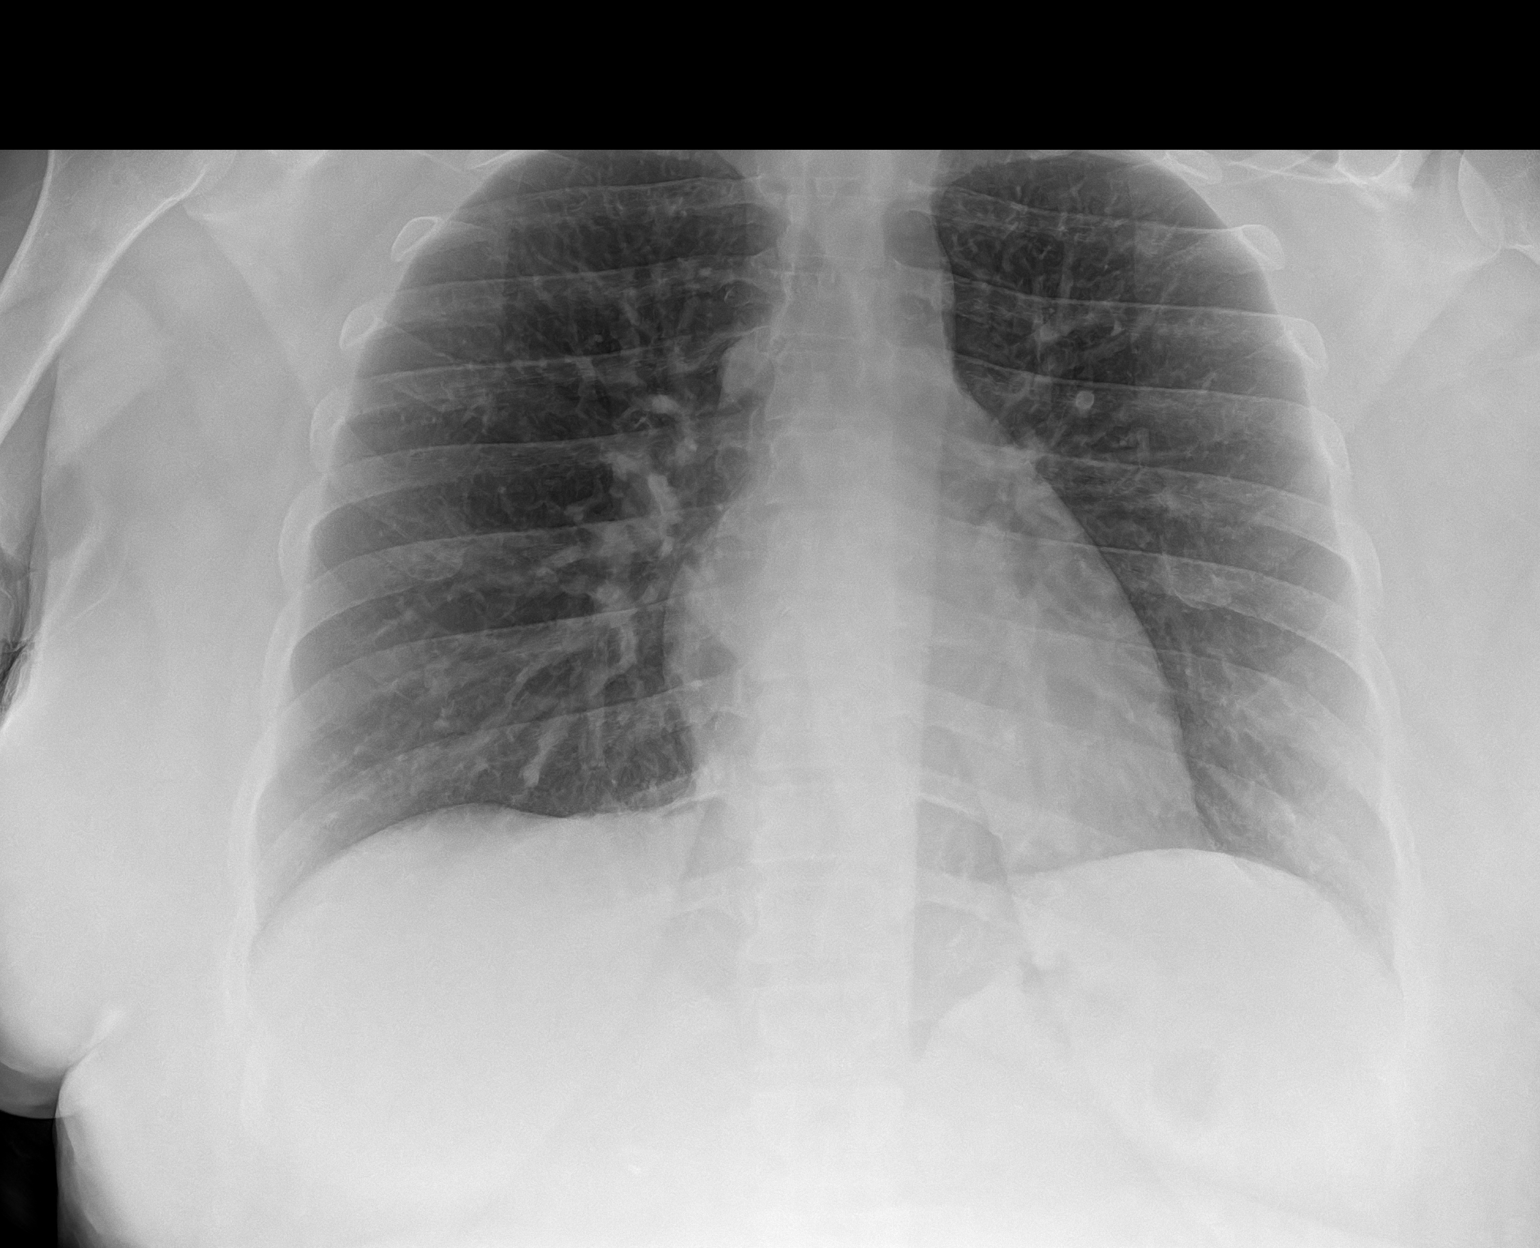

[1 of 1 positions shown; findings below may reference images not displayed]

FINDINGS: The cardiomediastinal silhouette is normal.

There is no focal consolidation or pulmonary edema. There is no
pleural effusion or pneumothorax

There is no acute osseous abnormality.
IMPRESSION: No radiographic evidence of acute cardiopulmonary process.

## 2023-04-05 ENCOUNTER — Ambulatory Visit
Admission: EM | Admit: 2023-04-05 | Discharge: 2023-04-05 | Disposition: A | Discharge status: Home / Self Care | Payer: Medicaid Other | Attending: Family Medicine | Admitting: Family Medicine

## 2023-04-05 DIAGNOSIS — R220 Localized swelling, mass and lump, head: Secondary | ICD-10-CM | POA: Diagnosis not present

## 2023-04-05 MED ORDER — CEPHALEXIN 500 MG PO CAPS: 500.0000 mg | ORAL_CAPSULE | Freq: Two times a day (BID) | ORAL | 0 refills | Status: AC

## 2023-04-05 NOTE — ED Provider Notes (Addendum)
[facility]    CSN: [id number] Arrival date & time: 04/05/23  1337      History   Chief Complaint No chief complaint on file.   HPI Holly Hodge is a 37 y.o. female.   Patient presenting today with pain, swelling that started under the nose on the right side and is now spreading to the right cheek x 3 days.  States it is painful and feels tingly with smiling due to the swelling.  Denies obvious sores, fever, headache, dental pain, difficulty breathing or swallowing.  Using ibuprofen and cool compresses with mild temporary benefit.  She does use cocaine, last use was the day before onset of symptoms.    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   PE (pulmonary embolism) 2013   Vaginal discharge 08/09/2014   Vaginal irritation 12/24/2014   Yeast infection 12/24/2014    Patient Active Problem List   Diagnosis Date Noted   Pregnancy test negative 11/10/2021   Pregnancy examination or test, negative result 04/21/2021   Screening examination for STD (sexually transmitted disease) 04/21/2021   Anxiety and depression 04/21/2021   Smoker 04/21/2021   Costochondritis 08/15/2019   Encounter for gynecological examination with Papanicolaou smear of cervix 08/15/2019   Vaginal irritation 08/15/2019   Depression 04/27/2018   Encounter for well woman exam with routine gynecological exam 04/27/2018   Nexplanon insertion 12/06/2014   Abnormal Pap smear of cervix 05/21/2014   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/11/2012   Nausea and vomiting 03/09/2012   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/08/2012   Tobacco abuse 03/08/2012   Obesity 03/08/2012   Menorrhagia 03/08/2012    Past Surgical History:  Procedure Laterality Date   CESAREAN SECTION     x2   CESAREAN SECTION N/A 02/14/2014   Procedure: CESAREAN SECTION;  Surgeon: Adam Phenix, MD;  Location: [facility];  Service:  Obstetrics;  Laterality: N/A;   CHOLECYSTECTOMY  2005   WISDOM TOOTH EXTRACTION      OB History     Gravida  3   Para  3   Term  3   Preterm      AB      Living  3      SAB      IAB      Ectopic      Multiple      Live Births  3            Home Medications    Prior to Admission medications   Medication Sig Start Date End Date Taking? Authorizing Provider  cephALEXin (KEFLEX) 500 MG capsule Take 1 capsule (500 mg total) by mouth 2 (two) times daily. 04/05/23  Yes Particia Nearing, PA-C  buPROPion (WELLBUTRIN XL) 150 MG 24 hr tablet Take 1 tablet (150 mg total) by mouth daily. Patient not taking: Reported on 11/10/2021 04/21/21   Adline Potter, NP  busPIRone (BUSPAR) 5 MG tablet Take 1 tablet (5 mg total) by mouth 3 (three) times daily. Patient not taking: Reported on 11/10/2021 04/21/21   Adline Potter, NP  Chlorphen-Phenyleph-Ibuprofen (ADVIL ALLERGY & CONGESTION PO) Take 1 tablet by mouth daily as needed. Patient not taking: Reported on 11/10/2021    [provider]  diphenhydrAMINE HCl (BENADRYL PO) Take by mouth.    [provider]  medroxyPROGESTERone (DEPO-PROVERA) 150 MG/ML injection Inject 1 mL (150 mg total) into the muscle  every 3 (three) months. Patient not taking: Reported on 11/10/2021 04/21/21   Adline Potter, NP  promethazine-dextromethorphan (PROMETHAZINE-DM) 6.25-15 MG/5ML syrup Take 5 mLs by mouth 4 (four) times daily as needed. 04/24/22   Particia Nearing, PA-C    Family History Family History  Problem Relation Age of Onset   Hypertension Mother    Hypertension Father    Stroke Father    Cancer Paternal Grandmother    Cancer Other        runs on both sides of family   Social History Social History   Tobacco Use   Smoking status: Every Day    Current packs/day: 1.00    Average packs/day: 1 pack/day for 12.0 years (12.0 ttl pk-yrs)    Types: Cigarettes   Smokeless tobacco: Never  Vaping Use    Vaping status: Never Used  Substance Use Topics   Alcohol use: Yes    Comment: occ   Drug use: Yes    Types: Marijuana, Cocaine    Comment: 3-4 days a week   Allergies   Patient has no known allergies.  Review of Systems Review of Systems PER HPI  Physical Exam Triage Vital Signs ED Triage Vitals  Encounter Vitals Group     BP 04/05/23 1355 (!) 140/86     Systolic BP Percentile --      Diastolic BP Percentile --      Pulse Rate 04/05/23 1355 86     Resp 04/05/23 1355 16     Temp 04/05/23 1355 98.1 F (36.7 C)     Temp Source 04/05/23 1355 Oral     SpO2 04/05/23 1355 97 %     Weight --      Height --      Head Circumference --      Peak Flow --      Pain Score 04/05/23 1358 0     Pain Loc --      Pain Education --      Exclude from Growth Chart --    No data found.  Updated Vital Signs BP (!) 140/86 (BP Location: Right Arm)   Pulse 86   Temp 98.1 F (36.7 C) (Oral)   Resp 16   SpO2 97%   Visual Acuity Right Eye Distance:   Left Eye Distance:   Bilateral Distance:    Right Eye Near:   Left Eye Near:    Bilateral Near:     Physical Exam Vitals and nursing note reviewed.  Constitutional:      Appearance: Normal appearance. She is not ill-appearing.  HENT:     Head: Atraumatic.     Comments: Right-sided facial tenderness to palpation, trace edema worse just below nose but extending to right medial cheek.  No fluctuance or induration palpable    Mouth/Throat:     Mouth: Mucous membranes are moist.     Pharynx: Oropharynx is clear.     Comments: No obvious abnormalities within the oral cavity Eyes:     Extraocular Movements: Extraocular movements intact.     Conjunctiva/sclera: Conjunctivae normal.     Pupils: Pupils are equal, round, and reactive to light.  Cardiovascular:     Rate and Rhythm: Normal rate and regular rhythm.     Heart sounds: Normal heart sounds.  Pulmonary:     Effort: Pulmonary effort is normal.     Breath sounds: Normal  breath sounds.  Abdominal:     General: Bowel sounds are normal. There is no  distension.     Palpations: Abdomen is soft.     Tenderness: There is no abdominal tenderness. There is no guarding.  Musculoskeletal:        General: Normal range of motion.     Cervical back: Normal range of motion and neck supple.  Skin:    General: Skin is warm and dry.     Findings: No bruising or erythema.  Neurological:     General: No focal deficit present.     Mental Status: She is alert and oriented to person, place, and time.     Cranial Nerves: No cranial nerve deficit.     Motor: No weakness.     Gait: Gait normal.  Psychiatric:        Mood and Affect: Mood normal.        Thought Content: Thought content normal.        Judgment: Judgment normal.    UC Treatments / Results  Labs (all labs ordered are listed, but only abnormal results are displayed) Labs Reviewed - No data to display  EKG  Radiology No results found.  Procedures Procedures (including critical care time)  Medications Ordered in UC Medications - No data to display  Initial Impression / Assessment and Plan / UC Course  I have reviewed the triage vital signs and the nursing notes.  Pertinent labs & imaging results that were available during my care of the patient were reviewed by me and considered in my medical decision making (see chart for details).     Will cover with Keflex for possible infectious cause, discussed cool compresses, ibuprofen, close follow-up for unresolving symptoms.  No focal neurologic deficits today or red flag findings.  Final Clinical Impressions(s) / UC Diagnoses   Final diagnoses:  Facial swelling     Discharge Instructions      We will treat with antibiotics, cool compresses, ibuprofen as needed.  Follow-up if your symptoms continue to worsen or do not get better    ED Prescriptions     Medication Sig Dispense Auth. Provider   cephALEXin (KEFLEX) 500 MG capsule Take 1 capsule  (500 mg total) by mouth 2 (two) times daily. 14 capsule Particia Nearing, [place]      PDMP not reviewed this encounter.   Particia Nearing, [place] 04/05/23 1436    Particia Nearing, [place] 04/05/23 1438

## 2023-04-05 NOTE — ED Triage Notes (Addendum)
Pt c/o facial swelling, started under the nose on the right side, and has spread all over th right side of the face, pt states she is unable to smile. Pt states she uses cocaine, last use was Friday night numbness started Saturday.

## 2023-04-05 NOTE — Discharge Instructions (Signed)
We will treat with antibiotics, cool compresses, ibuprofen as needed.  Follow-up if your symptoms continue to worsen or do not get better

## 2023-04-19 ENCOUNTER — Ambulatory Visit
Admission: EM | Admit: 2023-04-19 | Discharge: 2023-04-19 | Disposition: A | Discharge status: Home / Self Care | Payer: Medicaid Other | Attending: Family Medicine | Admitting: Family Medicine

## 2023-04-19 DIAGNOSIS — M549 Dorsalgia, unspecified: Secondary | ICD-10-CM | POA: Diagnosis not present

## 2023-04-19 DIAGNOSIS — J309 Allergic rhinitis, unspecified: Secondary | ICD-10-CM

## 2023-04-19 MED ORDER — DEXAMETHASONE SODIUM PHOSPHATE 10 MG/ML IJ SOLN
10.0000 mg | Freq: Once | INTRAMUSCULAR | Status: AC
  Administered 2023-04-19: 10 mg via INTRAMUSCULAR

## 2023-04-19 MED ORDER — CETIRIZINE HCL 10 MG PO TABS: 10.0000 mg | ORAL_TABLET | Freq: Every day | ORAL | 2 refills | Status: AC

## 2023-04-19 MED ORDER — FLUTICASONE PROPIONATE 50 MCG/ACT NA SUSP: 1.0000 | Freq: Two times a day (BID) | NASAL | 2 refills | Status: AC

## 2023-04-19 MED ORDER — TIZANIDINE HCL 4 MG PO CAPS: 4.0000 mg | ORAL_CAPSULE | Freq: Three times a day (TID) | ORAL | 0 refills | Status: DC | PRN

## 2023-04-19 NOTE — ED Triage Notes (Signed)
Pt presents with neck pain at top of her shoulders x 2 days. Taking ibuprofen with no relief of pain.

## 2023-04-19 NOTE — ED Provider Notes (Signed)
[facility]    CSN: [id number] Arrival date & time: 04/19/23  1531      History   Chief Complaint Chief Complaint  Patient presents with   Neck Pain    HPI Holly Hodge is a 37 y.o. female.   Patient presenting today with 2-day history of bilateral shoulders and neck soreness, stiffness.  Denies known injury, thinks she may have slept wrong.  Denies any decreased range of motion, numbness, tingling, swelling or discoloration of extremities.  Trying ibuprofen with minimal relief.  She is also having significant sinus pressure, congestion for the past 2 weeks or so.  Trying over-the-counter decongestants with mild temporary benefit.  Was treated for a sinus infection several weeks ago which helped temporarily.    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   PE (pulmonary embolism) 2013   Vaginal discharge 08/09/2014   Vaginal irritation 12/24/2014   Yeast infection 12/24/2014    Patient Active Problem List   Diagnosis Date Noted   Pregnancy test negative 11/10/2021   Pregnancy examination or test, negative result 04/21/2021   Screening examination for STD (sexually transmitted disease) 04/21/2021   Anxiety and depression 04/21/2021   Smoker 04/21/2021   Costochondritis 08/15/2019   Encounter for gynecological examination with Papanicolaou smear of cervix 08/15/2019   Vaginal irritation 08/15/2019   Depression 04/27/2018   Encounter for well woman exam with routine gynecological exam 04/27/2018   Nexplanon insertion 12/06/2014   Abnormal Pap smear of cervix 05/21/2014   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/11/2012   Nausea and vomiting 03/09/2012   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/08/2012   Tobacco abuse 03/08/2012   Obesity 03/08/2012   Menorrhagia 03/08/2012    Past Surgical History:  Procedure Laterality Date   CESAREAN SECTION     x2   CESAREAN SECTION  N/A 02/14/2014   Procedure: CESAREAN SECTION;  Surgeon: Adam Phenix, MD;  Location: [facility];  Service: Obstetrics;  Laterality: N/A;   CHOLECYSTECTOMY  2005   WISDOM TOOTH EXTRACTION      OB History     Gravida  3   Para  3   Term  3   Preterm      AB      Living  3      SAB      IAB      Ectopic      Multiple      Live Births  3            Home Medications    Prior to Admission medications   Medication Sig Start Date End Date Taking? Authorizing Provider  cephALEXin (KEFLEX) 500 MG capsule Take 1 capsule (500 mg total) by mouth 2 (two) times daily. 04/05/23  Yes Particia Nearing, PA-C  cetirizine (ZYRTEC ALLERGY) 10 MG tablet Take 1 tablet (10 mg total) by mouth daily. 04/19/23  Yes Particia Nearing, PA-C  fluticasone [facility]) 50 MCG/ACT nasal spray Place 1 spray into both nostrils 2 (two) times daily. 04/19/23  Yes Particia Nearing, PA-C  tiZANidine (ZANAFLEX) 4 MG capsule Take 1 capsule (4 mg total) by mouth 3 (three) times daily as needed for muscle spasms. Do not drink alcohol or drive while taking this medication.  May cause drowsiness. 04/19/23  Yes Particia Nearing, PA-C  buPROPion (WELLBUTRIN XL) 150 MG 24 hr tablet Take 1 tablet (150 mg total) by mouth  daily. Patient not taking: Reported on 11/10/2021 04/21/21   Adline Potter, NP  busPIRone (BUSPAR) 5 MG tablet Take 1 tablet (5 mg total) by mouth 3 (three) times daily. Patient not taking: Reported on 11/10/2021 04/21/21   Adline Potter, NP  Chlorphen-Phenyleph-Ibuprofen (ADVIL ALLERGY & CONGESTION PO) Take 1 tablet by mouth daily as needed. Patient not taking: Reported on 11/10/2021    [provider]  diphenhydrAMINE HCl (BENADRYL PO) Take by mouth.    [provider]  medroxyPROGESTERone (DEPO-PROVERA) 150 MG/ML injection Inject 1 mL (150 mg total) into the muscle every 3 (three) months. Patient not taking: Reported on 11/10/2021 04/21/21   Adline Potter, NP  promethazine-dextromethorphan (PROMETHAZINE-DM) 6.25-15 MG/5ML syrup Take 5 mLs by mouth 4 (four) times daily as needed. 04/24/22   Particia Nearing, PA-C    Family History Family History  Problem Relation Age of Onset   Hypertension Mother    Hypertension Father    Stroke Father    Cancer Paternal Grandmother    Cancer Other        runs on both sides of family    Social History Social History   Tobacco Use   Smoking status: Every Day    Current packs/day: 1.00    Average packs/day: 1 pack/day for 12.0 years (12.0 ttl pk-yrs)    Types: Cigarettes   Smokeless tobacco: Never  Vaping Use   Vaping status: Never Used  Substance Use Topics   Alcohol use: Yes    Comment: occ   Drug use: Yes    Types: Marijuana, Cocaine    Comment: 3-4 days a week     Allergies   Patient has no known allergies.   Review of Systems Review of Systems Per HPI  Physical Exam Triage Vital Signs ED Triage Vitals [04/19/23 1712]  Encounter Vitals Group     BP 131/84     Systolic BP Percentile      Diastolic BP Percentile      Pulse Rate 77     Resp 18     Temp 99.1 F (37.3 C)     Temp Source Oral     SpO2 97 %     Weight      Height      Head Circumference      Peak Flow      Pain Score 7     Pain Loc      Pain Education      Exclude from Growth Chart    No data found.  Updated Vital Signs BP 131/84 (BP Location: Right Arm)   Pulse 77   Temp 99.1 F (37.3 C) (Oral)   Resp 18   LMP 04/06/2023 (Approximate)   SpO2 97%   Visual Acuity Right Eye Distance:   Left Eye Distance:   Bilateral Distance:    Right Eye Near:   Left Eye Near:    Bilateral Near:     Physical Exam Vitals and nursing note reviewed.  Constitutional:      Appearance: Normal appearance. She is not ill-appearing.  HENT:     Head: Atraumatic.     Right Ear: Tympanic membrane and external ear normal.     Left Ear: Tympanic membrane and external ear normal.     Nose:  Congestion present.     Mouth/Throat:     Mouth: Mucous membranes are moist.     Pharynx: Posterior oropharyngeal erythema present.  Eyes:     Extraocular  Movements: Extraocular movements intact.     Conjunctiva/sclera: Conjunctivae normal.  Cardiovascular:     Rate and Rhythm: Normal rate and regular rhythm.     Heart sounds: Normal heart sounds.  Pulmonary:     Effort: Pulmonary effort is normal.     Breath sounds: Normal breath sounds. No wheezing or rales.  Musculoskeletal:        General: Tenderness present. Normal range of motion.     Cervical back: Normal range of motion and neck supple.     Comments: Tender to palpation bilateral trapezius and latissimus muscles.  No midline spinal tenderness to palpation diffusely.  Range of motion and gait intact.  Grip strength full and equal bilateral hands  Skin:    General: Skin is warm and dry.  Neurological:     Mental Status: She is alert and oriented to person, place, and time.     Motor: No weakness.     Gait: Gait normal.     Comments: All 4 extremities neurovascularly intact  Psychiatric:        Mood and Affect: Mood normal.        Thought Content: Thought content normal.        Judgment: Judgment normal.     UC Treatments / Results  Labs (all labs ordered are listed, but only abnormal results are displayed) Labs Reviewed - No data to display  EKG   Radiology No results found.  Procedures Procedures (including critical care time)  Medications Ordered in UC Medications  dexamethasone (DECADRON) injection 10 mg (10 mg Intramuscular Given 04/19/23 1857)    Initial Impression / Assessment and Plan / UC Course  I have reviewed the triage vital signs and the nursing notes.  Pertinent labs & imaging results that were available during my care of the patient were reviewed by me and considered in my medical decision making (see chart for details).     Treat with IM Decadron for muscular strain as well as allergic  sinusitis.  Will also start a good consistent allergy regimen of Zyrtec and Flonase, discussed sinus rinses and over-the-counter remedies additionally.  Zanaflex for muscular soreness, massage, heat.  Work note given.  Return for worsening symptoms.  Final Clinical Impressions(s) / UC Diagnoses   Final diagnoses:  Upper back pain  Allergic sinusitis   Discharge Instructions   None    ED Prescriptions     Medication Sig Dispense Auth. Provider   tiZANidine (ZANAFLEX) 4 MG capsule Take 1 capsule (4 mg total) by mouth 3 (three) times daily as needed for muscle spasms. Do not drink alcohol or drive while taking this medication.  May cause drowsiness. 15 capsule Particia Nearing, [place]   cetirizine (ZYRTEC ALLERGY) 10 MG tablet Take 1 tablet (10 mg total) by mouth daily. 30 tablet Particia Nearing, PA-C   fluticasone [facility]) 50 MCG/ACT nasal spray Place 1 spray into both nostrils 2 (two) times daily. 16 g Particia Nearing, [place]      PDMP not reviewed this encounter.   Particia Nearing, [place] 04/19/23 Windell Moment

## 2023-04-20 ENCOUNTER — Telehealth: Payer: Self-pay

## 2023-04-20 NOTE — Telephone Encounter (Signed)
Pharmacy called to inform us that tizanidine capsules would not be cover under insurance and would like to switch to tablets instead, verbal okay was given to switch to what was cover under patients insurance.

## 2023-04-21 ENCOUNTER — Other Ambulatory Visit: Payer: Self-pay | Admitting: Family Medicine

## 2023-04-23 ENCOUNTER — Other Ambulatory Visit: Payer: Self-pay | Admitting: Family Medicine

## 2023-04-25 ENCOUNTER — Other Ambulatory Visit: Payer: Self-pay | Admitting: Family Medicine

## 2023-04-27 ENCOUNTER — Other Ambulatory Visit: Payer: Self-pay | Admitting: Family Medicine

## 2023-04-29 ENCOUNTER — Other Ambulatory Visit: Payer: Self-pay | Admitting: Family Medicine

## 2023-05-01 ENCOUNTER — Other Ambulatory Visit: Payer: Self-pay | Admitting: Family Medicine

## 2023-05-03 ENCOUNTER — Other Ambulatory Visit: Payer: Self-pay | Admitting: Family Medicine

## 2023-05-05 ENCOUNTER — Other Ambulatory Visit: Payer: Self-pay | Admitting: Family Medicine

## 2023-05-07 ENCOUNTER — Other Ambulatory Visit: Payer: Self-pay | Admitting: Family Medicine

## 2023-05-09 ENCOUNTER — Other Ambulatory Visit: Payer: Self-pay | Admitting: Family Medicine

## 2023-05-11 ENCOUNTER — Other Ambulatory Visit: Payer: Self-pay | Admitting: Family Medicine

## 2023-09-06 ENCOUNTER — Emergency Department (HOSPITAL_COMMUNITY): Admission: EM | Admit: 2023-09-06 | Discharge: 2023-09-06 | Discharge status: Against Medical Advice | Payer: MEDICAID

## 2023-09-06 NOTE — ED Notes (Signed)
 Called for Pt from waiting room X 2. No answer

## 2023-09-06 NOTE — ED Notes (Signed)
 Called for Pt X 3 from waiting room. No answer.

## 2023-12-21 ENCOUNTER — Ambulatory Visit
Admission: EM | Admit: 2023-12-21 | Discharge: 2023-12-21 | Disposition: A | Discharge status: Home / Self Care | Payer: MEDICAID | Attending: Nurse Practitioner | Admitting: Nurse Practitioner

## 2023-12-21 DIAGNOSIS — M549 Dorsalgia, unspecified: Secondary | ICD-10-CM

## 2023-12-21 DIAGNOSIS — Z8739 Personal history of other diseases of the musculoskeletal system and connective tissue: Secondary | ICD-10-CM

## 2023-12-21 MED ORDER — TIZANIDINE HCL 4 MG PO TABS: 4.0000 mg | ORAL_TABLET | Freq: Three times a day (TID) | ORAL | 0 refills | Status: AC | PRN

## 2023-12-21 MED ORDER — DEXAMETHASONE SODIUM PHOSPHATE 10 MG/ML IJ SOLN
10.0000 mg | INTRAMUSCULAR | Status: AC
  Administered 2023-12-21: 10 mg via INTRAMUSCULAR

## 2023-12-21 NOTE — ED Provider Notes (Signed)
 [facility]    CSN: [id number] Arrival date & time: 12/21/23  1656      History   Chief Complaint Chief Complaint  Patient presents with   work note    HPI Holly Hodge is a 38 y.o. female.   The history is provided by the patient.   Patient presenting today with 2-day history of bilateral upper back pain and stiffness.  Patient states that she was helping her son move to a different college over the weekend, states that she had to do a lot of heavy lifting, bending, and multiple trips back-and-forth.  Patient states that she developed tension in her upper back and shoulders.  Denies known injury. Denies any decreased range of motion, numbness, tingling, swelling or discoloration of extremities.  States she has missed the last 2 days of work, she is also requesting a work note.   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   PE (pulmonary embolism) 2013   Vaginal discharge 08/09/2014   Vaginal irritation 12/24/2014   Yeast infection 12/24/2014    Patient Active Problem List   Diagnosis Date Noted   Pregnancy test negative 11/10/2021   Pregnancy examination or test, negative result 04/21/2021   Screening examination for STD (sexually transmitted disease) 04/21/2021   Anxiety and depression 04/21/2021   Smoker 04/21/2021   Costochondritis 08/15/2019   Encounter for gynecological examination with Papanicolaou smear of cervix 08/15/2019   Vaginal irritation 08/15/2019   Depression 04/27/2018   Encounter for well woman exam with routine gynecological exam 04/27/2018   Nexplanon  insertion 12/06/2014   Abnormal Pap smear of cervix 05/21/2014   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/11/2012   Nausea and vomiting 03/09/2012   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/08/2012   Tobacco abuse 03/08/2012   Obesity 03/08/2012   Menorrhagia 03/08/2012    Past Surgical History:   Procedure Laterality Date   CESAREAN SECTION     x2   CESAREAN SECTION N/A 02/14/2014   Procedure: CESAREAN SECTION;  Surgeon: Tresia Fruit, MD;  Location: [facility];  Service: Obstetrics;  Laterality: N/A;   CHOLECYSTECTOMY  2005   WISDOM TOOTH EXTRACTION      OB History     Gravida  3   Para  3   Term  3   Preterm      AB      Living  3      SAB      IAB      Ectopic      Multiple      Live Births  3            Home Medications    Prior to Admission medications   Medication Sig Start Date End Date Taking? Authorizing Provider  tiZANidine  (ZANAFLEX ) 4 MG tablet Take 1 tablet (4 mg total) by mouth every 8 (eight) hours as needed for muscle spasms. 12/21/23  Yes Leath-Warren, Belen Bowers, NP  buPROPion  (WELLBUTRIN  XL) 150 MG 24 hr tablet Take 1 tablet (150 mg total) by mouth daily. Patient not taking: Reported on 11/10/2021 04/21/21   Javan Messing, NP  busPIRone  (BUSPAR ) 5 MG tablet Take 1 tablet (5 mg total) by mouth 3 (three) times daily. Patient not taking: Reported on 11/10/2021 04/21/21   Lendia Quay A, NP  cephALEXin  (KEFLEX ) 500 MG capsule Take 1 capsule (500 mg total) by mouth 2 (two) times daily. 04/05/23  Corbin Dess, PA-C  cetirizine  (ZYRTEC  ALLERGY) 10 MG tablet Take 1 tablet (10 mg total) by mouth daily. 04/19/23   Corbin Dess, PA-C  Chlorphen-Phenyleph-Ibuprofen  (ADVIL  ALLERGY & CONGESTION PO) Take 1 tablet by mouth daily as needed. Patient not taking: Reported on 11/10/2021    [provider]  diphenhydrAMINE  HCl (BENADRYL  PO) Take by mouth.    [provider]  fluticasone  (FLONASE ) 50 MCG/ACT nasal spray Place 1 spray into both nostrils 2 (two) times daily. 04/19/23   Corbin Dess, PA-C  medroxyPROGESTERone  (DEPO-PROVERA ) 150 MG/ML injection Inject 1 mL (150 mg total) into the muscle every 3 (three) months. Patient not taking: Reported on 11/10/2021 04/21/21   Javan Messing, NP   promethazine -dextromethorphan (PROMETHAZINE -DM) 6.25-15 MG/5ML syrup Take 5 mLs by mouth 4 (four) times daily as needed. 04/24/22   Corbin Dess, PA-C    Family History Family History  Problem Relation Age of Onset   Hypertension Mother    Hypertension Father    Stroke Father    Cancer Paternal Grandmother    Cancer Other        runs on both sides of family    Social History Social History   Tobacco Use   Smoking status: Every Day    Current packs/day: 1.00    Average packs/day: 1 pack/day for 12.0 years (12.0 ttl pk-yrs)    Types: Cigarettes   Smokeless tobacco: Never  Vaping Use   Vaping status: Never Used  Substance Use Topics   Alcohol use: Yes    Comment: occ   Drug use: Yes    Types: Marijuana, Cocaine    Comment: 3-4 days a week     Allergies   Patient has no known allergies.   Review of Systems Review of Systems Per HPI  Physical Exam Triage Vital Signs ED Triage Vitals [12/21/23 1709]  Encounter Vitals Group     BP (!) 144/84     Girls Systolic BP Percentile      Girls Diastolic BP Percentile      Boys Systolic BP Percentile      Boys Diastolic BP Percentile      Pulse Rate 95     Resp 20     Temp 98.2 F (36.8 C)     Temp Source Oral     SpO2 95 %     Weight      Height      Head Circumference      Peak Flow      Pain Score 0     Pain Loc      Pain Education      Exclude from Growth Chart    No data found.  Updated Vital Signs BP (!) 144/84 (BP Location: Right Arm)   Pulse 95   Temp 98.2 F (36.8 C) (Oral)   Resp 20   LMP 12/21/2023   SpO2 95%   Visual Acuity Right Eye Distance:   Left Eye Distance:   Bilateral Distance:    Right Eye Near:   Left Eye Near:    Bilateral Near:     Physical Exam Vitals and nursing note reviewed.  Constitutional:      General: She is not in acute distress.    Appearance: Normal appearance.  HENT:     Head: Normocephalic.   Eyes:     Extraocular Movements: Extraocular  movements intact.     Conjunctiva/sclera: Conjunctivae normal.     Pupils: Pupils are equal, round,  and reactive to light.    Cardiovascular:     Rate and Rhythm: Normal rate and regular rhythm.     Pulses: Normal pulses.     Heart sounds: Normal heart sounds.  Pulmonary:     Effort: Pulmonary effort is normal.     Breath sounds: Normal breath sounds.  Abdominal:     Palpations: Abdomen is soft.   Musculoskeletal:     Cervical back: Normal range of motion.     Thoracic back: Spasms and tenderness present. No swelling or deformity.     Comments: Tender to palpation bilateral trapezius and latissimus muscles.  No midline spinal tenderness to palpation diffusely.  Range of motion and gait intact.  Grip strength full and equal bilateral hands    Skin:    General: Skin is warm and dry.   Neurological:     General: No focal deficit present.     Mental Status: She is alert and oriented to person, place, and time.   Psychiatric:        Mood and Affect: Mood normal.        Behavior: Behavior normal.      UC Treatments / Results  Labs (all labs ordered are listed, but only abnormal results are displayed) Labs Reviewed - No data to display  EKG   Radiology No results found.  Procedures Procedures (including critical care time)  Medications Ordered in UC Medications  dexamethasone  (DECADRON ) injection 10 mg (10 mg Intramuscular Given 12/21/23 1726)    Initial Impression / Assessment and Plan / UC Course  I have reviewed the triage vital signs and the nursing notes.  Pertinent labs & imaging results that were available during my care of the patient were reviewed by me and considered in my medical decision making (see chart for details).  Patient with history of upper back pain.  Will treat for possible sprain/strain with Decadron  10 mg IM.  Will start tizanidine  4 mg for back pain or spasm.  Supportive care recommendations were provided and discussed with the patient to  include fluids, rest, the use of ice or heat, and gentle stretching and range of motion exercises.  Patient was given indications for follow-up.  Patient was in agreement with this plan of care and verbalizes understanding.  All questions were answered.  Patient stable for discharge.  Work note was provided.  Final Clinical Impressions(s) / UC Diagnoses   Final diagnoses:  Upper back pain  History of back pain     Discharge Instructions      You have been given an injection of Decadron  10 mg today.  Do not take any additional NSAIDs such as ibuprofen , Aleve , Advil , or Motrin  today. Take medication as prescribed. Try to remain as active as possible. Gentle range of motion and stretching exercises to help with back spasm and pain.  I have provided exercises for you to perform at home.  Try to perform the exercises at least 2-3 times daily while symptoms persist. May apply ice or heat as needed.  Ice is recommended for pain or swelling, heat for spasm or stiffness.  Apply for 20 minutes, remove for 1 hour, then repeat. You may take over-the-counter Tylenol  as needed for pain or discomfort. Go to the emergency department immediately if you develop weakness, numbness, tingling in your upper extremities, have difficulty breathing, or other concerns. Follow-up with your primary care physician if your symptoms do not improve.  Follow-up as needed.     ED Prescriptions  Medication Sig Dispense Auth. Provider   tiZANidine  (ZANAFLEX ) 4 MG tablet Take 1 tablet (4 mg total) by mouth every 8 (eight) hours as needed for muscle spasms. 30 tablet Leath-Warren, Belen Bowers, NP      PDMP not reviewed this encounter.   Hardy Lia, NP 12/21/23 1729

## 2023-12-21 NOTE — ED Triage Notes (Signed)
 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

## 2023-12-21 NOTE — Discharge Instructions (Addendum)
 You have been given an injection of Decadron  10 mg today.  Do not take any additional NSAIDs such as ibuprofen , Aleve , Advil , or Motrin  today. Take medication as prescribed. Try to remain as active as possible. Gentle range of motion and stretching exercises to help with back spasm and pain.  I have provided exercises for you to perform at home.  Try to perform the exercises at least 2-3 times daily while symptoms persist. May apply ice or heat as needed.  Ice is recommended for pain or swelling, heat for spasm or stiffness.  Apply for 20 minutes, remove for 1 hour, then repeat. You may take over-the-counter Tylenol  as needed for pain or discomfort. Go to the emergency department immediately if you develop weakness, numbness, tingling in your upper extremities, have difficulty breathing, or other concerns. Follow-up with your primary care physician if your symptoms do not improve.  Follow-up as needed.

## 2024-03-21 ENCOUNTER — Ambulatory Visit
Admission: EM | Admit: 2024-03-21 | Discharge: 2024-03-21 | Disposition: A | Discharge status: Home / Self Care | Payer: MEDICAID | Attending: Family Medicine | Admitting: Family Medicine

## 2024-03-21 DIAGNOSIS — S161XXA Strain of muscle, fascia and tendon at neck level, initial encounter: Secondary | ICD-10-CM | POA: Diagnosis not present

## 2024-03-21 DIAGNOSIS — R030 Elevated blood-pressure reading, without diagnosis of hypertension: Secondary | ICD-10-CM | POA: Diagnosis not present

## 2024-03-21 DIAGNOSIS — S29012A Strain of muscle and tendon of back wall of thorax, initial encounter: Secondary | ICD-10-CM | POA: Diagnosis not present

## 2024-03-21 MED ORDER — TIZANIDINE HCL 4 MG PO CAPS: 4.0000 mg | ORAL_CAPSULE | Freq: Three times a day (TID) | ORAL | 0 refills | Status: AC | PRN

## 2024-03-21 MED ORDER — AMLODIPINE BESYLATE 5 MG PO TABS: 5.0000 mg | ORAL_TABLET | Freq: Every day | ORAL | 2 refills | Status: AC

## 2024-03-21 NOTE — ED Provider Notes (Signed)
 [facility]    CSN: [id number] Arrival date & time: 03/21/24  1826      History   Chief Complaint Chief Complaint  Patient presents with   upper back pain    HPI Holly Hodge is a 38 y.o. female.   Patient presenting today with 3-day history of bilateral upper back and shoulder pain, stiffness, soreness.  States does a lot of physical work and has been under a lot of stress recently.  Denies known injury, numbness, tingling, weakness, decreased range of motion, dizziness.  Trying over-the-counter pain relievers with mild temporary benefit.  Also concerned about her blood pressure as it has been persistently elevated at multiple appointments now.  She does not routinely check blood pressures at home.  Denies chest pain, shortness of breath, dizziness, headache, visual change.  States she has never been diagnosed with hypertension in the past or been on any medications for this but does have a strong family history of hypertension.  Does smoke cigarettes.    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   PE (pulmonary embolism) 2013   Vaginal discharge 08/09/2014   Vaginal irritation 12/24/2014   Yeast infection 12/24/2014    Patient Active Problem List   Diagnosis Date Noted   Pregnancy test negative 11/10/2021   Pregnancy examination or test, negative result 04/21/2021   Screening examination for STD (sexually transmitted disease) 04/21/2021   Anxiety and depression 04/21/2021   Smoker 04/21/2021   Costochondritis 08/15/2019   Encounter for gynecological examination with Papanicolaou smear of cervix 08/15/2019   Vaginal irritation 08/15/2019   Depression 04/27/2018   Encounter for well woman exam with routine gynecological exam 04/27/2018   Nexplanon  insertion 12/06/2014   Abnormal Pap smear of cervix 05/21/2014   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/11/2012   Nausea and  vomiting 03/09/2012   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/08/2012   Tobacco abuse 03/08/2012   Obesity 03/08/2012   Menorrhagia 03/08/2012    Past Surgical History:  Procedure Laterality Date   CESAREAN SECTION     x2   CESAREAN SECTION N/A 02/14/2014   Procedure: CESAREAN SECTION;  Surgeon: Lynwood KANDICE Solomons, MD;  Location: [facility];  Service: Obstetrics;  Laterality: N/A;   CHOLECYSTECTOMY  2005   WISDOM TOOTH EXTRACTION      OB History     Gravida  3   Para  3   Term  3   Preterm      AB      Living  3      SAB      IAB      Ectopic      Multiple      Live Births  3            Home Medications    Prior to Admission medications   Medication Sig Start Date End Date Taking? Authorizing Provider  amLODipine  (NORVASC ) 5 MG tablet Take 1 tablet (5 mg total) by mouth daily. 03/21/24  Yes Stuart Vernell Norris, PA-C  tiZANidine  (ZANAFLEX ) 4 MG capsule Take 1 capsule (4 mg total) by mouth 3 (three) times daily as needed for muscle spasms. Do not drink alcohol or drive while taking this medication.  May cause drowsiness. 03/21/24  Yes Stuart Vernell Norris, PA-C  buPROPion  (WELLBUTRIN  XL) 150 MG 24 hr tablet Take 1 tablet (150 mg total) by mouth daily. Patient not taking: Reported on  11/10/2021 04/21/21   Signa Delon LABOR, NP  busPIRone  (BUSPAR ) 5 MG tablet Take 1 tablet (5 mg total) by mouth 3 (three) times daily. Patient not taking: Reported on 11/10/2021 04/21/21   Signa Delon LABOR, NP  cephALEXin  (KEFLEX ) 500 MG capsule Take 1 capsule (500 mg total) by mouth 2 (two) times daily. 04/05/23   Stuart Vernell Norris, PA-C  cetirizine  (ZYRTEC  ALLERGY) 10 MG tablet Take 1 tablet (10 mg total) by mouth daily. 04/19/23   Stuart Vernell Norris, PA-C  Chlorphen-Phenyleph-Ibuprofen  (ADVIL  ALLERGY & CONGESTION PO) Take 1 tablet by mouth daily as needed. Patient not taking: Reported on 11/10/2021    [provider]  diphenhydrAMINE  HCl (BENADRYL  PO) Take by  mouth.    [provider]  fluticasone  (FLONASE ) 50 MCG/ACT nasal spray Place 1 spray into both nostrils 2 (two) times daily. 04/19/23   Stuart Vernell Norris, PA-C  medroxyPROGESTERone  (DEPO-PROVERA ) 150 MG/ML injection Inject 1 mL (150 mg total) into the muscle every 3 (three) months. Patient not taking: Reported on 11/10/2021 04/21/21   Signa Delon LABOR, NP  promethazine -dextromethorphan (PROMETHAZINE -DM) 6.25-15 MG/5ML syrup Take 5 mLs by mouth 4 (four) times daily as needed. 04/24/22   Stuart Vernell Norris, PA-C  tiZANidine  (ZANAFLEX ) 4 MG tablet Take 1 tablet (4 mg total) by mouth every 8 (eight) hours as needed for muscle spasms. 12/21/23   Leath-Warren, Etta PARAS, NP    Family History Family History  Problem Relation Age of Onset   Hypertension Mother    Hypertension Father    Stroke Father    Cancer Paternal Grandmother    Cancer Other        runs on both sides of family    Social History Social History   Tobacco Use   Smoking status: Every Day    Current packs/day: 1.00    Average packs/day: 1 pack/day for 12.0 years (12.0 ttl pk-yrs)    Types: Cigarettes   Smokeless tobacco: Never  Vaping Use   Vaping status: Never Used  Substance Use Topics   Alcohol use: Yes    Comment: occ   Drug use: Yes    Types: Marijuana, Cocaine    Comment: 3-4 days a week     Allergies   Patient has no known allergies.   Review of Systems Review of Systems Per HPI  Physical Exam Triage Vital Signs ED Triage Vitals  Encounter Vitals Group     BP 03/21/24 1836 (!) 169/104     Girls Systolic BP Percentile --      Girls Diastolic BP Percentile --      Boys Systolic BP Percentile --      Boys Diastolic BP Percentile --      Pulse Rate 03/21/24 1838 100     Resp 03/21/24 1836 16     Temp 03/21/24 1836 99.4 F (37.4 C)     Temp Source 03/21/24 1836 Oral     SpO2 03/21/24 1836 97 %     Weight --      Height --      Head Circumference --      Peak Flow --       Pain Score 03/21/24 1838 7     Pain Loc --      Pain Education --      Exclude from Growth Chart --    No data found.  Updated Vital Signs BP (!) 147/100 (BP Location: Right Arm) Comment: second attempt  Pulse 100   Temp 99.4  F (37.4 C) (Oral)   Resp 16   LMP 03/07/2024 (Approximate)   SpO2 97%   Visual Acuity Right Eye Distance:   Left Eye Distance:   Bilateral Distance:    Right Eye Near:   Left Eye Near:    Bilateral Near:     Physical Exam Vitals and nursing note reviewed.  Constitutional:      Appearance: Normal appearance. She is not ill-appearing.  HENT:     Head: Atraumatic.  Eyes:     Extraocular Movements: Extraocular movements intact.     Conjunctiva/sclera: Conjunctivae normal.  Cardiovascular:     Rate and Rhythm: Normal rate and regular rhythm.     Heart sounds: Normal heart sounds.  Pulmonary:     Effort: Pulmonary effort is normal.     Breath sounds: Normal breath sounds.  Musculoskeletal:        General: Tenderness present. Normal range of motion.     Cervical back: Normal range of motion and neck supple.     Comments: No midline spinal tenderness to palpation diffusely.  Bilateral upper back musculature and cervical paraspinal musculature tender to palpation and mild spasm.  Grip strength full and equal bilateral hands.  Normal range of motion and gait  Skin:    General: Skin is warm and dry.  Neurological:     Mental Status: She is alert and oriented to person, place, and time.     Comments: Bilateral upper extremities neurovascular intact  Psychiatric:        Mood and Affect: Mood normal.        Thought Content: Thought content normal.        Judgment: Judgment normal.      UC Treatments / Results  Labs (all labs ordered are listed, but only abnormal results are displayed) Labs Reviewed - No data to display  EKG   Radiology No results found.  Procedures Procedures (including critical care time)  Medications Ordered in  UC Medications - No data to display  Initial Impression / Assessment and Plan / UC Course  I have reviewed the triage vital signs and the nursing notes.  Pertinent labs & imaging results that were available during my care of the patient were reviewed by me and considered in my medical decision making (see chart for details).     Regarding her persistently elevated blood pressure readings, she currently does not have a primary care provider.  Resources given to establish care as soon as possible with a primary care provider and will start on low-dose amlodipine  in the meantime.  Monitor and log home blood pressure readings, discussed lifestyle modifications to help additionally.  Regarding her muscular upper back and neck pain, will treat with Zanaflex , heat, size, stretches, over-the-counter pain relievers.  Work note given.  Return for worsening symptoms.  Final Clinical Impressions(s) / UC Diagnoses   Final diagnoses:  Upper back strain, initial encounter  Strain of neck muscle, initial encounter  Elevated blood pressure reading     Discharge Instructions      I have prescribed a muscle relaxer for your muscular upper back pain and neck pain.  You may also take over-the-counter pain relievers as needed, use heating pads, muscle rubs, massage, gentle stretches and make sure to be practicing good posture.  Regarding your persistently elevated blood pressure readings, I have started you on a low-dose of blood pressure medication and it is important that you monitor your home blood pressure readings as much as you are  able to and follow-up with a new primary care provider as soon as possible.  Healthy diet, good exercise, stress reduction as able is also important.    In order to establish care with a primary care, you may go to the [facility] health website and click on the find a provider tab and you can get this appointment set up online.    ED Prescriptions     Medication Sig Dispense  Auth. Provider   tiZANidine  (ZANAFLEX ) 4 MG capsule Take 1 capsule (4 mg total) by mouth 3 (three) times daily as needed for muscle spasms. Do not drink alcohol or drive while taking this medication.  May cause drowsiness. 15 capsule Stuart Vernell Norris, PA-C   amLODipine  (NORVASC ) 5 MG tablet Take 1 tablet (5 mg total) by mouth daily. 30 tablet Stuart Vernell Norris, [place]      PDMP not reviewed this encounter.   Stuart Vernell Norris, [place] 03/21/24 1930

## 2024-03-21 NOTE — ED Triage Notes (Signed)
 Pt report to UC due to having upper back pain radiating to her neck x 3 days

## 2024-03-21 NOTE — Discharge Instructions (Addendum)
 I have prescribed a muscle relaxer for your muscular upper back pain and neck pain.  You may also take over-the-counter pain relievers as needed, use heating pads, muscle rubs, massage, gentle stretches and make sure to be practicing good posture.  Regarding your persistently elevated blood pressure readings, I have started you on a low-dose of blood pressure medication and it is important that you monitor your home blood pressure readings as much as you are able to and follow-up with a new primary care provider as soon as possible.  Healthy diet, good exercise, stress reduction as able is also important.    In order to establish care with a primary care, you may go to the [facility] health website and click on the find a provider tab and you can get this appointment set up online.

## 2024-07-11 ENCOUNTER — Ambulatory Visit
Admission: EM | Admit: 2024-07-11 | Discharge: 2024-07-11 | Disposition: A | Discharge status: Home / Self Care | Payer: MEDICAID | Attending: Family Medicine | Admitting: Family Medicine

## 2024-07-11 DIAGNOSIS — H1132 Conjunctival hemorrhage, left eye: Secondary | ICD-10-CM | POA: Diagnosis not present

## 2024-07-11 MED ORDER — ERYTHROMYCIN 5 MG/GM OP OINT: TOPICAL_OINTMENT | OPHTHALMIC | 0 refills | Status: AC

## 2024-07-11 NOTE — ED Triage Notes (Signed)
 Pt reports she has left eye redness and slight blurred vision x 2 days     Unsure if injury occurred to left eye

## 2024-07-11 NOTE — ED Provider Notes (Signed)
 " [facility]    CSN: [id number] Arrival date & time: 07/11/24  1254      History   Chief Complaint No chief complaint on file.   HPI Holly Hodge is a 39 y.o. female.   Patient presenting today with 2-day history of left eye redness, irritation.  States she was vomiting and crying just prior to onset and is unsure if this caused an irritation.  Denies other known injury to the eye, visual change, headache, nausea, vomiting.  So far not trying thing over-the-counter for symptoms.    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   PE (pulmonary embolism) 2013   Vaginal discharge 08/09/2014   Vaginal irritation 12/24/2014   Yeast infection 12/24/2014    Patient Active Problem List   Diagnosis Date Noted   Pregnancy test negative 11/10/2021   Pregnancy examination or test, negative result 04/21/2021   Screening examination for STD (sexually transmitted disease) 04/21/2021   Anxiety and depression 04/21/2021   Smoker 04/21/2021   Costochondritis 08/15/2019   Encounter for gynecological examination with Papanicolaou smear of cervix 08/15/2019   Vaginal irritation 08/15/2019   Depression 04/27/2018   Encounter for well woman exam with routine gynecological exam 04/27/2018   Nexplanon  insertion 12/06/2014   Abnormal Pap smear of cervix 05/21/2014   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/11/2012   Nausea and vomiting 03/09/2012   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/08/2012   Tobacco abuse 03/08/2012   Obesity 03/08/2012   Menorrhagia 03/08/2012    Past Surgical History:  Procedure Laterality Date   CESAREAN SECTION     x2   CESAREAN SECTION N/A 02/14/2014   Procedure: CESAREAN SECTION;  Surgeon: Lynwood KANDICE Solomons, MD;  Location: [facility];  Service: Obstetrics;  Laterality: N/A;   CHOLECYSTECTOMY  2005   WISDOM TOOTH EXTRACTION      OB History     Gravida  3   Para  3    Term  3   Preterm      AB      Living  3      SAB      IAB      Ectopic      Multiple      Live Births  3            Home Medications    Prior to Admission medications  Medication Sig Start Date End Date Taking? Authorizing Provider  erythromycin  ophthalmic ointment Place a 1/2 inch ribbon of ointment into the left lower eyelid BID prn. 07/11/24  Yes Stuart Vernell Norris, PA-C  amLODipine  (NORVASC ) 5 MG tablet Take 1 tablet (5 mg total) by mouth daily. 03/21/24   Stuart Vernell Norris, PA-C  buPROPion  (WELLBUTRIN  XL) 150 MG 24 hr tablet Take 1 tablet (150 mg total) by mouth daily. Patient not taking: Reported on 11/10/2021 04/21/21   Signa Nest A, NP  busPIRone  (BUSPAR ) 5 MG tablet Take 1 tablet (5 mg total) by mouth 3 (three) times daily. Patient not taking: Reported on 11/10/2021 04/21/21   Signa Nest A, NP  cephALEXin  (KEFLEX ) 500 MG capsule Take 1 capsule (500 mg total) by mouth 2 (two) times daily. 04/05/23   Stuart Vernell Norris, PA-C  cetirizine  (ZYRTEC  ALLERGY) 10 MG tablet Take 1 tablet (10 mg total) by mouth daily. 04/19/23   Stuart Vernell Norris, PA-C  Chlorphen-Phenyleph-Ibuprofen  (ADVIL  ALLERGY & CONGESTION PO) Take 1 tablet  by mouth daily as needed. Patient not taking: Reported on 11/10/2021    [provider]  diphenhydrAMINE  HCl (BENADRYL  PO) Take by mouth.    [provider]  fluticasone  (FLONASE ) 50 MCG/ACT nasal spray Place 1 spray into both nostrils 2 (two) times daily. 04/19/23   Stuart Vernell Norris, PA-C  medroxyPROGESTERone  (DEPO-PROVERA ) 150 MG/ML injection Inject 1 mL (150 mg total) into the muscle every 3 (three) months. Patient not taking: Reported on 11/10/2021 04/21/21   Signa Delon LABOR, NP  promethazine -dextromethorphan (PROMETHAZINE -DM) 6.25-15 MG/5ML syrup Take 5 mLs by mouth 4 (four) times daily as needed. 04/24/22   Stuart Vernell Norris, PA-C  tiZANidine  (ZANAFLEX ) 4 MG capsule Take 1 capsule (4 mg  total) by mouth 3 (three) times daily as needed for muscle spasms. Do not drink alcohol or drive while taking this medication.  May cause drowsiness. 03/21/24   Stuart Vernell Norris, PA-C  tiZANidine  (ZANAFLEX ) 4 MG tablet Take 1 tablet (4 mg total) by mouth every 8 (eight) hours as needed for muscle spasms. 12/21/23   Leath-Warren, Etta PARAS, NP    Family History Family History  Problem Relation Age of Onset   Hypertension Mother    Hypertension Father    Stroke Father    Cancer Paternal Grandmother    Cancer Other        runs on both sides of family    Social History Social History[1]   Allergies   Patient has no known allergies.   Review of Systems Review of Systems PER HPI  Physical Exam Triage Vital Signs ED Triage Vitals  Encounter Vitals Group     BP 07/11/24 1301 (!) 144/95     Girls Systolic BP Percentile --      Girls Diastolic BP Percentile --      Boys Systolic BP Percentile --      Boys Diastolic BP Percentile --      Pulse Rate 07/11/24 1301 80     Resp 07/11/24 1301 16     Temp 07/11/24 1301 98.9 F (37.2 C)     Temp Source 07/11/24 1301 Oral     SpO2 07/11/24 1301 96 %     Weight --      Height --      Head Circumference --      Peak Flow --      Pain Score 07/11/24 1300 3     Pain Loc --      Pain Education --      Exclude from Growth Chart --    No data found.  Updated Vital Signs BP (!) 144/95 (BP Location: Right Arm)   Pulse 80   Temp 98.9 F (37.2 C) (Oral)   Resp 16   LMP 07/09/2024 (Approximate)   SpO2 96%   Visual Acuity Right Eye Distance:   Left Eye Distance:   Bilateral Distance:    Right Eye Near:   Left Eye Near:    Bilateral Near:     Physical Exam Vitals and nursing note reviewed.  Constitutional:      Appearance: Normal appearance. She is not ill-appearing.  HENT:     Head: Atraumatic.     Mouth/Throat:     Mouth: Mucous membranes are moist.     Pharynx: Oropharynx is clear.  Eyes:     Extraocular  Movements: Extraocular movements intact.     Pupils: Pupils are equal, round, and reactive to light.     Comments: Left subconjunctival hematoma  Cardiovascular:  Rate and Rhythm: Normal rate.  Pulmonary:     Effort: Pulmonary effort is normal.  Musculoskeletal:        General: Normal range of motion.     Cervical back: Normal range of motion and neck supple.  Skin:    General: Skin is warm and dry.  Neurological:     Mental Status: She is alert and oriented to person, place, and time.  Psychiatric:        Mood and Affect: Mood normal.        Thought Content: Thought content normal.        Judgment: Judgment normal.    UC Treatments / Results  Labs (all labs ordered are listed, but only abnormal results are displayed) Labs Reviewed - No data to display  EKG   Radiology No results found.  Procedures Procedures (including critical care time)  Medications Ordered in UC Medications - No data to display  Initial Impression / Assessment and Plan / UC Course  I have reviewed the triage vital signs and the nursing notes.  Pertinent labs & imaging results that were available during my care of the patient were reviewed by me and considered in my medical decision making (see chart for details).     Visual acuity declined as vision intact per patient.  Consistent with subconjunctival hematoma.  Treat with erythromycin  ointment for comfort and protection against infection, cool compresses, over-the-counter pain relievers as needed.  Return for worsening or unresolving symptoms.  Final Clinical Impressions(s) / UC Diagnoses   Final diagnoses:  Subconjunctival hemorrhage, left   Discharge Instructions   None    ED Prescriptions     Medication Sig Dispense Auth. Provider   erythromycin  ophthalmic ointment Place a 1/2 inch ribbon of ointment into the left lower eyelid BID prn. 3.5 g Stuart Vernell Norris, PA-C      PDMP not reviewed this encounter.    [1]  Social  History Tobacco Use   Smoking status: Every Day    Current packs/day: 1.00    Average packs/day: 1 pack/day for 12.0 years (12.0 ttl pk-yrs)    Types: Cigarettes   Smokeless tobacco: Never  Vaping Use   Vaping status: Never Used  Substance Use Topics   Alcohol use: Yes    Comment: occ   Drug use: Yes    Types: Marijuana, Cocaine    Comment: 3-4 days a week     Stuart Vernell Norris, PA-C 07/11/24 1500  "
